# Patient Record
Sex: Female | Born: 1935 | Race: White | Hispanic: No | Marital: Married | Smoking: Never smoker
Health system: Southern US, Community
[De-identification: ages and names within clinical notes are randomized; demographics above are authoritative.]

## PROBLEM LIST (undated history)

## (undated) DIAGNOSIS — K219 Gastro-esophageal reflux disease without esophagitis: Secondary | ICD-10-CM

## (undated) DIAGNOSIS — I4719 Other supraventricular tachycardia: Secondary | ICD-10-CM

## (undated) DIAGNOSIS — K5900 Constipation, unspecified: Secondary | ICD-10-CM

## (undated) DIAGNOSIS — M1990 Unspecified osteoarthritis, unspecified site: Secondary | ICD-10-CM

## (undated) DIAGNOSIS — H3530 Unspecified macular degeneration: Secondary | ICD-10-CM

## (undated) DIAGNOSIS — R51 Headache: Secondary | ICD-10-CM

## (undated) DIAGNOSIS — Z8669 Personal history of other diseases of the nervous system and sense organs: Secondary | ICD-10-CM

## (undated) DIAGNOSIS — E039 Hypothyroidism, unspecified: Secondary | ICD-10-CM

## (undated) DIAGNOSIS — M7120 Synovial cyst of popliteal space [Baker], unspecified knee: Secondary | ICD-10-CM

## (undated) DIAGNOSIS — F0390 Unspecified dementia without behavioral disturbance: Secondary | ICD-10-CM

## (undated) DIAGNOSIS — Z8739 Personal history of other diseases of the musculoskeletal system and connective tissue: Secondary | ICD-10-CM

## (undated) DIAGNOSIS — I471 Supraventricular tachycardia: Secondary | ICD-10-CM

## (undated) DIAGNOSIS — I4891 Unspecified atrial fibrillation: Secondary | ICD-10-CM

## (undated) DIAGNOSIS — F0280 Dementia in other diseases classified elsewhere without behavioral disturbance: Secondary | ICD-10-CM

## (undated) HISTORY — PX: TONSILLECTOMY: SUR1361

## (undated) HISTORY — DX: Unspecified atrial fibrillation: I48.91

## (undated) HISTORY — PX: EXCISION MORTON'S NEUROMA: SHX5013

## (undated) HISTORY — PX: WRIST SURGERY: SHX841

## (undated) HISTORY — DX: Other supraventricular tachycardia: I47.19

## (undated) HISTORY — PX: OTHER SURGICAL HISTORY: SHX169

## (undated) HISTORY — DX: Synovial cyst of popliteal space (Baker), unspecified knee: M71.20

## (undated) HISTORY — PX: EYE SURGERY: SHX253

## (undated) HISTORY — DX: Supraventricular tachycardia: I47.1

## (undated) HISTORY — PX: DOPPLER ECHOCARDIOGRAPHY: SHX263

---

## 1998-05-14 ENCOUNTER — Other Ambulatory Visit: Admission: RE | Admit: 1998-05-14 | Discharge: 1998-05-14 | Payer: Self-pay | Admitting: Gynecology

## 1999-01-07 ENCOUNTER — Ambulatory Visit (HOSPITAL_COMMUNITY): Admission: RE | Admit: 1999-01-07 | Discharge: 1999-01-07 | Payer: Self-pay | Admitting: Cardiology

## 1999-01-07 ENCOUNTER — Encounter: Payer: Self-pay | Admitting: Cardiology

## 1999-07-09 ENCOUNTER — Encounter: Admission: RE | Admit: 1999-07-09 | Discharge: 1999-07-09 | Payer: Self-pay | Admitting: Gynecology

## 1999-07-09 ENCOUNTER — Encounter: Payer: Self-pay | Admitting: Gynecology

## 1999-08-21 ENCOUNTER — Other Ambulatory Visit: Admission: RE | Admit: 1999-08-21 | Discharge: 1999-08-21 | Payer: Self-pay | Admitting: Gynecology

## 2000-07-24 ENCOUNTER — Ambulatory Visit (HOSPITAL_COMMUNITY): Admission: RE | Admit: 2000-07-24 | Discharge: 2000-07-24 | Payer: Self-pay | Admitting: *Deleted

## 2000-09-22 ENCOUNTER — Encounter: Admission: RE | Admit: 2000-09-22 | Discharge: 2000-09-22 | Payer: Self-pay | Admitting: Gynecology

## 2000-09-22 ENCOUNTER — Encounter: Payer: Self-pay | Admitting: Gynecology

## 2000-10-12 ENCOUNTER — Other Ambulatory Visit: Admission: RE | Admit: 2000-10-12 | Discharge: 2000-10-12 | Payer: Self-pay | Admitting: Gynecology

## 2004-07-08 ENCOUNTER — Other Ambulatory Visit: Admission: RE | Admit: 2004-07-08 | Discharge: 2004-07-08 | Payer: Self-pay | Admitting: Gynecology

## 2011-08-25 ENCOUNTER — Other Ambulatory Visit: Payer: Self-pay | Admitting: Family Medicine

## 2011-08-25 DIAGNOSIS — R51 Headache: Secondary | ICD-10-CM

## 2011-08-25 DIAGNOSIS — J329 Chronic sinusitis, unspecified: Secondary | ICD-10-CM

## 2011-08-28 ENCOUNTER — Ambulatory Visit
Admission: RE | Admit: 2011-08-28 | Discharge: 2011-08-28 | Disposition: A | Discharge status: Home / Self Care | Payer: Medicare Other | Source: Ambulatory Visit | Attending: Family Medicine | Admitting: Family Medicine

## 2011-08-28 DIAGNOSIS — J329 Chronic sinusitis, unspecified: Secondary | ICD-10-CM

## 2011-08-28 DIAGNOSIS — R51 Headache: Secondary | ICD-10-CM

## 2012-01-30 ENCOUNTER — Encounter (INDEPENDENT_AMBULATORY_CARE_PROVIDER_SITE_OTHER): Payer: Medicare Other | Admitting: Ophthalmology

## 2012-01-30 DIAGNOSIS — H43819 Vitreous degeneration, unspecified eye: Secondary | ICD-10-CM

## 2012-01-30 DIAGNOSIS — H35379 Puckering of macula, unspecified eye: Secondary | ICD-10-CM

## 2012-01-30 DIAGNOSIS — H3530 Unspecified macular degeneration: Secondary | ICD-10-CM

## 2012-01-30 DIAGNOSIS — H2510 Age-related nuclear cataract, unspecified eye: Secondary | ICD-10-CM

## 2012-02-13 ENCOUNTER — Other Ambulatory Visit: Payer: Self-pay | Admitting: Family Medicine

## 2012-02-13 DIAGNOSIS — Z1231 Encounter for screening mammogram for malignant neoplasm of breast: Secondary | ICD-10-CM

## 2012-02-20 ENCOUNTER — Other Ambulatory Visit (HOSPITAL_COMMUNITY): Payer: Self-pay | Admitting: Orthopedic Surgery

## 2012-02-20 DIAGNOSIS — R102 Pelvic and perineal pain: Secondary | ICD-10-CM

## 2012-02-27 ENCOUNTER — Encounter (HOSPITAL_COMMUNITY)
Admission: RE | Admit: 2012-02-27 | Discharge: 2012-02-27 | Disposition: A | Discharge status: Home / Self Care | Payer: Medicare Other | Source: Ambulatory Visit | Attending: Orthopedic Surgery | Admitting: Orthopedic Surgery

## 2012-02-27 DIAGNOSIS — R102 Pelvic and perineal pain: Secondary | ICD-10-CM

## 2012-02-27 DIAGNOSIS — N949 Unspecified condition associated with female genital organs and menstrual cycle: Secondary | ICD-10-CM | POA: Insufficient documentation

## 2012-02-27 MED ORDER — TECHNETIUM TC 99M MEDRONATE IV KIT
25.0000 | PACK | Freq: Once | INTRAVENOUS | Status: AC | PRN
  Administered 2012-02-27: 25 via INTRAVENOUS

## 2012-03-05 ENCOUNTER — Ambulatory Visit: Payer: Medicare Other

## 2012-05-03 ENCOUNTER — Other Ambulatory Visit: Payer: Self-pay | Admitting: Family Medicine

## 2012-05-03 DIAGNOSIS — R609 Edema, unspecified: Secondary | ICD-10-CM

## 2012-05-03 DIAGNOSIS — R52 Pain, unspecified: Secondary | ICD-10-CM

## 2012-05-07 ENCOUNTER — Ambulatory Visit
Admission: RE | Admit: 2012-05-07 | Discharge: 2012-05-07 | Disposition: A | Discharge status: Home / Self Care | Payer: Medicare Other | Source: Ambulatory Visit | Attending: Family Medicine | Admitting: Family Medicine

## 2012-05-07 DIAGNOSIS — R52 Pain, unspecified: Secondary | ICD-10-CM

## 2012-05-07 DIAGNOSIS — R609 Edema, unspecified: Secondary | ICD-10-CM

## 2012-06-21 ENCOUNTER — Other Ambulatory Visit: Payer: Self-pay | Admitting: Orthopedic Surgery

## 2012-07-06 ENCOUNTER — Encounter (HOSPITAL_BASED_OUTPATIENT_CLINIC_OR_DEPARTMENT_OTHER): Payer: Self-pay | Admitting: *Deleted

## 2012-07-06 NOTE — Progress Notes (Signed)
Pt instructed npo p mn 1/15 x synthroid, prilosec w sip of water.  To [facility] 1/16@ 1030.  Needs hgb, ? ekg on arrival.  Pt states she saw Dr. Perlie Mayo at [facility] last yr for sinus arrythmia.  Last office note, most recent ekg, cardiac studies requested from Dr. Renaye Rakers office.

## 2012-07-08 ENCOUNTER — Encounter (HOSPITAL_BASED_OUTPATIENT_CLINIC_OR_DEPARTMENT_OTHER): Payer: Self-pay

## 2012-07-08 ENCOUNTER — Ambulatory Visit (HOSPITAL_BASED_OUTPATIENT_CLINIC_OR_DEPARTMENT_OTHER)
Admission: RE | Admit: 2012-07-08 | Discharge: 2012-07-08 | Disposition: A | Discharge status: Home / Self Care | Payer: Medicare Other | Source: Ambulatory Visit | Attending: Orthopedic Surgery | Admitting: Orthopedic Surgery

## 2012-07-08 ENCOUNTER — Encounter (HOSPITAL_BASED_OUTPATIENT_CLINIC_OR_DEPARTMENT_OTHER): Payer: Self-pay | Admitting: Anesthesiology

## 2012-07-08 ENCOUNTER — Encounter (HOSPITAL_BASED_OUTPATIENT_CLINIC_OR_DEPARTMENT_OTHER)
Admission: RE | Disposition: A | Discharge status: Home / Self Care | Payer: Self-pay | Source: Ambulatory Visit | Attending: Orthopedic Surgery

## 2012-07-08 ENCOUNTER — Ambulatory Visit (HOSPITAL_BASED_OUTPATIENT_CLINIC_OR_DEPARTMENT_OTHER): Payer: Medicare Other | Admitting: Anesthesiology

## 2012-07-08 DIAGNOSIS — E039 Hypothyroidism, unspecified: Secondary | ICD-10-CM | POA: Insufficient documentation

## 2012-07-08 DIAGNOSIS — Z79899 Other long term (current) drug therapy: Secondary | ICD-10-CM | POA: Insufficient documentation

## 2012-07-08 DIAGNOSIS — K219 Gastro-esophageal reflux disease without esophagitis: Secondary | ICD-10-CM | POA: Insufficient documentation

## 2012-07-08 DIAGNOSIS — M23302 Other meniscus derangements, unspecified lateral meniscus, unspecified knee: Secondary | ICD-10-CM | POA: Insufficient documentation

## 2012-07-08 DIAGNOSIS — M1710 Unilateral primary osteoarthritis, unspecified knee: Secondary | ICD-10-CM | POA: Insufficient documentation

## 2012-07-08 DIAGNOSIS — Z7982 Long term (current) use of aspirin: Secondary | ICD-10-CM | POA: Insufficient documentation

## 2012-07-08 DIAGNOSIS — Z9889 Other specified postprocedural states: Secondary | ICD-10-CM

## 2012-07-08 DIAGNOSIS — M23305 Other meniscus derangements, unspecified medial meniscus, unspecified knee: Secondary | ICD-10-CM | POA: Insufficient documentation

## 2012-07-08 HISTORY — DX: Unspecified macular degeneration: H35.30

## 2012-07-08 HISTORY — DX: Personal history of other diseases of the musculoskeletal system and connective tissue: Z87.39

## 2012-07-08 HISTORY — PX: KNEE ARTHROSCOPY WITH LATERAL MENISECTOMY: SHX6193

## 2012-07-08 HISTORY — DX: Hypothyroidism, unspecified: E03.9

## 2012-07-08 HISTORY — PX: KNEE ARTHROSCOPY WITH MEDIAL MENISECTOMY: SHX5651

## 2012-07-08 HISTORY — DX: Headache: R51

## 2012-07-08 HISTORY — DX: Constipation, unspecified: K59.00

## 2012-07-08 HISTORY — DX: Unspecified osteoarthritis, unspecified site: M19.90

## 2012-07-08 HISTORY — DX: Personal history of other diseases of the nervous system and sense organs: Z86.69

## 2012-07-08 HISTORY — DX: Gastro-esophageal reflux disease without esophagitis: K21.9

## 2012-07-08 SURGERY — ARTHROSCOPY, KNEE, WITH LATERAL MENISCECTOMY
Anesthesia: General | Site: Knee | Laterality: Right | Wound class: Clean

## 2012-07-08 MED ORDER — LIDOCAINE HCL (CARDIAC) 20 MG/ML IV SOLN
INTRAVENOUS | Status: DC | PRN
  Administered 2012-07-08: 50 mg via INTRAVENOUS

## 2012-07-08 MED ORDER — POVIDONE-IODINE 7.5 % EX SOLN
Freq: Once | CUTANEOUS | Status: DC
  Filled 2012-07-08: qty 118

## 2012-07-08 MED ORDER — PROMETHAZINE HCL 25 MG/ML IJ SOLN
6.2500 mg | INTRAMUSCULAR | Status: DC | PRN
  Filled 2012-07-08: qty 1

## 2012-07-08 MED ORDER — ONDANSETRON HCL 4 MG/2ML IJ SOLN
INTRAMUSCULAR | Status: DC | PRN
  Administered 2012-07-08: 4 mg via INTRAVENOUS

## 2012-07-08 MED ORDER — SODIUM CHLORIDE 0.9 % IR SOLN
Status: DC | PRN
  Administered 2012-07-08: 13:00:00

## 2012-07-08 MED ORDER — FENTANYL CITRATE 0.05 MG/ML IJ SOLN
25.0000 ug | INTRAMUSCULAR | Status: DC | PRN
  Administered 2012-07-08 (×2): 25 ug via INTRAVENOUS
  Filled 2012-07-08: qty 1

## 2012-07-08 MED ORDER — ACETAMINOPHEN 10 MG/ML IV SOLN
1000.0000 mg | Freq: Once | INTRAVENOUS | Status: DC | PRN
  Filled 2012-07-08: qty 100

## 2012-07-08 MED ORDER — LACTATED RINGERS IV SOLN
INTRAVENOUS | Status: DC | PRN
  Administered 2012-07-08 (×2): via INTRAVENOUS

## 2012-07-08 MED ORDER — LACTATED RINGERS IV SOLN
INTRAVENOUS | Status: DC
  Administered 2012-07-08: 11:00:00 via INTRAVENOUS
  Filled 2012-07-08: qty 1000

## 2012-07-08 MED ORDER — PROPOFOL 10 MG/ML IV BOLUS
INTRAVENOUS | Status: DC | PRN
  Administered 2012-07-08: 130 mg via INTRAVENOUS
  Administered 2012-07-08: 50 mg via INTRAVENOUS

## 2012-07-08 MED ORDER — MEPERIDINE HCL 50 MG PO TABS: 50.0000 mg | ORAL_TABLET | ORAL | Status: DC | PRN

## 2012-07-08 MED ORDER — DEXAMETHASONE SODIUM PHOSPHATE 4 MG/ML IJ SOLN
INTRAMUSCULAR | Status: DC | PRN
  Administered 2012-07-08: 4 mg via INTRAVENOUS

## 2012-07-08 MED ORDER — FENTANYL CITRATE 0.05 MG/ML IJ SOLN
INTRAMUSCULAR | Status: DC | PRN
Start: 2012-07-08 — End: 2012-07-08
  Administered 2012-07-08 (×4): 25 ug via INTRAVENOUS

## 2012-07-08 MED ORDER — MEPERIDINE HCL 25 MG/ML IJ SOLN
6.2500 mg | INTRAMUSCULAR | Status: DC | PRN
  Filled 2012-07-08: qty 1

## 2012-07-08 MED ORDER — MEPERIDINE HCL 50 MG PO TABS
50.0000 mg | ORAL_TABLET | ORAL | Status: DC | PRN
  Administered 2012-07-08: 50 mg via ORAL
  Filled 2012-07-08: qty 1

## 2012-07-08 MED ORDER — EPHEDRINE SULFATE 50 MG/ML IJ SOLN
INTRAMUSCULAR | Status: DC | PRN
  Administered 2012-07-08 (×2): 5 mg via INTRAVENOUS

## 2012-07-08 MED ORDER — BUPIVACAINE-EPINEPHRINE 0.5% -1:200000 IJ SOLN
INTRAMUSCULAR | Status: DC | PRN
  Administered 2012-07-08: 20 mL

## 2012-07-08 SURGICAL SUPPLY — 51 items
BANDAGE ELASTIC 6 VELCRO ST LF (GAUZE/BANDAGES/DRESSINGS) ×2 IMPLANT
BANDAGE ESMARK 6X9 LF (GAUZE/BANDAGES/DRESSINGS) ×1 IMPLANT
BANDAGE GAUZE ELAST BULKY 4 IN (GAUZE/BANDAGES/DRESSINGS) ×2 IMPLANT
BLADE 4.2CUDA (BLADE) IMPLANT
BLADE CUDA 5.5 (BLADE) IMPLANT
BLADE CUDA SHAVER 3.5 (BLADE) ×2 IMPLANT
BLADE CUTTER GATOR 3.5 (BLADE) IMPLANT
BLADE GREAT WHITE 4.2 (BLADE) IMPLANT
BNDG ESMARK 6X9 LF (GAUZE/BANDAGES/DRESSINGS) ×2
CANISTER SUCT LVC 12 LTR MEDI- (MISCELLANEOUS) ×4 IMPLANT
CANISTER SUCTION 1200CC (MISCELLANEOUS) ×2 IMPLANT
CLOTH BEACON ORANGE TIMEOUT ST (SAFETY) ×2 IMPLANT
DRAPE ARTHROSCOPY W/POUCH 114 (DRAPES) ×2 IMPLANT
DRAPE LG THREE QUARTER DISP (DRAPES) ×2 IMPLANT
DRSG EMULSION OIL 3X3 NADH (GAUZE/BANDAGES/DRESSINGS) ×2 IMPLANT
DRSG PAD ABDOMINAL 8X10 ST (GAUZE/BANDAGES/DRESSINGS) ×2 IMPLANT
DURAPREP 26ML APPLICATOR (WOUND CARE) ×4 IMPLANT
ELECT MENISCUS 165MM 90D (ELECTRODE) IMPLANT
ELECT REM PT RETURN 9FT ADLT (ELECTROSURGICAL)
ELECTRODE REM PT RTRN 9FT ADLT (ELECTROSURGICAL) IMPLANT
GAUZE SPONGE 4X4 12PLY STRL LF (GAUZE/BANDAGES/DRESSINGS) ×2 IMPLANT
GLOVE BIOGEL PI IND STRL 7.0 (GLOVE) ×1 IMPLANT
GLOVE BIOGEL PI IND STRL 8 (GLOVE) ×1 IMPLANT
GLOVE BIOGEL PI INDICATOR 7.0 (GLOVE) ×1
GLOVE BIOGEL PI INDICATOR 8 (GLOVE) ×1
GLOVE ECLIPSE 8.0 STRL XLNG CF (GLOVE) ×4 IMPLANT
GLOVE INDICATOR 6.5 STRL GRN (GLOVE) ×2 IMPLANT
GLOVE INDICATOR 8.0 STRL GRN (GLOVE) ×2 IMPLANT
GOWN PREVENTION PLUS LG XLONG (DISPOSABLE) ×2 IMPLANT
GOWN STRL REIN XL XLG (GOWN DISPOSABLE) ×2 IMPLANT
IV NS IRRIG 3000ML ARTHROMATIC (IV SOLUTION) ×4 IMPLANT
KNEE WRAP E Z 3 GEL PACK (MISCELLANEOUS) ×2 IMPLANT
NDL SAFETY ECLIPSE 18X1.5 (NEEDLE) ×1 IMPLANT
NEEDLE HYPO 18GX1.5 BLUNT FILL (NEEDLE) ×2 IMPLANT
NEEDLE HYPO 18GX1.5 SHARP (NEEDLE) ×1
PACK ARTHROSCOPY DSU (CUSTOM PROCEDURE TRAY) ×2 IMPLANT
PACK BASIN DAY SURGERY FS (CUSTOM PROCEDURE TRAY) ×2 IMPLANT
PADDING CAST ABS 4INX4YD NS (CAST SUPPLIES) ×1
PADDING CAST ABS COTTON 4X4 ST (CAST SUPPLIES) ×1 IMPLANT
PENCIL BUTTON HOLSTER BLD 10FT (ELECTRODE) IMPLANT
SET ARTHROSCOPY TUBING (MISCELLANEOUS) ×1
SET ARTHROSCOPY TUBING LN (MISCELLANEOUS) ×1 IMPLANT
SPONGE GAUZE 4X4 12PLY (GAUZE/BANDAGES/DRESSINGS) ×2 IMPLANT
SUT ETHIBOND 2 OS 4 DA (SUTURE) IMPLANT
SUT ETHILON 4 0 PS 2 18 (SUTURE) ×2 IMPLANT
SUT VIC AB 0 CT1 36 (SUTURE) IMPLANT
SUT VIC AB 2-0 PS2 27 (SUTURE) IMPLANT
SYRINGE 10CC LL (SYRINGE) ×2 IMPLANT
TOWEL OR 17X24 6PK STRL BLUE (TOWEL DISPOSABLE) ×4 IMPLANT
WAND 90 DEG TURBOVAC W/CORD (SURGICAL WAND) IMPLANT
WATER STERILE IRR 500ML POUR (IV SOLUTION) ×2 IMPLANT

## 2012-07-08 NOTE — H&P (Signed)
Mallory Grant is an 79 y.o. female.   Chief Complaint: painful rt knee HPI:MRI demonstrates a torn lateral meniscus  Past Medical History  Diagnosis Date  . Hypothyroidism   . GERD (gastroesophageal reflux disease)   . Arthritis   . Headache   . Macular degeneration   . History of iritis   . History of TMJ syndrome     left side  . Constipation     Past Surgical History  Procedure Date  . Tonsillectomy   . Eye surgery     bil cataracts w IOL  . Excision morton's neuroma   . Wrist surgery     History reviewed. No pertinent family history. Social History:  reports that she has never smoked. She does not have any smokeless tobacco history on file. She reports that she drinks alcohol. Her drug history not on file.  Allergies:  Allergies  Allergen Reactions  . Benadryl (Diphenhydramine Hcl) Other (See Comments)    hyperactivity  . Codeine Nausea And Vomiting  . Novocain (Procaine Hcl) Other (See Comments)    tachycardia  . Sulfa Antibiotics Nausea And Vomiting    Medications Prior to Admission  Medication Sig Dispense Refill  . aspirin 162 MG EC tablet Take 162 mg by mouth daily.      . beta carotene w/minerals (OCUVITE) tablet Take 1 tablet by mouth daily.      . calcium-vitamin D (OSCAL WITH D) 500-200 MG-UNIT per tablet Take 1 tablet by mouth daily.      . fish oil-omega-3 fatty acids 1000 MG capsule Take 2 g by mouth daily.      Marland Kitchen levothyroxine (SYNTHROID, LEVOTHROID) 75 MCG tablet Take 75 mcg by mouth daily.      . Misc Natural Products (OSTEO BI-FLEX TRIPLE STRENGTH PO) Take by mouth.      . Multiple Vitamins-Minerals (PRESERVISION AREDS 2) CAPS Take by mouth.      Marland Kitchen omeprazole (PRILOSEC) 20 MG capsule Take 20 mg by mouth daily.      . polyethylene glycol (MIRALAX / GLYCOLAX) packet Take 17 g by mouth daily.        Results for orders placed during the hospital encounter of 07/08/12 (from the past 48 hour(s))  POCT HEMOGLOBIN-HEMACUE     Status: Normal   Collection Time   07/08/12 11:15 AM      Component Value Range Comment   Hemoglobin 13.1  12.0 - 15.0 g/dL    No results found.  ROS  Blood pressure 128/66, pulse 55, temperature 97.3 F (36.3 C), temperature source Oral, resp. rate 18, height 5' 6.5" (1.689 m), weight 59.109 kg (130 lb 5 oz), SpO2 100.00%. Physical Exam  Constitutional: She is oriented to person, place, and time. She appears well-developed and well-nourished.  HENT:  Head: Normocephalic and atraumatic.  Right Ear: External ear normal.  Left Ear: External ear normal.  Nose: Nose normal.  Mouth/Throat: Oropharynx is clear and moist.  Eyes: Conjunctivae normal and EOM are normal. Pupils are equal, round, and reactive to light.  Neck: Normal range of motion. Neck supple.  Cardiovascular: Normal rate, regular rhythm, normal heart sounds and intact distal pulses.   Respiratory: Effort normal and breath sounds normal.  GI: Soft. Bowel sounds are normal.  Musculoskeletal: Normal range of motion. She exhibits tenderness.       Tender lateral joint line rt knee  Neurological: She is alert and oriented to person, place, and time. She has normal reflexes.  Skin: Skin is warm  and dry.  Psychiatric: She has a normal mood and affect. Her behavior is normal. Judgment and thought content normal.     Assessment/Plan Torn lateral meniscus rt knee Rt knee arthroscopy with partial lateral meniscectomy  Geroge Gilliam P 07/08/2012, 12:48 PM

## 2012-07-08 NOTE — Brief Op Note (Signed)
07/08/2012  4:22 PM  PATIENT:  Mallory Grant  79 y.o. female  PRE-OPERATIVE DIAGNOSIS:  * No pre-op diagnosis entered *  POST-OPERATIVE DIAGNOSIS:  * No post-op diagnosis entered *  PROCEDURE:  Procedure(s) (LRB) with comments: KNEE ARTHROSCOPY WITH LATERAL MENISECTOMY (Right) -  Partial   KNEE ARTHROSCOPY WITH MEDIAL MENISECTOMY (Right) - partial  SURGEON:  Surgeon(s) and Role:    * Drucilla Schmidt, MD - Primary  PHYSICIAN ASSISTANT:   ASSISTANTS:nurse  ANESTHESIA:   general and local  EBL:  Total I/O In: 1440 [P.O.:240; I.V.:1200] Out: -   BLOOD ADMINISTERED:none  DRAINS: none   LOCAL MEDICATIONS USED:  MARCAINE     SPECIMEN:  No Specimen  DISPOSITION OF SPECIMEN:  N/A  COUNTS:  YES  TOURNIQUET:   Total Tourniquet Time Documented: Thigh (Right) - 46 minutes  DICTATION: .Other Dictation: Dictation Number [id number]  PLAN OF CARE: Discharge to home after PACU  PATIENT DISPOSITION:  PACU - hemodynamically stable.   Delay start of Pharmacological VTE agent (>24hrs) due to surgical blood loss or risk of bleeding: yes

## 2012-07-08 NOTE — Anesthesia Postprocedure Evaluation (Signed)
  Anesthesia Post-op Note  Patient: Mallory Grant  Procedure(s) Performed: Procedure(s) (LRB): KNEE ARTHROSCOPY WITH LATERAL MENISECTOMY (Right) KNEE ARTHROSCOPY WITH MEDIAL MENISECTOMY (Right)  Patient Location: PACU  Anesthesia Type: General  Level of Consciousness: awake and alert   Airway and Oxygen Therapy: Patient Spontanous Breathing  Post-op Pain: mild  Post-op Assessment: Post-op Vital signs reviewed, Patient's Cardiovascular Status Stable, Respiratory Function Stable, Patent Airway and No signs of Nausea or vomiting  Last Vitals:  Filed Vitals:   07/08/12 1454  BP: 133/61  Pulse:   Temp:   Resp:     Post-op Vital Signs: stable   Complications: No apparent anesthesia complications

## 2012-07-08 NOTE — Anesthesia Preprocedure Evaluation (Addendum)
Anesthesia Evaluation  Patient identified by MRN, date of birth, ID band Patient awake    Reviewed: Allergy & Precautions, H&P , NPO status , Patient's Chart, lab work & pertinent test results  Airway Mallampati: I TM Distance: >3 FB Neck ROM: Full    Dental  (+) Dental Advisory Given, Teeth Intact and Caps   Pulmonary neg pulmonary ROS,  breath sounds clear to auscultation  Pulmonary exam normal       Cardiovascular - Past MI and - CHF Rhythm:Regular Rate:Normal     Neuro/Psych  Headaches, negative psych ROS   GI/Hepatic Neg liver ROS, GERD-  Medicated,  Endo/Other  Hypothyroidism   Renal/GU negative Renal ROS     Musculoskeletal  (+) Arthritis -, Osteoarthritis,    Abdominal   Peds  Hematology negative hematology ROS (+)   Anesthesia Other Findings   Reproductive/Obstetrics                          Anesthesia Physical Anesthesia Plan  ASA: II  Anesthesia Plan: General   Post-op Pain Management:    Induction: Intravenous  Airway Management Planned: LMA  Additional Equipment:   Intra-op Plan:   Post-operative Plan: Extubation in OR  Informed Consent: I have reviewed the patients History and Physical, chart, labs and discussed the procedure including the risks, benefits and alternatives for the proposed anesthesia with the patient or authorized representative who has indicated his/her understanding and acceptance.   Dental advisory given  Plan Discussed with: CRNA  Anesthesia Plan Comments:         Anesthesia Quick Evaluation

## 2012-07-08 NOTE — Transfer of Care (Signed)
Immediate Anesthesia Transfer of Care Note  Patient: Mallory Grant  Procedure(s) Performed: Procedure(s) (LRB): KNEE ARTHROSCOPY WITH LATERAL MENISECTOMY (Right) KNEE ARTHROSCOPY WITH MEDIAL MENISECTOMY (Right)  Patient Location: PACU  Anesthesia Type: General  Level of Consciousness: awake, oriented, sedated and patient cooperative  Airway & Oxygen Therapy: Patient Spontanous Breathing and Patient connected to face mask oxygen  Post-op Assessment: Report given to PACU RN and Post -op Vital signs reviewed and stable  Post vital signs: Reviewed and stable  Complications: No apparent anesthesia complications

## 2012-07-08 NOTE — Anesthesia Procedure Notes (Signed)
Procedure Name: LMA Insertion Date/Time: 07/08/2012 1:07 PM Performed by: Renella Cunas D Pre-anesthesia Checklist: Patient identified, Emergency Drugs available, Suction available and Patient being monitored Patient Re-evaluated:Patient Re-evaluated prior to inductionOxygen Delivery Method: Circle System Utilized Preoxygenation: Pre-oxygenation with 100% oxygen Intubation Type: IV induction Ventilation: Mask ventilation without difficulty LMA: LMA inserted LMA Size: 3.0 Number of attempts: 1 Airway Equipment and Method: bite block Placement Confirmation: positive ETCO2 Tube secured with: Tape Dental Injury: Teeth and Oropharynx as per pre-operative assessment

## 2012-07-09 ENCOUNTER — Encounter (HOSPITAL_BASED_OUTPATIENT_CLINIC_OR_DEPARTMENT_OTHER): Payer: Self-pay | Admitting: Orthopedic Surgery

## 2012-07-09 NOTE — Anesthesia Postprocedure Evaluation (Signed)
Anesthesia Post Note  Patient: Mallory Grant  Procedure(s) Performed: Procedure(s) (LRB): KNEE ARTHROSCOPY WITH LATERAL MENISECTOMY (Right) KNEE ARTHROSCOPY WITH MEDIAL MENISECTOMY (Right)  Anesthesia type: General  Patient location: PACU  Post pain: Pain level controlled  Post assessment: Post-op Vital signs reviewed  Last Vitals: BP 124/62  Pulse 76  Temp 35.6 C (Axillary)  Resp 20  Ht 5' 6.5" (1.689 m)  Wt 130 lb 5 oz (59.109 kg)  BMI 20.72 kg/m2  SpO2 97%  Post vital signs: Reviewed  Level of consciousness: sedated  Complications: No apparent anesthesia complications

## 2012-07-09 NOTE — Op Note (Signed)
Mallory Grant, HARE NO.:  [id number]  MEDICAL RECORD NO.:  [id number]  LOCATION:                                 FACILITY:  PHYSICIAN:  Marlowe Kays, M.D.  DATE OF BIRTH:  1929/04/10  DATE OF PROCEDURE: DATE OF DISCHARGE:                              OPERATIVE REPORT   PREOPERATIVE DIAGNOSES: 1. Torn medial meniscus. 2. Osteoarthritis, right knee.  POSTOPERATIVE DIAGNOSES: 1. Torn medial and lateral menisci. 2. Osteoarthritis, right knee.  OPERATION:  Right knee arthroscopy with partial medial and lateral meniscectomies.  SURGEON:  Marlowe Kays, M.D.  ASSISTANT:  Nurse.  ANESTHESIA:  General.  PATHOLOGY AND JUSTIFICATION FOR PROCEDURE:  Painful right knee with an MRI demonstrating a flap-type tear of the lateral meniscus as well as generalized osteoarthritis.  At surgery, she also had a tear of the posterior third of the medial meniscus as discussed below.  PROCEDURE:  Satisfactory general anesthesia, Ace wrap, and knee support to left lower extremity, pneumatic tourniquet applied to the right lower extremity with the leg Esmarch out nonsterilely and the tourniquet inflated to 250 mmHg.  Thigh stabilizer applied.  Right leg was then prepped with DuraPrep and stabilizer to the ankle and draped in sterile field.  Time-out performed.  Superior medial saline inflow.  First, an anterolateral portal, medial compartment joint was evaluated.  She had some grade 3/4 chondromalacia over a good bit of the weightbearing surface of the medial femoral condyle, and a small tear in the mid posterior portion.  I resected this back to stable rim with baskets. The medial femoral condyle did not require shaving.  Looking at the medial gutter and suprapatellar area, she had in the medial facet area, full-thickness wear and fraying elsewhere but again nothing that could be improved with shaving.  I then reversed portals, ACL was intact. Adjacent to the lateral  side was a large flap of anterior lateral meniscus as well as what appeared to be some ligamentum mucosa.  All this was getting trapped in the joint.  I then resected this back with a combination of baskets and the 3.5 shaver.  On cleaning up the anterior third of the lateral meniscus, I noted that it was quite inflamed over its synovial attachment and in fact had detached there.  I could place a nerve hook/probe through it on its base.  I then resected the anterior third with a combination of scissors and the 3.5 shaver.  Looking more posteriorly, she had wear in the form of tearing throughout the entire remainder of the lateral meniscus which I shaved down until smooth and debrided out the junction of the anterior third of the lateral meniscus as well.  She had full-thickness wear over a good bit of the lateral femoral condyle which did not require shaving.  Knee joint was then irrigated to clear and all fluid possibly removed.  I closed the 2 entry portals with 4-0 nylon and then injected 20 mL of 0.5% Marcaine through the inflow apparatus which was removed.  This portal was closed with 4-0 nylon as well.  Betadine, Adaptic, dry sterile dressing were applied.  Tourniquet was released.  She tolerated the  procedure well.  At the time of this dictation, she was on her way to the recovery room with no known complications.          ______________________________ Marlowe Kays, M.D.     JA/MEDQ  D:  07/08/2012  T:  07/09/2012  Job:  [id number]

## 2012-08-02 ENCOUNTER — Ambulatory Visit (INDEPENDENT_AMBULATORY_CARE_PROVIDER_SITE_OTHER): Payer: Medicare Other | Admitting: Ophthalmology

## 2012-08-02 DIAGNOSIS — H35379 Puckering of macula, unspecified eye: Secondary | ICD-10-CM

## 2012-08-02 DIAGNOSIS — H3530 Unspecified macular degeneration: Secondary | ICD-10-CM

## 2012-08-02 DIAGNOSIS — H43819 Vitreous degeneration, unspecified eye: Secondary | ICD-10-CM

## 2013-03-16 ENCOUNTER — Encounter: Payer: Self-pay | Admitting: Cardiovascular Disease

## 2013-03-16 ENCOUNTER — Ambulatory Visit (INDEPENDENT_AMBULATORY_CARE_PROVIDER_SITE_OTHER): Payer: Medicare Other | Admitting: Cardiovascular Disease

## 2013-03-16 VITALS — BP 110/70 | HR 64 | Ht 66.0 in | Wt 134.2 lb

## 2013-03-16 DIAGNOSIS — I4891 Unspecified atrial fibrillation: Secondary | ICD-10-CM

## 2013-03-16 DIAGNOSIS — R079 Chest pain, unspecified: Secondary | ICD-10-CM

## 2013-03-16 DIAGNOSIS — E039 Hypothyroidism, unspecified: Secondary | ICD-10-CM

## 2013-03-16 DIAGNOSIS — I480 Paroxysmal atrial fibrillation: Secondary | ICD-10-CM

## 2013-03-16 DIAGNOSIS — R0789 Other chest pain: Secondary | ICD-10-CM

## 2013-03-16 NOTE — Assessment & Plan Note (Signed)
This has all the hallmarks of exertional angina pectoris. Roughly 2 years ago she had a nuclear stress test that was normal, but at that time did not have the same chest tightness symptoms. Her echocardiogram showed normal left ventricle systolic function in January 2013. Does not have aortic stenosis. I have recommended we repeat the treadmill nuclear stress test.

## 2013-03-16 NOTE — Patient Instructions (Addendum)
Your physician has requested that you have an exercise stress myoview. For further information please visit [email or web address] Please follow instruction sheet, as given.  Your physician recommends that you schedule a follow-up appointment in: 1 year  If the stress test is abnormal we will give you a call about a sooner appointment. You should be notified about the results of the stress test within 3-5 business days after the test is performed. Please call the office if you are not called.

## 2013-03-16 NOTE — Assessment & Plan Note (Signed)
This appears to be asymptomatic at this time. I am not sure whether she no longer notices the arrhythmia because it is occurring with lower incidence or because it is occurring at a slower ventricular rate. Her risk of stroke is low and she is therefore on aspirin for stroke prophylaxis. Vascular disease (such as CAD) is identified in her workup, this would move her to a high-risk category (may have to consider warfarin or a novel anticoagulant).

## 2013-03-16 NOTE — Progress Notes (Signed)
Patient ID: Mallory Grant, female   DOB: 27-Jan-1929, 79 y.o.   MRN: [id number]     Reason for office visit Exertional chest pain  Mallory Grant is almost 79 years old and has a history of "lone" paroxysmal atrial fibrillation that responded well from a symptomatic standpoint to treatment with a low dose of beta blocker. She now presents with a different complaint. While mowing the lawn on a couple of occasions she has developed chest tightness that made her slow down. The chest discomfort resolved with rest. She has had one other episode of chest discomfort that occurred without any precipitant (while she was sitting in a chair). This last event had a different quality and was sharper in nature. It lasted less than 15 minutes.  Emotionally, she is having difficulty coping with the slow deterioration of her husband's memory. Their son is helping them move into a smaller home where there'll be fewer yard work type tasks for them to handle.      Allergies  Allergen Reactions  . Benadryl [Diphenhydramine Hcl] Other (See Comments)    hyperactivity  . Codeine Nausea And Vomiting  . Novocain [Procaine Hcl] Other (See Comments)    tachycardia  . Sulfa Antibiotics Nausea And Vomiting    Current Outpatient Prescriptions  Medication Sig Dispense Refill  . aspirin 162 MG EC tablet Take 162 mg by mouth daily.      . beta carotene w/minerals (OCUVITE) tablet Take 1 tablet by mouth daily.      . calcium-vitamin D (OSCAL WITH D) 500-200 MG-UNIT per tablet Take 1 tablet by mouth daily.      Marland Kitchen escitalopram (LEXAPRO) 5 MG tablet Take 5 mg by mouth daily.      . fish oil-omega-3 fatty acids 1000 MG capsule Take 2 g by mouth daily.      Marland Kitchen levothyroxine (SYNTHROID, LEVOTHROID) 75 MCG tablet Take 75 mcg by mouth daily.      . magnesium oxide (MAG-OX) 400 MG tablet Take 400 mg by mouth daily.      . Misc Natural Products (OSTEO BI-FLEX TRIPLE STRENGTH PO) Take by mouth.      . Multiple Vitamins-Minerals  (PRESERVISION AREDS 2) CAPS Take by mouth.      Marland Kitchen omeprazole (PRILOSEC) 20 MG capsule Take 20 mg by mouth daily.      . polyethylene glycol (MIRALAX / GLYCOLAX) packet Take 17 g by mouth daily.       No current facility-administered medications for this visit.    Past Medical History  Diagnosis Date  . Hypothyroidism   . GERD (gastroesophageal reflux disease)   . Arthritis   . Headache(784.0)   . Macular degeneration   . History of iritis   . History of TMJ syndrome     left side  . Constipation     Past Surgical History  Procedure Laterality Date  . Tonsillectomy    . Eye surgery      bil cataracts w IOL  . Excision morton's neuroma    . Wrist surgery    . Knee arthroscopy with lateral menisectomy  07/08/2012    Procedure: KNEE ARTHROSCOPY WITH LATERAL MENISECTOMY;  Surgeon: Drucilla Schmidt, MD;  Location: [facility];  Service: Orthopedics;  Laterality: Right;   Partial    . Knee arthroscopy with medial menisectomy  07/08/2012    Procedure: KNEE ARTHROSCOPY WITH MEDIAL MENISECTOMY;  Surgeon: Drucilla Schmidt, MD;  Location: [facility];  Service: Orthopedics;  Laterality: Right;  partial    No family history on file.  History   Social History  . Marital Status: Married    Spouse Name: N/A    Number of Children: N/A  . Years of Education: N/A   Occupational History  . Not on file.   Social History Main Topics  . Smoking status: Never Smoker   . Smokeless tobacco: Not on file  . Alcohol Use: Yes     Comment: rare  . Drug Use:   . Sexual Activity:    Other Topics Concern  . Not on file   Social History Narrative  . No narrative on file    Review of systems: The patient specifically denies dyspnea at rest or with exertion, orthopnea, paroxysmal nocturnal dyspnea, syncope, palpitations, focal neurological deficits, intermittent claudication, lower extremity edema, unexplained weight gain, cough, hemoptysis or wheezing.  The  patient also denies abdominal pain, nausea, vomiting, dysphagia, diarrhea, constipation, polyuria, polydipsia, dysuria, hematuria, frequency, urgency, abnormal bleeding or bruising, fever, chills, unexpected weight changes, mood swings, change in skin or hair texture, change in voice quality, auditory or visual problems, allergic reactions or rashes, new musculoskeletal complaints other than usual "aches and pains".   PHYSICAL EXAM BP 110/70  Pulse 64  Ht 5\' 6"  (1.676 m)  Wt 134 lb 3.2 oz (60.873 kg)  BMI 21.67 kg/m2  General: Alert, oriented x3, no distress Head: no evidence of trauma, PERRL, EOMI, no exophtalmos or lid lag, no myxedema, no xanthelasma; normal ears, nose and oropharynx Neck: normal jugular venous pulsations and no hepatojugular reflux; brisk carotid pulses without delay and no carotid bruits Chest: clear to auscultation, no signs of consolidation by percussion or palpation, normal fremitus, symmetrical and full respiratory excursions Cardiovascular: normal position and quality of the apical impulse, regular rhythm, normal first and second heart sounds, no murmurs, rubs or gallops Abdomen: no tenderness or distention, no masses by palpation, no abnormal pulsatility or arterial bruits, normal bowel sounds, no hepatosplenomegaly Extremities: no clubbing, cyanosis or edema; 2+ radial, ulnar and brachial pulses bilaterally; 2+ right femoral, posterior tibial and dorsalis pedis pulses; 2+ left femoral, posterior tibial and dorsalis pedis pulses; no subclavian or femoral bruits Neurological: grossly nonfocal   EKG: Normal sinus rhythm  August 2013 Cholesterol 210, triglycerides 84, HDL 72, LDL 122, TSH 0.6, liver function tests normal, creatinine 0.86, hemoglobin 13.7  ASSESSMENT AND PLAN Chest pain This has all the hallmarks of exertional angina pectoris. Roughly 2 years ago she had a nuclear stress test that was normal, but at that time did not have the same chest tightness  symptoms. Her echocardiogram showed normal left ventricle systolic function in January 2013. Does not have aortic stenosis. I have recommended we repeat the treadmill nuclear stress test.  Paroxysmal atrial fibrillation This appears to be asymptomatic at this time. I am not sure whether she no longer notices the arrhythmia because it is occurring with lower incidence or because it is occurring at a slower ventricular rate. Her risk of stroke is low and she is therefore on aspirin for stroke prophylaxis. Vascular disease (such as CAD) is identified in her workup, this would move her to a high-risk category (may have to consider warfarin or a novel anticoagulant).  Hypothyroidism No clinical signs or symptoms of hyper thyroidism the most recent TSH that I have on file is 39-year-old and was completely normal. She believes she had a more recent thyroid assay with Dr. Tiburcio Pea.  Orders Placed This Encounter  Procedures  .  Myocardial Perfusion Imaging  . EKG 12-Lead   Meds ordered this encounter  Medications  . escitalopram (LEXAPRO) 5 MG tablet    Sig: Take 5 mg by mouth daily.  . magnesium oxide (MAG-OX) 400 MG tablet    Sig: Take 400 mg by mouth daily.    Junious Silk, MD, [facility] [phone] office [phone] pager

## 2013-03-16 NOTE — Assessment & Plan Note (Signed)
No clinical signs or symptoms of hyper thyroidism the most recent TSH that I have on file is 79-year-old and was completely normal. She believes she had a more recent thyroid assay with Dr. Tiburcio Pea.

## 2013-03-30 ENCOUNTER — Ambulatory Visit (HOSPITAL_COMMUNITY)
Admission: RE | Admit: 2013-03-30 | Discharge: 2013-03-30 | Disposition: A | Discharge status: Home / Self Care | Payer: Medicare Other | Source: Ambulatory Visit | Attending: Cardiovascular Disease | Admitting: Cardiovascular Disease

## 2013-03-30 DIAGNOSIS — R079 Chest pain, unspecified: Secondary | ICD-10-CM | POA: Insufficient documentation

## 2013-03-30 DIAGNOSIS — R002 Palpitations: Secondary | ICD-10-CM | POA: Insufficient documentation

## 2013-03-30 DIAGNOSIS — R0789 Other chest pain: Secondary | ICD-10-CM

## 2013-03-30 MED ORDER — TECHNETIUM TC 99M SESTAMIBI GENERIC - CARDIOLITE
30.5000 | Freq: Once | INTRAVENOUS | Status: AC | PRN
  Administered 2013-03-30: 31 via INTRAVENOUS

## 2013-03-30 MED ORDER — TECHNETIUM TC 99M SESTAMIBI GENERIC - CARDIOLITE
10.3000 | Freq: Once | INTRAVENOUS | Status: AC | PRN
  Administered 2013-03-30: 10 via INTRAVENOUS

## 2013-03-30 NOTE — Procedures (Addendum)
[place] [facility] [address] [place] [address] [place] [address] [phone]  Cardiology Nuclear Med Study  Mallory Grant is a 79 y.o. female     MRN : [id number]     DOB: 1928-07-01  Procedure Date: 03/30/2013  Nuclear Med Background Indication for Stress Test:  Evaluation for Ischemia and Abnormal EKG History:  Enlarged L ventricle Cardiac Risk Factors: PAF  Symptoms:  Chest Pain and Palpitations   Nuclear Pre-Procedure Caffeine/Decaff Intake:  12:00am NPO After: 10 am   IV Site: R Forearm  IV 0.9% NS with Angio Cath:  22g  Chest Size (in):  n/a IV Started by: Emmit Pomfret, RN  Height: 5\' 6"  (1.676 m)  Cup Size: A  BMI:  Body mass index is 21.64 kg/(m^2). Weight:  134 lb (60.782 kg)   Tech Comments:  n/a    Nuclear Med Study 1 or 2 day study: 1 day  Stress Test Type:  Stress  Order Authorizing Provider:  Thurmon Fair, MD    Resting Radionuclide: Technetium 59m Sestamibi  Resting Radionuclide Dose: 10.3 mCi   Stress Radionuclide:  Technetium 46m Sestamibi  Stress Radionuclide Dose: 30.5 mCi           Stress Protocol Rest HR: 77 Stress HR:  144  Rest BP:  153/98 Stress BP:  177/92  Exercise Time (min): 5:26 METS: 7.0   Predicted Max HR: 139 bpm % Max HR: 103.6 bpm Rate Pressure Product: 41324  Dose of Adenosine (mg):  n/a Dose of Lexiscan: n/a mg  Dose of Atropine (mg): n/a Dose of Dobutamine: n/a mcg/kg/min (at max HR)  Stress Test Technologist: Esperanza Sheets, CCT Nuclear Technologist: Gonzella Lex, CNMT   Rest Procedure:  Myocardial perfusion imaging was performed at rest 45 minutes following the intravenous administration of Technetium 34m Sestamibi. Stress Procedure:  The patient performed treadmill exercise using a Bruce  Protocol for 5:23 minutes. The patient stopped due to SOB and Fatigue and denied any chest pain.  There were significant ST-T wave changes.  Technetium 68m Sestamibi was injected at peak exercise  and myocardial perfusion imaging was performed after a brief delay.  Transient Ischemic Dilatation (Normal <1.22):  0.97 Lung/Heart Ratio (Normal <0.45):  0.32 QGS EDV:  62 ml QGS ESV:  18 ml LV Ejection Fraction: 72%  Rest ECG: NSR - Normal EKG  Stress ECG: Significant ST abnormalities consistent with ischemia.  QPS Raw Data Images:  Mild diaphragmatic attenuation.  Normal left ventricular size. Stress Images:  There is decreased uptake in the anterior wall. Rest Images:  Normal homogeneous uptake in all areas of the myocardium. Subtraction (SDS):  Small area of anteroseptal reversibility.  Impression Exercise Capacity:  Poor exercise capacity. BP Response:  Normal blood pressure response. Clinical Symptoms:  Typical chest pain. ECG Impression:  Significant ST abnormalities consistent with ischemia. Comparison with Prior Nuclear Study: EKG changes and symptoms consistent with ischemia. Review of the rhythm strips from her prior stress test in 2013, showed an electrically positive study with no perfusion abnormality.  Overall Impression:  Intermediate risk stress nuclear study, based on small reversible anterior perfusion defect. However, there are significant 4 mm horizontal ST depressions in II, III, AVF and V5-6, with 2 mm ST elevation in AVL. She experienced chest tightness during the study.  Electrically positive for ischemia with typical anginal symptoms and poor exercise tolerance.  Consider cardiac catheterization.  LV Wall Motion:  NL LV Function; NL Wall Motion; EF 72%  Mallory Grant  C. Rennis Golden, MD, [facility] Board Certified in Nuclear Cardiology Attending Cardiologist [facility]  Chrystie Nose, MD  03/30/2013 4:00 PM

## 2013-04-01 NOTE — Progress Notes (Signed)
appt 04/04/13

## 2013-04-04 ENCOUNTER — Ambulatory Visit (INDEPENDENT_AMBULATORY_CARE_PROVIDER_SITE_OTHER): Payer: Medicare Other | Admitting: Cardiovascular Disease

## 2013-04-04 ENCOUNTER — Encounter: Payer: Self-pay | Admitting: Cardiovascular Disease

## 2013-04-04 VITALS — BP 128/76 | HR 72 | Resp 16 | Ht 66.0 in | Wt 133.9 lb

## 2013-04-04 DIAGNOSIS — I480 Paroxysmal atrial fibrillation: Secondary | ICD-10-CM

## 2013-04-04 DIAGNOSIS — Z79899 Other long term (current) drug therapy: Secondary | ICD-10-CM

## 2013-04-04 DIAGNOSIS — E785 Hyperlipidemia, unspecified: Secondary | ICD-10-CM

## 2013-04-04 DIAGNOSIS — R079 Chest pain, unspecified: Secondary | ICD-10-CM

## 2013-04-04 DIAGNOSIS — I4891 Unspecified atrial fibrillation: Secondary | ICD-10-CM

## 2013-04-04 DIAGNOSIS — D689 Coagulation defect, unspecified: Secondary | ICD-10-CM

## 2013-04-04 DIAGNOSIS — R5381 Other malaise: Secondary | ICD-10-CM

## 2013-04-04 LAB — CBC
HCT: 39.6 % (ref 36.0–46.0)
Hemoglobin: 13.3 g/dL (ref 12.0–15.0)
MCH: 30.7 pg (ref 26.0–34.0)
MCHC: 33.6 g/dL (ref 30.0–36.0)

## 2013-04-04 NOTE — Patient Instructions (Signed)
Your physician has requested that you have a cardiac catheterization right radial approach. Cardiac catheterization is used to diagnose and/or treat various heart conditions. Doctors may recommend this procedure for a number of different reasons. The most common reason is to evaluate chest pain. Chest pain can be a symptom of coronary artery disease (CAD), and cardiac catheterization can show whether plaque is narrowing or blocking your heart's arteries. This procedure is also used to evaluate the valves, as well as measure the blood flow and oxygen levels in different parts of your heart. For further information please visit [email or web address] Please follow instruction sheet, as given.  You need to get lab work prior to the cardiac cath -  5-7 days before the date of the cath.

## 2013-04-05 ENCOUNTER — Other Ambulatory Visit: Payer: Self-pay | Admitting: *Deleted

## 2013-04-05 DIAGNOSIS — R9431 Abnormal electrocardiogram [ECG] [EKG]: Secondary | ICD-10-CM

## 2013-04-05 LAB — COMPREHENSIVE METABOLIC PANEL
Albumin: 4.4 g/dL (ref 3.5–5.2)
Alkaline Phosphatase: 104 U/L (ref 39–117)
BUN: 16 mg/dL (ref 6–23)
Glucose, Bld: 89 mg/dL (ref 70–99)
Potassium: 4.5 mEq/L (ref 3.5–5.3)

## 2013-04-05 LAB — APTT: aPTT: 30 seconds (ref 24–37)

## 2013-04-05 LAB — PROTIME-INR
INR: 0.89 (ref <1.50)
Prothrombin Time: 12.1 seconds (ref 11.6–15.2)

## 2013-04-06 ENCOUNTER — Encounter: Payer: Self-pay | Admitting: Cardiovascular Disease

## 2013-04-06 DIAGNOSIS — E785 Hyperlipidemia, unspecified: Secondary | ICD-10-CM | POA: Insufficient documentation

## 2013-04-06 NOTE — Assessment & Plan Note (Signed)
Currently asymptomatic. Since she has not had previous documentation of vascular disease she is only on aspirin for prophylaxis. If we do find significant coronary atherosclerosis this would move her chadsvasc score up to a range where she would need full anticoagulation.

## 2013-04-06 NOTE — Assessment & Plan Note (Addendum)
She had the recurrence of her anginal symptoms during the treadmill exercise stress test and had very severe ST segment depression. By contrast the nuclear perfusion abnormality was a very small reversible defect, involving the anterior wall. Note that she had a abnormal ECG response on a previous treadmill stress test in January 2013, but had normal perfusion images at that time. I am most impressed by the clinical reproduction of her symptoms with exercise on the treadmill and in concerned that she may have true CAD, recently becoming symptomatic and therefore consistent with unstable angina. I've recommended that she undergo coronary angiography and possible revascularization. We discussed the risks and benefits of this procedure she understands and agrees to proceed. She will have to make arrangements for somebody to spend a day or 2 supervising her husband.

## 2013-04-06 NOTE — Assessment & Plan Note (Signed)
Similarly, if true CAD is identified we will recommend statin therapy for mildly elevated LDL cholesterol.

## 2013-04-06 NOTE — Progress Notes (Signed)
Patient ID: Mallory Grant, female   DOB: 01/01/1929, 79 y.o.   MRN: [id number]      Reason for office visit Abnormal stress test   Allergies  Allergen Reactions  . Benadryl [Diphenhydramine Hcl] Other (See Comments)    hyperactivity  . Codeine Nausea And Vomiting  . Novocain [Procaine Hcl] Other (See Comments)    tachycardia  . Sulfa Antibiotics Nausea And Vomiting    Current Outpatient Prescriptions  Medication Sig Dispense Refill  . aspirin 162 MG EC tablet Take 162 mg by mouth daily.      . beta carotene w/minerals (OCUVITE) tablet Take 1 tablet by mouth daily.      . calcium-vitamin D (OSCAL WITH D) 500-200 MG-UNIT per tablet Take 1 tablet by mouth daily.      . escitalopram (LEXAPRO) 5 MG tablet Take 5 mg by mouth daily.      . fish oil-omega-3 fatty acids 1000 MG capsule Take 2 g by mouth daily.      . levothyroxine (SYNTHROID, LEVOTHROID) 75 MCG tablet Take 75 mcg by mouth daily.      . magnesium oxide (MAG-OX) 400 MG tablet Take 400 mg by mouth daily.      . Misc Natural Products (OSTEO BI-FLEX TRIPLE STRENGTH PO) Take by mouth.      . Multiple Vitamins-Minerals (PRESERVISION AREDS 2) CAPS Take by mouth.      . omeprazole (PRILOSEC) 20 MG capsule Take 20 mg by mouth daily.      . polyethylene glycol (MIRALAX / GLYCOLAX) packet Take 17 g by mouth daily.       No current facility-administered medications for this visit.    Past Medical History  Diagnosis Date  . Hypothyroidism   . GERD (gastroesophageal reflux disease)   . Arthritis   . Headache(784.0)   . Macular degeneration   . History of iritis   . History of TMJ syndrome     left side  . Constipation     Past Surgical History  Procedure Laterality Date  . Tonsillectomy    . Eye surgery      bil cataracts w IOL  . Excision morton's neuroma    . Wrist surgery    . Knee arthroscopy with lateral menisectomy  07/08/2012    Procedure: KNEE ARTHROSCOPY WITH LATERAL MENISECTOMY;  Surgeon: James P Aplington,  MD;  Location: [facility];  Service: Orthopedics;  Laterality: Right;   Partial    . Knee arthroscopy with medial menisectomy  07/08/2012    Procedure: KNEE ARTHROSCOPY WITH MEDIAL MENISECTOMY;  Surgeon: James P Aplington, MD;  Location:  SURGERY CENTER;  Service: Orthopedics;  Laterality: Right;  partial    No family history on file.  History   Social History  . Marital Status: Married    Spouse Name: N/A    Number of Children: N/A  . Years of Education: N/A   Occupational History  . Not on file.   Social History Main Topics  . Smoking status: Never Smoker   . Smokeless tobacco: Not on file  . Alcohol Use: Yes     Comment: rare  . Drug Use:   . Sexual Activity:    Other Topics Concern  . Not on file   Social History Narrative  . No narrative on file    Review of systems: Abnormal nuclear stress test  PHYSICAL EXAM BP 128/76  Pulse 72  Resp 16  Ht 5' 6" (1.676 m)  Wt 133   lb 14.4 oz (60.737 kg)  BMI 21.62 kg/m2  General: Alert, oriented x3, no distress Head: no evidence of trauma, PERRL, EOMI, no exophtalmos or lid lag, no myxedema, no xanthelasma; normal ears, nose and oropharynx Neck: normal jugular venous pulsations and no hepatojugular reflux; brisk carotid pulses without delay and no carotid bruits Chest: clear to auscultation, no signs of consolidation by percussion or palpation, normal fremitus, symmetrical and full respiratory excursions Cardiovascular: normal position and quality of the apical impulse, regular rhythm, normal first and second heart sounds, no murmurs, rubs or gallops Abdomen: no tenderness or distention, no masses by palpation, no abnormal pulsatility or arterial bruits, normal bowel sounds, no hepatosplenomegaly Extremities: no clubbing, cyanosis or edema; 2+ radial, ulnar and brachial pulses bilaterally; 2+ right femoral, posterior tibial and dorsalis pedis pulses; 2+ left femoral, posterior tibial and dorsalis  pedis pulses; no subclavian or femoral bruits Neurological: grossly nonfocal   EKG: NSR, QS V1-V2  Lipid Panel  August 2013 total cholesterol 210, triglycerides 84, HDL 72, LDL 122  BMET    Component Value Date/Time   NA 134* 04/04/2013 1458   K 4.5 04/04/2013 1458   CL 98 04/04/2013 1458   CO2 31 04/04/2013 1458   GLUCOSE 89 04/04/2013 1458   BUN 16 04/04/2013 1458   CREATININE 0.83 04/04/2013 1458   CALCIUM 9.8 04/04/2013 1458     ASSESSMENT AND PLAN Chest pain with abnormal stress test She had the recurrence of her anginal symptoms during the treadmill exercise stress test and had very severe ST segment depression. By contrast the nuclear perfusion abnormality was a very small reversible defect, involving the anterior wall. Note that she had a abnormal ECG response on a previous treadmill stress test in January 2013, but had normal perfusion images at that time. I am most impressed by the clinical reproduction of her symptoms with exercise on the treadmill and in concerned that she may have true CAD, recently becoming symptomatic and therefore consistent with unstable angina. I've recommended that she undergo coronary angiography and possible revascularization. We discussed the risks and benefits of this procedure she understands and agrees to proceed. She will have to make arrangements for somebody to spend a day or 2 supervising her husband.  Paroxysmal atrial fibrillation Currently asymptomatic. Since she has not had previous documentation of vascular disease she is only on aspirin for prophylaxis. If we do find significant coronary atherosclerosis this would move her chadsvasc score up to a range where she would need full anticoagulation.  Hyperlipidemia Similarly, if true CAD is identified we will recommend statin therapy for mildly elevated LDL cholesterol.   Orders Placed This Encounter  Procedures  . CBC  . Comp Met (CMET)  . INR/PT  . PTT  . LEFT HEART  CATHETERIZATION WITH CORONARY ANGIOGRAM   No orders of the defined types were placed in this encounter.    [name]  Amara Justen, MD, FACC [facility] [phone] office [phone] pager   

## 2013-04-08 ENCOUNTER — Encounter (HOSPITAL_COMMUNITY)
Admission: RE | Disposition: A | Discharge status: Home / Self Care | Payer: Self-pay | Source: Ambulatory Visit | Attending: Cardiovascular Disease

## 2013-04-08 ENCOUNTER — Ambulatory Visit (HOSPITAL_COMMUNITY)
Admission: RE | Admit: 2013-04-08 | Discharge: 2013-04-08 | Disposition: A | Discharge status: Home / Self Care | Payer: Medicare Other | Source: Ambulatory Visit | Attending: Cardiovascular Disease | Admitting: Cardiovascular Disease

## 2013-04-08 DIAGNOSIS — Z79899 Other long term (current) drug therapy: Secondary | ICD-10-CM | POA: Insufficient documentation

## 2013-04-08 DIAGNOSIS — R9431 Abnormal electrocardiogram [ECG] [EKG]: Secondary | ICD-10-CM

## 2013-04-08 DIAGNOSIS — R9439 Abnormal result of other cardiovascular function study: Secondary | ICD-10-CM | POA: Insufficient documentation

## 2013-04-08 DIAGNOSIS — E785 Hyperlipidemia, unspecified: Secondary | ICD-10-CM | POA: Insufficient documentation

## 2013-04-08 DIAGNOSIS — R079 Chest pain, unspecified: Secondary | ICD-10-CM | POA: Insufficient documentation

## 2013-04-08 DIAGNOSIS — I4891 Unspecified atrial fibrillation: Secondary | ICD-10-CM | POA: Insufficient documentation

## 2013-04-08 HISTORY — PX: LEFT HEART CATHETERIZATION WITH CORONARY ANGIOGRAM: SHX5451

## 2013-04-08 SURGERY — LEFT HEART CATHETERIZATION WITH CORONARY ANGIOGRAM
Anesthesia: LOCAL

## 2013-04-08 MED ORDER — LIDOCAINE HCL (PF) 1 % IJ SOLN
INTRAMUSCULAR | Status: AC
  Filled 2013-04-08: qty 30

## 2013-04-08 MED ORDER — ACETAMINOPHEN 325 MG PO TABS: 650.0000 mg | ORAL_TABLET | ORAL | Status: DC | PRN

## 2013-04-08 MED ORDER — FENTANYL CITRATE 0.05 MG/ML IJ SOLN
INTRAMUSCULAR | Status: AC
  Filled 2013-04-08: qty 2

## 2013-04-08 MED ORDER — HEPARIN (PORCINE) IN NACL 2-0.9 UNIT/ML-% IJ SOLN
INTRAMUSCULAR | Status: AC
  Filled 2013-04-08: qty 1000

## 2013-04-08 MED ORDER — HEPARIN SODIUM (PORCINE) 1000 UNIT/ML IJ SOLN
INTRAMUSCULAR | Status: AC
  Filled 2013-04-08: qty 1

## 2013-04-08 MED ORDER — MIDAZOLAM HCL 2 MG/2ML IJ SOLN
INTRAMUSCULAR | Status: AC
  Filled 2013-04-08: qty 2

## 2013-04-08 MED ORDER — SODIUM CHLORIDE 0.9 % IJ SOLN: 3.0000 mL | INTRAMUSCULAR | Status: DC | PRN

## 2013-04-08 MED ORDER — NITROGLYCERIN 0.2 MG/ML ON CALL CATH LAB
INTRAVENOUS | Status: AC
  Filled 2013-04-08: qty 1

## 2013-04-08 MED ORDER — DIAZEPAM 5 MG PO TABS
5.0000 mg | ORAL_TABLET | ORAL | Status: AC
  Administered 2013-04-08: 5 mg via ORAL
  Filled 2013-04-08: qty 1

## 2013-04-08 MED ORDER — SODIUM CHLORIDE 0.9 % IV SOLN
INTRAVENOUS | Status: DC
  Administered 2013-04-08: 75 mL/h via INTRAVENOUS

## 2013-04-08 MED ORDER — SODIUM CHLORIDE 0.9 % IV SOLN: 1.0000 mL/kg/h | INTRAVENOUS | Status: DC

## 2013-04-08 MED ORDER — ASPIRIN 81 MG PO CHEW
324.0000 mg | CHEWABLE_TABLET | Freq: Once | ORAL | Status: AC
  Administered 2013-04-08: 324 mg via ORAL
  Filled 2013-04-08: qty 4

## 2013-04-08 MED ORDER — ONDANSETRON HCL 4 MG/2ML IJ SOLN: 4.0000 mg | Freq: Four times a day (QID) | INTRAMUSCULAR | Status: DC | PRN

## 2013-04-08 MED ORDER — VERAPAMIL HCL 2.5 MG/ML IV SOLN
INTRAVENOUS | Status: AC
  Filled 2013-04-08: qty 2

## 2013-04-08 NOTE — Interval H&P Note (Signed)
History and Physical Interval Note:  04/08/2013 9:14 AM  Mallory Grant  has presented today for surgery, with the diagnosis of cad  The various methods of treatment have been discussed with the patient and family. After consideration of risks, benefits and other options for treatment, the patient has consented to  Procedure(s): LEFT HEART CATHETERIZATION WITH CORONARY ANGIOGRAM (N/A) as a surgical intervention .  The patient's history has been reviewed, patient examined, no change in status, stable for surgery.  I have reviewed the patient's chart and labs.  Questions were answered to the patient's satisfaction.     [name]

## 2013-04-08 NOTE — H&P (View-Only) (Signed)
Patient ID: Mallory Grant, female   DOB: 11-13-1928, 79 y.o.   MRN: [id number]      Reason for office visit Abnormal stress test   Allergies  Allergen Reactions  . Benadryl [Diphenhydramine Hcl] Other (See Comments)    hyperactivity  . Codeine Nausea And Vomiting  . Novocain [Procaine Hcl] Other (See Comments)    tachycardia  . Sulfa Antibiotics Nausea And Vomiting    Current Outpatient Prescriptions  Medication Sig Dispense Refill  . aspirin 162 MG EC tablet Take 162 mg by mouth daily.      . beta carotene w/minerals (OCUVITE) tablet Take 1 tablet by mouth daily.      . calcium-vitamin D (OSCAL WITH D) 500-200 MG-UNIT per tablet Take 1 tablet by mouth daily.      Marland Kitchen escitalopram (LEXAPRO) 5 MG tablet Take 5 mg by mouth daily.      . fish oil-omega-3 fatty acids 1000 MG capsule Take 2 g by mouth daily.      Marland Kitchen levothyroxine (SYNTHROID, LEVOTHROID) 75 MCG tablet Take 75 mcg by mouth daily.      . magnesium oxide (MAG-OX) 400 MG tablet Take 400 mg by mouth daily.      . Misc Natural Products (OSTEO BI-FLEX TRIPLE STRENGTH PO) Take by mouth.      . Multiple Vitamins-Minerals (PRESERVISION AREDS 2) CAPS Take by mouth.      Marland Kitchen omeprazole (PRILOSEC) 20 MG capsule Take 20 mg by mouth daily.      . polyethylene glycol (MIRALAX / GLYCOLAX) packet Take 17 g by mouth daily.       No current facility-administered medications for this visit.    Past Medical History  Diagnosis Date  . Hypothyroidism   . GERD (gastroesophageal reflux disease)   . Arthritis   . Headache(784.0)   . Macular degeneration   . History of iritis   . History of TMJ syndrome     left side  . Constipation     Past Surgical History  Procedure Laterality Date  . Tonsillectomy    . Eye surgery      bil cataracts w IOL  . Excision morton's neuroma    . Wrist surgery    . Knee arthroscopy with lateral menisectomy  07/08/2012    Procedure: KNEE ARTHROSCOPY WITH LATERAL MENISECTOMY;  Surgeon: Drucilla Schmidt,  MD;  Location: [facility];  Service: Orthopedics;  Laterality: Right;   Partial    . Knee arthroscopy with medial menisectomy  07/08/2012    Procedure: KNEE ARTHROSCOPY WITH MEDIAL MENISECTOMY;  Surgeon: Drucilla Schmidt, MD;  Location:  SURGERY CENTER;  Service: Orthopedics;  Laterality: Right;  partial    No family history on file.  History   Social History  . Marital Status: Married    Spouse Name: N/A    Number of Children: N/A  . Years of Education: N/A   Occupational History  . Not on file.   Social History Main Topics  . Smoking status: Never Smoker   . Smokeless tobacco: Not on file  . Alcohol Use: Yes     Comment: rare  . Drug Use:   . Sexual Activity:    Other Topics Concern  . Not on file   Social History Narrative  . No narrative on file    Review of systems: Abnormal nuclear stress test  PHYSICAL EXAM BP 128/76  Pulse 72  Resp 16  Ht 5\' 6"  (1.676 m)  Wt 133  lb 14.4 oz (60.737 kg)  BMI 21.62 kg/m2  General: Alert, oriented x3, no distress Head: no evidence of trauma, PERRL, EOMI, no exophtalmos or lid lag, no myxedema, no xanthelasma; normal ears, nose and oropharynx Neck: normal jugular venous pulsations and no hepatojugular reflux; brisk carotid pulses without delay and no carotid bruits Chest: clear to auscultation, no signs of consolidation by percussion or palpation, normal fremitus, symmetrical and full respiratory excursions Cardiovascular: normal position and quality of the apical impulse, regular rhythm, normal first and second heart sounds, no murmurs, rubs or gallops Abdomen: no tenderness or distention, no masses by palpation, no abnormal pulsatility or arterial bruits, normal bowel sounds, no hepatosplenomegaly Extremities: no clubbing, cyanosis or edema; 2+ radial, ulnar and brachial pulses bilaterally; 2+ right femoral, posterior tibial and dorsalis pedis pulses; 2+ left femoral, posterior tibial and dorsalis  pedis pulses; no subclavian or femoral bruits Neurological: grossly nonfocal   EKG: NSR, QS V1-V2  Lipid Panel  August 2013 total cholesterol 210, triglycerides 84, HDL 72, LDL 122  BMET    Component Value Date/Time   NA 134* 04/04/2013 1458   K 4.5 04/04/2013 1458   CL 98 04/04/2013 1458   CO2 31 04/04/2013 1458   GLUCOSE 89 04/04/2013 1458   BUN 16 04/04/2013 1458   CREATININE 0.83 04/04/2013 1458   CALCIUM 9.8 04/04/2013 1458     ASSESSMENT AND PLAN Chest pain with abnormal stress test She had the recurrence of her anginal symptoms during the treadmill exercise stress test and had very severe ST segment depression. By contrast the nuclear perfusion abnormality was a very small reversible defect, involving the anterior wall. Note that she had a abnormal ECG response on a previous treadmill stress test in January 2013, but had normal perfusion images at that time. I am most impressed by the clinical reproduction of her symptoms with exercise on the treadmill and in concerned that she may have true CAD, recently becoming symptomatic and therefore consistent with unstable angina. I've recommended that she undergo coronary angiography and possible revascularization. We discussed the risks and benefits of this procedure she understands and agrees to proceed. She will have to make arrangements for somebody to spend a day or 2 supervising her husband.  Paroxysmal atrial fibrillation Currently asymptomatic. Since she has not had previous documentation of vascular disease she is only on aspirin for prophylaxis. If we do find significant coronary atherosclerosis this would move her chadsvasc score up to a range where she would need full anticoagulation.  Hyperlipidemia Similarly, if true CAD is identified we will recommend statin therapy for mildly elevated LDL cholesterol.   Orders Placed This Encounter  Procedures  . CBC  . Comp Met (CMET)  . INR/PT  . PTT  . LEFT HEART  CATHETERIZATION WITH CORONARY ANGIOGRAM   No orders of the defined types were placed in this encounter.    Junious Silk, MD, [facility] [phone] office [phone] pager

## 2013-04-08 NOTE — Progress Notes (Signed)
Cath Lab Visit (complete for each Cath Lab visit)  Clinical Evaluation Leading to the Procedure:   ACS: no  Non-ACS:    Anginal Classification: CCS II  Anti-ischemic medical therapy: Minimal Therapy (1 class of medications)  Non-Invasive Test Results: Intermediate-risk stress test findings: cardiac mortality 1-3%/year  Prior CABG: No previous CABG      Thurmon Fair, MD, [facility] [phone] office [phone] pager

## 2013-04-08 NOTE — Op Note (Signed)
CARDIAC CATHETERIZATION REPORT   Procedures performed:  1. Left heart catheterization  2. Selective coronary angiography  3. Left ventriculography   Reason for procedure:   Abnormal nuclear stress test  Procedure performed by: Thurmon Fair, MD, [facility]  Complications: none   Estimated blood loss: less than 5 mL   History:  Mallory Grant is almost 79 years old and has a history of "lone" paroxysmal atrial fibrillation that responded well from a symptomatic standpoint to treatment with a low dose of beta blocker. She now presents with exertional chest tightness that made her slow down. The chest discomfort resolved with rest. She had an abnormal ECG response to exercise and a reversible nuclear perfusion abnormality.   Consent: The risks, benefits, and details of the procedure were explained to the patient. Risks including death, MI, stroke, bleeding, limb ischemia, renal failure and allergy were described and accepted by the patient. Informed written consent was obtained prior to proceeding.  Technique: The patient was brought to the cardiac catheterization laboratory in the fasting state. He was prepped and draped in the usual sterile fashion. Local anesthesia with 1% lidocaine was administered to the right wrist area. Using the modified Seldinger technique a 5 French right radial artery sheath was introduced without difficulty. Under fluoroscopic guidance, using 5 [place] TIG catheter, selective cannulation of the left coronary artery, right coronary artery and left ventricle were respectively performed. Several coronary angiograms in a variety of projections were recorded, as well as a left ventriculogram in the RAO projection. Left ventricular pressure and a pull back to the aorta were recorded. No immediate complications occurred. At the end of the procedure, all catheters were removed. After the procedure, hemostasis will be achieved with manual pressure.  Contrast used: 50 mL  Omnipaque  Angiographic Findings:  1. The left main coronary artery is free of significant atherosclerosis and bifurcates in the usual fashion into the left anterior descending artery and left circumflex coronary artery.  2. The left anterior descending artery is a large vessel that reaches the apex and generates one major diagonal branch. There is evidence of no luminal irregularities and no calcification. No hemodynamically meaningful stenoses are seen. 3. The left circumflex coronary artery is a medium-size vessel non dominant vessel that generates one major oblique marginal artery. There is evidence of no luminal irregularities and no calcification. No hemodynamically meaningful stenoses are seen. 4. The right coronary artery is a large-size dominant vessel that generates a medium size posterior lateral ventricular system as well as the PDA. There is evidence of no luminal irregularities and no calcification. No hemodynamically meaningful stenoses are seen.  5. The left ventricle is normal in size. The left ventricle systolic function is normal with an estimated ejection fraction of 55%. Regional wall motion abnormalities are not seen. No left ventricular thrombus is seen. The ascending aorta appears normal. There is no aortic valve stenosis by pullback. The left ventricular end-diastolic pressure is 8 mm Hg.    IMPRESSIONS:  Normal coronary arteries  RECOMMENDATION:  No evidence for coronary cause of exertional symptoms. Reevaluate for pulmonary artery HTN if symptoms recur.    Thurmon Fair, MD, [facility] [phone] office [phone] pager  \

## 2013-04-22 ENCOUNTER — Encounter: Payer: Self-pay | Admitting: Cardiovascular Disease

## 2013-04-22 ENCOUNTER — Ambulatory Visit (INDEPENDENT_AMBULATORY_CARE_PROVIDER_SITE_OTHER): Payer: Medicare Other | Admitting: Cardiovascular Disease

## 2013-04-22 VITALS — BP 120/70 | HR 68 | Resp 16 | Ht 66.0 in | Wt 132.6 lb

## 2013-04-22 DIAGNOSIS — I2789 Other specified pulmonary heart diseases: Secondary | ICD-10-CM

## 2013-04-22 DIAGNOSIS — I480 Paroxysmal atrial fibrillation: Secondary | ICD-10-CM

## 2013-04-22 DIAGNOSIS — I4891 Unspecified atrial fibrillation: Secondary | ICD-10-CM

## 2013-04-22 DIAGNOSIS — I272 Pulmonary hypertension, unspecified: Secondary | ICD-10-CM

## 2013-04-22 DIAGNOSIS — R079 Chest pain, unspecified: Secondary | ICD-10-CM

## 2013-04-22 NOTE — Patient Instructions (Signed)
Your physician has requested that you have an echocardiogram. Echocardiography is a painless test that uses sound waves to create images of your heart. It provides your doctor with information about the size and shape of your heart and how well your heart's chambers and valves are working. This procedure takes approximately one hour. There are no restrictions for this procedure.  This will be scheduled in one year before your appointment.  Your physician recommends that you schedule a follow-up appointment in: One year.

## 2013-04-23 ENCOUNTER — Encounter: Payer: Self-pay | Admitting: Cardiovascular Disease

## 2013-04-23 NOTE — Progress Notes (Signed)
Patient ID: Mallory Grant, female   DOB: Nov 21, 1928, 79 y.o.   MRN: [id number]      Reason for office visit Followup after cardiac catheterization  Mallory Grant underwent coronary angiography after treadmill stress test was associated with chest pain and profound ST segment depression as well as a small apical perfusion abnormality. The coronary angiogram was completely normal. She has no problems at the radial artery access site. She has not had any more problems with chest pain. She has a history of "lone" atrial fibrillation without recent recurrence. She has not had a history of stroke, TIA or other embolic event. She takes aspirin alone for stroke prevention. A previous echocardiogram showed mild elevation in estimated systolic pulmonary artery pressure without other signs of structural changes one would expect with significant pulmonary hypertension. She is under a lot of emotional stress because she is selling her lifelong home to move to a condominium with her husband who is showing worsening signs of dementia.   Allergies  Allergen Reactions  . Benadryl [Diphenhydramine Hcl] Other (See Comments)    hyperactivity  . Codeine Nausea And Vomiting  . Novocain [Procaine Hcl] Other (See Comments)    tachycardia  . Sulfa Antibiotics Nausea And Vomiting    Current Outpatient Prescriptions  Medication Sig Dispense Refill  . aspirin 162 MG EC tablet Take 162 mg by mouth daily.      . calcium-vitamin D (OSCAL WITH D) 500-200 MG-UNIT per tablet Take 1 tablet by mouth 2 (two) times daily.       . Chlorpheniramine Maleate (ALLERGY RELIEF PO) Take 1 tablet by mouth daily.      Marland Kitchen escitalopram (LEXAPRO) 5 MG tablet Take 5 mg by mouth daily.      . fish oil-omega-3 fatty acids 1000 MG capsule Take 1 g by mouth daily.       Marland Kitchen levothyroxine (SYNTHROID, LEVOTHROID) 75 MCG tablet Take 75 mcg by mouth daily.      . magnesium oxide (MAG-OX) 400 MG tablet Take 400 mg by mouth daily.      . Multiple  Vitamins-Minerals (PRESERVISION AREDS 2) CAPS Take 1 tablet by mouth.       Marland Kitchen omeprazole (PRILOSEC) 20 MG capsule Take 20 mg by mouth daily.      . polyethylene glycol (MIRALAX / GLYCOLAX) packet Take 17 g by mouth daily as needed.        No current facility-administered medications for this visit.    Past Medical History  Diagnosis Date  . Hypothyroidism   . GERD (gastroesophageal reflux disease)   . Arthritis   . Headache(784.0)   . Macular degeneration   . History of iritis   . History of TMJ syndrome     left side  . Constipation     Past Surgical History  Procedure Laterality Date  . Tonsillectomy    . Eye surgery      bil cataracts w IOL  . Excision morton's neuroma    . Wrist surgery    . Knee arthroscopy with lateral menisectomy  07/08/2012    Procedure: KNEE ARTHROSCOPY WITH LATERAL MENISECTOMY;  Surgeon: Drucilla Schmidt, MD;  Location: [facility];  Service: Orthopedics;  Laterality: Right;   Partial    . Knee arthroscopy with medial menisectomy  07/08/2012    Procedure: KNEE ARTHROSCOPY WITH MEDIAL MENISECTOMY;  Surgeon: Drucilla Schmidt, MD;  Location: [facility];  Service: Orthopedics;  Laterality: Right;  partial    No  family history on file.  History   Social History  . Marital Status: Married    Spouse Name: N/A    Number of Children: N/A  . Years of Education: N/A   Occupational History  . Not on file.   Social History Main Topics  . Smoking status: Never Smoker   . Smokeless tobacco: Never Used  . Alcohol Use: Yes     Comment: rare  . Drug Use: No  . Sexual Activity: Not on file   Other Topics Concern  . Not on file   Social History Narrative  . No narrative on file    Review of systems: The patient specifically denies any chest pain at rest or with exertion, dyspnea at rest or with exertion, orthopnea, paroxysmal nocturnal dyspnea, syncope, palpitations, focal neurological deficits, intermittent  claudication, lower extremity edema, unexplained weight gain, cough, hemoptysis or wheezing.  The patient also denies abdominal pain, nausea, vomiting, dysphagia, diarrhea, constipation, polyuria, polydipsia, dysuria, hematuria, frequency, urgency, abnormal bleeding or bruising, fever, chills, unexpected weight changes, mood swings, change in skin or hair texture, change in voice quality, auditory or visual problems, allergic reactions or rashes, new musculoskeletal complaints other than usual "aches and pains".   PHYSICAL EXAM BP 120/70  Pulse 68  Resp 16  Ht 5\' 6"  (1.676 m)  Wt 132 lb 9.6 oz (60.147 kg)  BMI 21.41 kg/m2  General: Alert, oriented x3, no distress Head: no evidence of trauma, PERRL, EOMI, no exophtalmos or lid lag, no myxedema, no xanthelasma; normal ears, nose and oropharynx Neck: normal jugular venous pulsations and no hepatojugular reflux; brisk carotid pulses without delay and no carotid bruits Chest: clear to auscultation, no signs of consolidation by percussion or palpation, normal fremitus, symmetrical and full respiratory excursions Cardiovascular: normal position and quality of the apical impulse, regular rhythm, normal first and second heart sounds, no murmurs, rubs or gallops Abdomen: no tenderness or distention, no masses by palpation, no abnormal pulsatility or arterial bruits, normal bowel sounds, no hepatosplenomegaly Extremities: no clubbing, cyanosis or edema; 2+ radial, ulnar and brachial pulses bilaterally; 2+ right femoral, posterior tibial and dorsalis pedis pulses; 2+ left femoral, posterior tibial and dorsalis pedis pulses; no subclavian or femoral bruits Neurological: grossly nonfocal   EKG: NSR, QS V1-V2  Lipid Panel  August 2013 total cholesterol 210, triglycerides 84, HDL 72, LDL 122   BMET    Component Value Date/Time   NA 134* 04/04/2013 1458   K 4.5 04/04/2013 1458   CL 98 04/04/2013 1458   CO2 31 04/04/2013 1458   GLUCOSE 89 04/04/2013  1458   BUN 16 04/04/2013 1458   CREATININE 0.83 04/04/2013 1458   CALCIUM 9.8 04/04/2013 1458     ASSESSMENT AND PLAN Chest pain with abnormal stress test "False positive" stress test with normal coronary arteries by angiography. Consider possible pulmonary artery hypertension, with exercise related exacerbation. Note mildly elevated PA pressure of 34 mm Hg by previous echo. She is currently asymptomatic, does not have shortness of breath, does not have physical stigmata of severe pulmonary hypertension. Will defer evaluation for the future, if her symptoms return. She does not have overt findings to suggest chronic thromboembolic pulmonary disease, obstructive sleep apnea, COPD or heart failure. It is also possible that her chest pain symptoms were related to the emotional upheaval related to her husband's worsening dementia, selling their home and moving into a condominium.  Paroxysmal atrial fibrillation No recent episodes. She has "lone" atrial fibrillation without any evidence of  vascular disease and is on aspirin for stroke prophylaxis.   Will repeat an echocardiogram to assess for changes in pulmonary artery hypertension.  Orders Placed This Encounter  Procedures  . 2D Echocardiogram without contrast   No orders of the defined types were placed in this encounter.    Junious Silk, MD, [facility] [phone] office [phone] pager

## 2013-04-23 NOTE — Assessment & Plan Note (Signed)
"  False positive" stress test with normal coronary arteries by angiography. Consider possible pulmonary artery hypertension, with exercise related exacerbation. Note mildly elevated PA pressure of 34 mm Hg by previous echo. She is currently asymptomatic, does not have shortness of breath, does not have physical stigmata of severe pulmonary hypertension. Will defer evaluation for the future, if her symptoms return. She does not have overt findings to suggest chronic thromboembolic pulmonary disease, obstructive sleep apnea, COPD or heart failure. It is also possible that her chest pain symptoms were related to the emotional upheaval related to her husband's worsening dementia, selling their home and moving into a condominium.

## 2013-04-23 NOTE — Assessment & Plan Note (Signed)
No recent episodes. She has "lone" atrial fibrillation without any evidence of vascular disease and is on aspirin for stroke prophylaxis.

## 2013-05-02 ENCOUNTER — Ambulatory Visit (INDEPENDENT_AMBULATORY_CARE_PROVIDER_SITE_OTHER): Payer: Medicare Other | Admitting: Ophthalmology

## 2013-05-02 DIAGNOSIS — H35379 Puckering of macula, unspecified eye: Secondary | ICD-10-CM

## 2013-05-02 DIAGNOSIS — H43819 Vitreous degeneration, unspecified eye: Secondary | ICD-10-CM

## 2013-05-02 DIAGNOSIS — H3530 Unspecified macular degeneration: Secondary | ICD-10-CM

## 2014-03-27 ENCOUNTER — Encounter (HOSPITAL_COMMUNITY): Payer: Self-pay | Admitting: *Deleted

## 2014-04-19 ENCOUNTER — Ambulatory Visit (HOSPITAL_COMMUNITY)
Admission: RE | Admit: 2014-04-19 | Discharge: 2014-04-19 | Disposition: A | Discharge status: Home / Self Care | Payer: Medicare Other | Source: Ambulatory Visit | Attending: Cardiovascular Disease | Admitting: Cardiovascular Disease

## 2014-04-19 DIAGNOSIS — I4891 Unspecified atrial fibrillation: Secondary | ICD-10-CM | POA: Insufficient documentation

## 2014-04-19 DIAGNOSIS — I270 Primary pulmonary hypertension: Secondary | ICD-10-CM | POA: Diagnosis not present

## 2014-04-19 DIAGNOSIS — I272 Pulmonary hypertension, unspecified: Secondary | ICD-10-CM

## 2014-04-19 DIAGNOSIS — I369 Nonrheumatic tricuspid valve disorder, unspecified: Secondary | ICD-10-CM

## 2014-04-19 DIAGNOSIS — I480 Paroxysmal atrial fibrillation: Secondary | ICD-10-CM

## 2014-04-19 DIAGNOSIS — R079 Chest pain, unspecified: Secondary | ICD-10-CM

## 2014-04-19 NOTE — Progress Notes (Signed)
2D Echocardiogram Complete.  04/19/2014   Tyre Beaver [place], [place]

## 2014-04-26 ENCOUNTER — Ambulatory Visit (INDEPENDENT_AMBULATORY_CARE_PROVIDER_SITE_OTHER): Payer: Medicare Other | Admitting: Cardiovascular Disease

## 2014-04-26 ENCOUNTER — Encounter: Payer: Self-pay | Admitting: Cardiovascular Disease

## 2014-04-26 VITALS — BP 126/70 | HR 83 | Resp 16 | Ht 65.5 in | Wt 135.9 lb

## 2014-04-26 DIAGNOSIS — E785 Hyperlipidemia, unspecified: Secondary | ICD-10-CM

## 2014-04-26 DIAGNOSIS — I480 Paroxysmal atrial fibrillation: Secondary | ICD-10-CM

## 2014-04-26 NOTE — Patient Instructions (Signed)
Your physician wants you to follow-up in: 1 year with Dr. Croitoru. You will receive a reminder letter in the mail two months in advance. If you don't receive a letter, please call our office to schedule the follow-up appointment.  

## 2014-04-26 NOTE — Progress Notes (Signed)
Patient ID: Mallory Grant, female   DOB: [birth year], 80 y.o.   MRN: [id number]      Reason for office visit Paroxysmal "lone" atrial fibrillation  Mallory Grant has had only rare and brief palpitations. She has a history of "lone" atrial fibrillation without recent recurrence. She has not had a history of stroke, TIA or other embolic event. She takes aspirin alone for stroke prevention.  A previous echocardiogram showed mild elevation in estimated systolic pulmonary artery pressure without other signs of structural changes one would expect with significant pulmonary hypertension. A repeat study showed normal PA pressure a week ago. She has normal LVEF. She had normal coronaries on an angiogram performed for a "false positive" nuclear stress test. She is still under a lot of emotional stress after moving to a condominium with her husband who is showing worsening signs of dementia. She exercises regularly and notes her heart rate increases to 140 bpm with exercise.   Allergies  Allergen Reactions  . Benadryl [Diphenhydramine Hcl] Other (See Comments)    hyperactivity  . Codeine Nausea And Vomiting  . Novocain [Procaine Hcl] Other (See Comments)    tachycardia  . Sulfa Antibiotics Nausea And Vomiting    Current Outpatient Prescriptions  Medication Sig Dispense Refill  . amitriptyline (ELAVIL) 25 MG tablet Take 1 tablet by mouth daily.    Marland Kitchen aspirin 162 MG EC tablet Take 162 mg by mouth daily.    . calcium-vitamin D (OSCAL WITH D) 500-200 MG-UNIT per tablet Take 1 tablet by mouth 2 (two) times daily.     . fish oil-omega-3 fatty acids 1000 MG capsule Take 1 g by mouth daily.     Marland Kitchen levothyroxine (SYNTHROID, LEVOTHROID) 75 MCG tablet Take 75 mcg by mouth daily.    . Multiple Vitamins-Minerals (PRESERVISION AREDS 2) CAPS Take 1 tablet by mouth.     Marland Kitchen omeprazole (PRILOSEC) 20 MG capsule Take 20 mg by mouth daily.    . polyethylene glycol (MIRALAX / GLYCOLAX) packet Take 17 g by mouth daily as  needed.      No current facility-administered medications for this visit.    Past Medical History  Diagnosis Date  . Hypothyroidism   . GERD (gastroesophageal reflux disease)   . Arthritis     Wrists and knees  . Headache(784.0)   . Macular degeneration   . History of iritis   . History of TMJ syndrome     left side  . Constipation   . Atrial fibrillation     "lone", paroxysmal manner  . Baker's cyst     right calf  . Macular degeneration   . Atrial tachycardia     Past Surgical History  Procedure Laterality Date  . Tonsillectomy    . Eye surgery      bil cataracts w IOL  . Excision morton's neuroma    . Wrist surgery    . Knee arthroscopy with lateral menisectomy  07/08/2012    Procedure: KNEE ARTHROSCOPY WITH LATERAL MENISECTOMY;  Surgeon: Magnus Sinning, MD;  Location: [place];  Service: Orthopedics;  Laterality: Right;   Partial    . Knee arthroscopy with medial menisectomy  07/08/2012    Procedure: KNEE ARTHROSCOPY WITH MEDIAL MENISECTOMY;  Surgeon: Magnus Sinning, MD;  Location: [place];  Service: Orthopedics;  Laterality: Right;  partial  . Doppler echocardiography    . Nuclear scintography      No family history on file.  History  Social History  . Marital Status: Married    Spouse Name: N/A    Number of Children: N/A  . Years of Education: N/A   Occupational History  . Not on file.   Social History Main Topics  . Smoking status: Never Smoker   . Smokeless tobacco: Never Used  . Alcohol Use: Yes     Comment: rare  . Drug Use: No  . Sexual Activity: Not on file   Other Topics Concern  . Not on file   Social History Narrative    Review of systems: Rare palpitations. The patient specifically denies any chest pain at rest or with exertion, dyspnea at rest or with exertion, orthopnea, paroxysmal nocturnal dyspnea, syncope, focal neurological deficits, intermittent claudication, lower extremity edema,  unexplained weight gain, cough, hemoptysis or wheezing.  The patient also denies abdominal pain, nausea, vomiting, dysphagia, diarrhea, constipation, polyuria, polydipsia, dysuria, hematuria, frequency, urgency, abnormal bleeding or bruising, fever, chills, unexpected weight changes, mood swings, change in skin or hair texture, change in voice quality, auditory or visual problems, allergic reactions or rashes, new musculoskeletal complaints other than usual "aches and pains".   PHYSICAL EXAM BP 126/70 mmHg  Pulse 83  Resp 16  Ht 5' 5.5" (1.664 m)  Wt 135 lb 14.4 oz (61.644 kg)  BMI 22.26 kg/m2 General: Alert, oriented x3, no distress Head: no evidence of trauma, PERRL, EOMI, no exophtalmos or lid lag, no myxedema, no xanthelasma; normal ears, nose and oropharynx Neck: normal jugular venous pulsations and no hepatojugular reflux; brisk carotid pulses without delay and no carotid bruits Chest: clear to auscultation, no signs of consolidation by percussion or palpation, normal fremitus, symmetrical and full respiratory excursions Cardiovascular: normal position and quality of the apical impulse, regular rhythm, normal first and second heart sounds, no murmurs, rubs or gallops Abdomen: no tenderness or distention, no masses by palpation, no abnormal pulsatility or arterial bruits, normal bowel sounds, no hepatosplenomegaly Extremities: no clubbing, cyanosis or edema; 2+ radial, ulnar and brachial pulses bilaterally; 2+ right femoral, posterior tibial and dorsalis pedis pulses; 2+ left femoral, posterior tibial and dorsalis pedis pulses; no subclavian or femoral bruits Neurological: grossly nonfocal   EKG: NSR, QS V1-V2   BMET    Component Value Date/Time   NA 134* 04/04/2013 1458   K 4.5 04/04/2013 1458   CL 98 04/04/2013 1458   CO2 31 04/04/2013 1458   GLUCOSE 89 04/04/2013 1458   BUN 16 04/04/2013 1458   CREATININE 0.83 04/04/2013 1458   CALCIUM 9.8 04/04/2013 1458     ASSESSMENT  AND PLAN  Mallory Grant has rare episodes of "lone" AFib, none occurring recently. She has reassuringly normal findings on her recent echo and cardiac cath. Continue Aspirin, yearly follow up.  Orders Placed This Encounter  Procedures  . EKG 12-Lead   Meds ordered this encounter  Medications  . amitriptyline (ELAVIL) 25 MG tablet    Sig: Take 1 tablet by mouth daily.    Holli Humbles, MD, [place] [phone] office [phone] pager

## 2014-05-03 ENCOUNTER — Ambulatory Visit (INDEPENDENT_AMBULATORY_CARE_PROVIDER_SITE_OTHER): Payer: Medicare Other | Admitting: Ophthalmology

## 2014-05-03 DIAGNOSIS — H35372 Puckering of macula, left eye: Secondary | ICD-10-CM

## 2014-05-03 DIAGNOSIS — H3531 Nonexudative age-related macular degeneration: Secondary | ICD-10-CM

## 2014-05-03 DIAGNOSIS — H43813 Vitreous degeneration, bilateral: Secondary | ICD-10-CM

## 2014-06-01 ENCOUNTER — Encounter (HOSPITAL_COMMUNITY): Payer: Self-pay | Admitting: Cardiovascular Disease

## 2014-10-06 ENCOUNTER — Encounter (INDEPENDENT_AMBULATORY_CARE_PROVIDER_SITE_OTHER): Payer: Medicare Other | Admitting: Ophthalmology

## 2014-10-06 DIAGNOSIS — H35372 Puckering of macula, left eye: Secondary | ICD-10-CM | POA: Diagnosis not present

## 2014-10-06 DIAGNOSIS — H43813 Vitreous degeneration, bilateral: Secondary | ICD-10-CM

## 2014-10-06 DIAGNOSIS — H3531 Nonexudative age-related macular degeneration: Secondary | ICD-10-CM | POA: Diagnosis not present

## 2014-12-06 ENCOUNTER — Encounter (INDEPENDENT_AMBULATORY_CARE_PROVIDER_SITE_OTHER): Payer: Medicare Other | Admitting: Ophthalmology

## 2014-12-06 DIAGNOSIS — H59032 Cystoid macular edema following cataract surgery, left eye: Secondary | ICD-10-CM | POA: Diagnosis not present

## 2014-12-06 DIAGNOSIS — H3531 Nonexudative age-related macular degeneration: Secondary | ICD-10-CM | POA: Diagnosis not present

## 2014-12-06 DIAGNOSIS — H35372 Puckering of macula, left eye: Secondary | ICD-10-CM | POA: Diagnosis not present

## 2014-12-06 DIAGNOSIS — H43813 Vitreous degeneration, bilateral: Secondary | ICD-10-CM | POA: Diagnosis not present

## 2015-04-27 ENCOUNTER — Encounter: Payer: Self-pay | Admitting: Cardiovascular Disease

## 2015-04-30 ENCOUNTER — Ambulatory Visit (INDEPENDENT_AMBULATORY_CARE_PROVIDER_SITE_OTHER): Payer: Medicare Other | Admitting: Cardiovascular Disease

## 2015-04-30 ENCOUNTER — Encounter: Payer: Self-pay | Admitting: Cardiovascular Disease

## 2015-04-30 VITALS — BP 124/70 | HR 73 | Resp 16 | Ht 66.0 in | Wt 140.0 lb

## 2015-04-30 DIAGNOSIS — E785 Hyperlipidemia, unspecified: Secondary | ICD-10-CM

## 2015-04-30 DIAGNOSIS — I480 Paroxysmal atrial fibrillation: Secondary | ICD-10-CM

## 2015-04-30 NOTE — Patient Instructions (Signed)
Dr. Croitoru recommends that you schedule a follow-up appointment in: ONE YEAR   

## 2015-04-30 NOTE — Progress Notes (Signed)
Patient ID: Mallory Grant, female   DOB: 15-May-1929, 81 y.o.   MRN: [id number]     Cardiology Office Note   Date:  04/30/2015   ID:  Mallory Grant, DOB 1929/05/02, MRN [id number]  PCP:  Shirline Frees, MD  Cardiologist:   Sanda Klein, MD   Chief Complaint  Patient presents with  . Annual Exam    Patient has no complaints.      History of Present Illness: Mallory Grant is a 81 y.o. female who presents for  Paroxysmal lone atrial fibrillation  Mallory Grant has not had any palpitations over the last year. She has a history of "lone" atrial fibrillation without recent recurrence. She has not had a history of stroke, TIA or other embolic event. She takes aspirin alone for stroke prevention.  A previous echocardiogram showed mild elevation in estimated systolic pulmonary artery pressure without other signs of structural changes one would expect with significant pulmonary hypertension. A repeat study in 2015 showed normal PA pressure. She has normal LVEF. She had normal coronaries on an angiogram performed for a "false positive" nuclear stress test. She is still under a lot of emotional stress after moving to a condominium with her husband who is showing worsening signs of dementia. She has to do all the housework. She exercises regularly on a stationary bicycle, 30 minutes a day 5 days a week.  Past Medical History  Diagnosis Date  . Hypothyroidism   . GERD (gastroesophageal reflux disease)   . Arthritis     Wrists and knees  . Headache(784.0)   . Macular degeneration   . History of iritis   . History of TMJ syndrome     left side  . Constipation   . Atrial fibrillation ([place])     "lone", paroxysmal manner  . Baker's cyst     right calf  . Macular degeneration   . Atrial tachycardia [facility])     Past Surgical History  Procedure Laterality Date  . Tonsillectomy    . Eye surgery      bil cataracts w IOL  . Excision morton's neuroma    . Wrist surgery    . Knee arthroscopy  with lateral menisectomy  07/08/2012    Procedure: KNEE ARTHROSCOPY WITH LATERAL MENISECTOMY;  Surgeon: Magnus Sinning, MD;  Location: [place];  Service: Orthopedics;  Laterality: Right;   Partial    . Knee arthroscopy with medial menisectomy  07/08/2012    Procedure: KNEE ARTHROSCOPY WITH MEDIAL MENISECTOMY;  Surgeon: Magnus Sinning, MD;  Location: [place];  Service: Orthopedics;  Laterality: Right;  partial  . Doppler echocardiography    . Nuclear scintography    . Left heart catheterization with coronary angiogram N/A 04/08/2013    Procedure: LEFT HEART CATHETERIZATION WITH CORONARY ANGIOGRAM;  Surgeon: Sanda Klein, MD;  Location: [place] [facility];  Service: Cardiovascular;  Laterality: N/A;     Current Outpatient Prescriptions  Medication Sig Dispense Refill  . amitriptyline (ELAVIL) 25 MG tablet Take 1 tablet by mouth daily.    Marland Kitchen aspirin 162 MG EC tablet Take 162 mg by mouth daily.    . calcium-vitamin D (OSCAL WITH D) 500-200 MG-UNIT per tablet Take 1 tablet by mouth 2 (two) times daily.     . fish oil-omega-3 fatty acids 1000 MG capsule Take 1 g by mouth daily.     Marland Kitchen levothyroxine (SYNTHROID, LEVOTHROID) 75 MCG tablet Take 75 mcg by mouth daily.    Marland Kitchen  Multiple Vitamins-Minerals (PRESERVISION AREDS 2) CAPS Take 1 tablet by mouth.     Marland Kitchen omeprazole (PRILOSEC) 20 MG capsule Take 20 mg by mouth daily.    . polyethylene glycol (MIRALAX / GLYCOLAX) packet Take 17 g by mouth daily as needed.      No current facility-administered medications for this visit.    Allergies:   Benadryl; Codeine; Novocain; and Sulfa antibiotics    Social History:  The patient  reports that she has never smoked. She has never used smokeless tobacco. She reports that she drinks alcohol. She reports that she does not use illicit drugs.   Family History:  The patient's family history includes Alzheimer's disease in her father; Diabetes in her mother.    ROS:  Please see  the history of present illness.    Otherwise, review of systems positive for none.   All other systems are reviewed and negative.    PHYSICAL EXAM: VS:  BP 124/70 mmHg  Pulse 73  Resp 16  Ht 5\' 6"  (1.676 m)  Wt 140 lb (63.504 kg)  BMI 22.61 kg/m2 , BMI Body mass index is 22.61 kg/(m^2).  General: Alert, oriented x3, no distress Head: no evidence of trauma, PERRL, EOMI, no exophtalmos or lid lag, no myxedema, no xanthelasma; normal ears, nose and oropharynx Neck: normal jugular venous pulsations and no hepatojugular reflux; brisk carotid pulses without delay and no carotid bruits Chest: clear to auscultation, no signs of consolidation by percussion or palpation, normal fremitus, symmetrical and full respiratory excursions Cardiovascular: normal position and quality of the apical impulse, regular rhythm, normal first and second heart sounds, no murmurs, rubs or gallops Abdomen: no tenderness or distention, no masses by palpation, no abnormal pulsatility or arterial bruits, normal bowel sounds, no hepatosplenomegaly Extremities: no clubbing, cyanosis or edema; 2+ radial, ulnar and brachial pulses bilaterally; 2+ right femoral, posterior tibial and dorsalis pedis pulses; 2+ left femoral, posterior tibial and dorsalis pedis pulses; no subclavian or femoral bruits Neurological: grossly nonfocal Psych: euthymic mood, full affect   EKG:  EKG is ordered today. The ekg ordered today demonstrates normal sinus rhythm, QTC 398 ms    Wt Readings from Last 3 Encounters:  04/30/15 140 lb (63.504 kg)  04/26/14 135 lb 14.4 oz (61.644 kg)  04/22/13 132 lb 9.6 oz (60.147 kg)     ASSESSMENT AND PLAN:  Mallory Grant has a remote history of atrial fibrillation and no evidence of structural heart disease. Her embolic risk appears to be very low, although technically her CHADSVasc score is 35 (age 86, gender). Biologically, she appears to be much younger than her chronological age. Until we have further  documentation of new episodes of atrial fibrillation, I think continued stroke prevention with aspirin remains the best option.    Current medicines are reviewed at length with the patient today.  The patient does not have concerns regarding medicines.  The following changes have been made:  no change  Labs/ tests ordered today include:   Orders Placed This Encounter  Procedures  . EKG 12-Lead     Patient Instructions  Dr. Sallyanne Kuster recommends that you schedule a follow-up appointment in: ONE YEAR        SignedSanda Klein, MD  04/30/2015 7:01 PM    Sanda Klein, MD, [facility] [phone] office [phone] pager

## 2015-06-04 ENCOUNTER — Ambulatory Visit (INDEPENDENT_AMBULATORY_CARE_PROVIDER_SITE_OTHER): Payer: Medicare Other | Admitting: Ophthalmology

## 2015-06-04 DIAGNOSIS — H35372 Puckering of macula, left eye: Secondary | ICD-10-CM | POA: Diagnosis not present

## 2015-06-04 DIAGNOSIS — H353132 Nonexudative age-related macular degeneration, bilateral, intermediate dry stage: Secondary | ICD-10-CM

## 2015-06-04 DIAGNOSIS — H59032 Cystoid macular edema following cataract surgery, left eye: Secondary | ICD-10-CM | POA: Diagnosis not present

## 2015-06-04 DIAGNOSIS — H43813 Vitreous degeneration, bilateral: Secondary | ICD-10-CM

## 2015-09-03 ENCOUNTER — Encounter (INDEPENDENT_AMBULATORY_CARE_PROVIDER_SITE_OTHER): Payer: Medicare Other | Admitting: Ophthalmology

## 2015-09-03 DIAGNOSIS — H59032 Cystoid macular edema following cataract surgery, left eye: Secondary | ICD-10-CM

## 2015-09-03 DIAGNOSIS — H353112 Nonexudative age-related macular degeneration, right eye, intermediate dry stage: Secondary | ICD-10-CM | POA: Diagnosis not present

## 2015-09-03 DIAGNOSIS — H3561 Retinal hemorrhage, right eye: Secondary | ICD-10-CM

## 2015-09-03 DIAGNOSIS — H43813 Vitreous degeneration, bilateral: Secondary | ICD-10-CM | POA: Diagnosis not present

## 2015-09-03 DIAGNOSIS — H35372 Puckering of macula, left eye: Secondary | ICD-10-CM

## 2015-12-05 ENCOUNTER — Encounter (INDEPENDENT_AMBULATORY_CARE_PROVIDER_SITE_OTHER): Payer: Medicare Other | Admitting: Ophthalmology

## 2015-12-19 ENCOUNTER — Encounter (INDEPENDENT_AMBULATORY_CARE_PROVIDER_SITE_OTHER): Payer: Medicare Other | Admitting: Ophthalmology

## 2015-12-19 DIAGNOSIS — H59032 Cystoid macular edema following cataract surgery, left eye: Secondary | ICD-10-CM

## 2015-12-19 DIAGNOSIS — H43813 Vitreous degeneration, bilateral: Secondary | ICD-10-CM | POA: Diagnosis not present

## 2015-12-19 DIAGNOSIS — H00014 Hordeolum externum left upper eyelid: Secondary | ICD-10-CM | POA: Diagnosis not present

## 2015-12-19 DIAGNOSIS — H353112 Nonexudative age-related macular degeneration, right eye, intermediate dry stage: Secondary | ICD-10-CM | POA: Diagnosis not present

## 2015-12-19 DIAGNOSIS — H35372 Puckering of macula, left eye: Secondary | ICD-10-CM | POA: Diagnosis not present

## 2015-12-19 DIAGNOSIS — H3561 Retinal hemorrhage, right eye: Secondary | ICD-10-CM | POA: Diagnosis not present

## 2016-04-23 ENCOUNTER — Ambulatory Visit (INDEPENDENT_AMBULATORY_CARE_PROVIDER_SITE_OTHER): Payer: Medicare Other | Admitting: Ophthalmology

## 2016-04-23 DIAGNOSIS — H3561 Retinal hemorrhage, right eye: Secondary | ICD-10-CM | POA: Diagnosis not present

## 2016-04-23 DIAGNOSIS — H59032 Cystoid macular edema following cataract surgery, left eye: Secondary | ICD-10-CM | POA: Diagnosis not present

## 2016-04-23 DIAGNOSIS — H3554 Dystrophies primarily involving the retinal pigment epithelium: Secondary | ICD-10-CM | POA: Diagnosis not present

## 2016-04-23 DIAGNOSIS — H43813 Vitreous degeneration, bilateral: Secondary | ICD-10-CM | POA: Diagnosis not present

## 2016-05-21 ENCOUNTER — Ambulatory Visit (INDEPENDENT_AMBULATORY_CARE_PROVIDER_SITE_OTHER): Payer: Medicare Other | Admitting: Cardiovascular Disease

## 2016-05-21 ENCOUNTER — Encounter: Payer: Self-pay | Admitting: Cardiovascular Disease

## 2016-05-21 VITALS — BP 118/66 | HR 75 | Ht 66.0 in | Wt 139.0 lb

## 2016-05-21 DIAGNOSIS — I480 Paroxysmal atrial fibrillation: Secondary | ICD-10-CM | POA: Diagnosis not present

## 2016-05-21 NOTE — Progress Notes (Signed)
Cardiology Office Note    Date:  05/21/2016   ID:  ERMER CLATTERBUCK, DOB 1928-12-18, MRN [id number]  PCP:  Shirline Frees, MD  Cardiologist:   Sanda Klein, MD   Chief Complaint  Patient presents with  . Follow-up    History of Present Illness:  Mallory Grant is a 82 y.o. female with a history of "lone" paroxysmal atrial fibrillation without recent recurrence. She has very infrequent palpitations, generally when she is upset. She has had difficulty adjusting to her husband's worsening dementia. She continues to exercise 30 minutes a day on her stationary bike but has not been walking as much as in the past. She was recently diagnosed with macular degeneration and has been told that she has retinal hemorrhage and will require "shots" in her eyes.  The patient specifically denies any chest pain at rest exertion, dyspnea at rest or with exertion, orthopnea, paroxysmal nocturnal dyspnea, syncope, palpitations, focal neurological deficits, intermittent claudication, lower extremity edema, unexplained weight gain, cough, hemoptysis or wheezing.  She had a normal echocardiogram in 2015 with a normal left ventricular ejection fraction. She has a false positive nuclear stress test followed by normal coronary angiography in 2014.  Past Medical History:  Diagnosis Date  . Arthritis    Wrists and knees  . Atrial fibrillation ([place])    "lone", paroxysmal manner  . Atrial tachycardia ([place])   . Baker's cyst    right calf  . Constipation   . GERD (gastroesophageal reflux disease)   . Headache(784.0)   . History of iritis   . History of TMJ syndrome    left side  . Hypothyroidism   . Macular degeneration   . Macular degeneration     Past Surgical History:  Procedure Laterality Date  . DOPPLER ECHOCARDIOGRAPHY    . EXCISION MORTON'S NEUROMA    . EYE SURGERY     bil cataracts w IOL  . KNEE ARTHROSCOPY WITH LATERAL MENISECTOMY  07/08/2012   Procedure: KNEE ARTHROSCOPY WITH LATERAL  MENISECTOMY;  Surgeon: Magnus Sinning, MD;  Location: [facility];  Service: Orthopedics;  Laterality: Right;   Partial    . KNEE ARTHROSCOPY WITH MEDIAL MENISECTOMY  07/08/2012   Procedure: KNEE ARTHROSCOPY WITH MEDIAL MENISECTOMY;  Surgeon: Magnus Sinning, MD;  Location: [facility];  Service: Orthopedics;  Laterality: Right;  partial  . LEFT HEART CATHETERIZATION WITH CORONARY ANGIOGRAM N/A 04/08/2013   Procedure: LEFT HEART CATHETERIZATION WITH CORONARY ANGIOGRAM;  Surgeon: Sanda Klein, MD;  Location: [facility] CATH [facility];  Service: Cardiovascular;  Laterality: N/A;  . nuclear scintography    . TONSILLECTOMY    . WRIST SURGERY      Current Medications: Outpatient Medications Prior to Visit  Medication Sig Dispense Refill  . amitriptyline (ELAVIL) 25 MG tablet Take 1 tablet by mouth daily.    Marland Kitchen aspirin 162 MG EC tablet Take 162 mg by mouth daily.    . calcium-vitamin D (OSCAL WITH D) 500-200 MG-UNIT per tablet Take 1 tablet by mouth 2 (two) times daily.     . fish oil-omega-3 fatty acids 1000 MG capsule Take 1 g by mouth daily.     Marland Kitchen levothyroxine (SYNTHROID, LEVOTHROID) 75 MCG tablet Take 75 mcg by mouth daily.    . Multiple Vitamins-Minerals (PRESERVISION AREDS 2) CAPS Take 1 tablet by mouth 2 (two) times daily.     Marland Kitchen omeprazole (PRILOSEC) 20 MG capsule Take 20 mg by mouth daily.    . polyethylene glycol (  MIRALAX / GLYCOLAX) packet Take 17 g by mouth daily as needed.      No facility-administered medications prior to visit.      Allergies:   Benadryl [diphenhydramine hcl]; Codeine; Novocain [procaine hcl]; and Sulfa antibiotics   Social History   Social History  . Marital status: Married    Spouse name: N/A  . Number of children: N/A  . Years of education: N/A   Social History Main Topics  . Smoking status: Never Smoker  . Smokeless tobacco: Never Used  . Alcohol use Yes     Comment: rare  . Drug use: No  . Sexual activity: Not Asked    Other Topics Concern  . None   Social History Narrative  . None     Family History:  The patient's family history includes Alzheimer's disease in her father; Diabetes in her mother.   ROS:   Please see the history of present illness.    ROS All other systems reviewed and are negative.   PHYSICAL EXAM:   VS:  BP 118/66 (BP Location: Right Arm, Patient Position: Sitting, Cuff Size: Normal)   Pulse 75   Ht 5\' 6"  (1.676 m)   Wt 139 lb (63 kg)   BMI 22.44 kg/m    GEN: Well nourished, well developed, in no acute distress  HEENT: normal  Neck: no JVD, carotid bruits, or masses Cardiac: RRR; no murmurs, rubs, or gallops,no edema  Respiratory:  clear to auscultation bilaterally, normal work of breathing GI: soft, nontender, nondistended, + BS MS: no deformity or atrophy  Skin: warm and dry, no rash Neuro:  Alert and Oriented x 3, Strength and sensation are intact Psych: euthymic mood, full affect  Wt Readings from Last 3 Encounters:  05/21/16 139 lb (63 kg)  04/30/15 140 lb (63.5 kg)  04/26/14 135 lb 14.4 oz (61.6 kg)      Studies/Labs Reviewed:   EKG:  EKG is ordered today.  The ekg ordered today demonstrates normal sinus rhythm, QS pattern in leads V1 and V2, QTC 404 ms   ASSESSMENT:    1. Paroxysmal atrial fibrillation (HCC)      PLAN:  In order of problems listed above:  1. AFib: Mallory Grant has a remote history of atrial fibrillation that has not recurred in years. She is not on anticoagulation since her embolic risk is not particularly high (CHADSVasc 62 -age and gender) and she now has problems with intraocular hemorrhage. Will continue aspirin for the time being. She has never had stroke or TIA.    Medication Adjustments/Labs and Tests Ordered: Current medicines are reviewed at length with the patient today.  Concerns regarding medicines are outlined above.  Medication changes, Labs and Tests ordered today are listed in the Patient Instructions  below. Patient Instructions  Dr Sallyanne Kuster recommends that you schedule a follow-up appointment in 12 months. You will receive a reminder letter in the mail two months in advance. If you don't receive a letter, please call our office to schedule the follow-up appointment.  If you need a refill on your cardiac medications before your next appointment, please call your pharmacy.    Signed, Sanda Klein, MD  05/21/2016 1:00 PM    [facility] [address], [place], [place]  [address] Phone: [phone]; Fax: [phone]

## 2016-05-21 NOTE — Patient Instructions (Signed)
Dr Croitoru recommends that you schedule a follow-up appointment in 12 months. You will receive a reminder letter in the mail two months in advance. If you don't receive a letter, please call our office to schedule the follow-up appointment.  If you need a refill on your cardiac medications before your next appointment, please call your pharmacy. 

## 2016-05-28 ENCOUNTER — Encounter (INDEPENDENT_AMBULATORY_CARE_PROVIDER_SITE_OTHER): Payer: Medicare Other | Admitting: Ophthalmology

## 2016-05-28 DIAGNOSIS — H43813 Vitreous degeneration, bilateral: Secondary | ICD-10-CM | POA: Diagnosis not present

## 2016-05-28 DIAGNOSIS — H353211 Exudative age-related macular degeneration, right eye, with active choroidal neovascularization: Secondary | ICD-10-CM | POA: Diagnosis not present

## 2016-05-28 DIAGNOSIS — H35372 Puckering of macula, left eye: Secondary | ICD-10-CM | POA: Diagnosis not present

## 2016-05-28 DIAGNOSIS — H59032 Cystoid macular edema following cataract surgery, left eye: Secondary | ICD-10-CM

## 2016-06-24 ENCOUNTER — Encounter (INDEPENDENT_AMBULATORY_CARE_PROVIDER_SITE_OTHER): Payer: Medicare Other | Admitting: Ophthalmology

## 2016-06-24 DIAGNOSIS — H59032 Cystoid macular edema following cataract surgery, left eye: Secondary | ICD-10-CM | POA: Diagnosis not present

## 2016-06-24 DIAGNOSIS — H353231 Exudative age-related macular degeneration, bilateral, with active choroidal neovascularization: Secondary | ICD-10-CM | POA: Diagnosis not present

## 2016-06-24 DIAGNOSIS — H43813 Vitreous degeneration, bilateral: Secondary | ICD-10-CM | POA: Diagnosis not present

## 2016-07-21 ENCOUNTER — Encounter (INDEPENDENT_AMBULATORY_CARE_PROVIDER_SITE_OTHER): Payer: Medicare Other | Admitting: Ophthalmology

## 2016-07-21 DIAGNOSIS — H35733 Hemorrhagic detachment of retinal pigment epithelium, bilateral: Secondary | ICD-10-CM | POA: Diagnosis not present

## 2016-07-21 DIAGNOSIS — H59032 Cystoid macular edema following cataract surgery, left eye: Secondary | ICD-10-CM

## 2016-07-21 DIAGNOSIS — H43813 Vitreous degeneration, bilateral: Secondary | ICD-10-CM | POA: Diagnosis not present

## 2016-07-21 DIAGNOSIS — H353231 Exudative age-related macular degeneration, bilateral, with active choroidal neovascularization: Secondary | ICD-10-CM | POA: Diagnosis not present

## 2016-08-18 ENCOUNTER — Encounter (INDEPENDENT_AMBULATORY_CARE_PROVIDER_SITE_OTHER): Payer: Medicare Other | Admitting: Ophthalmology

## 2016-08-18 DIAGNOSIS — H43813 Vitreous degeneration, bilateral: Secondary | ICD-10-CM

## 2016-08-18 DIAGNOSIS — H35372 Puckering of macula, left eye: Secondary | ICD-10-CM | POA: Diagnosis not present

## 2016-08-18 DIAGNOSIS — H353231 Exudative age-related macular degeneration, bilateral, with active choroidal neovascularization: Secondary | ICD-10-CM

## 2016-09-15 ENCOUNTER — Encounter (INDEPENDENT_AMBULATORY_CARE_PROVIDER_SITE_OTHER): Payer: Medicare Other | Admitting: Ophthalmology

## 2016-09-18 ENCOUNTER — Encounter (INDEPENDENT_AMBULATORY_CARE_PROVIDER_SITE_OTHER): Payer: Medicare Other | Admitting: Ophthalmology

## 2016-09-18 DIAGNOSIS — H35372 Puckering of macula, left eye: Secondary | ICD-10-CM

## 2016-09-18 DIAGNOSIS — H43813 Vitreous degeneration, bilateral: Secondary | ICD-10-CM | POA: Diagnosis not present

## 2016-09-18 DIAGNOSIS — H35733 Hemorrhagic detachment of retinal pigment epithelium, bilateral: Secondary | ICD-10-CM | POA: Diagnosis not present

## 2016-09-18 DIAGNOSIS — H353231 Exudative age-related macular degeneration, bilateral, with active choroidal neovascularization: Secondary | ICD-10-CM | POA: Diagnosis not present

## 2016-10-22 ENCOUNTER — Encounter (INDEPENDENT_AMBULATORY_CARE_PROVIDER_SITE_OTHER): Payer: Medicare Other | Admitting: Ophthalmology

## 2016-10-22 DIAGNOSIS — H43813 Vitreous degeneration, bilateral: Secondary | ICD-10-CM

## 2016-10-22 DIAGNOSIS — H35372 Puckering of macula, left eye: Secondary | ICD-10-CM | POA: Diagnosis not present

## 2016-10-22 DIAGNOSIS — H35733 Hemorrhagic detachment of retinal pigment epithelium, bilateral: Secondary | ICD-10-CM

## 2016-10-22 DIAGNOSIS — H353231 Exudative age-related macular degeneration, bilateral, with active choroidal neovascularization: Secondary | ICD-10-CM | POA: Diagnosis not present

## 2016-11-26 ENCOUNTER — Encounter (INDEPENDENT_AMBULATORY_CARE_PROVIDER_SITE_OTHER): Payer: Medicare Other | Admitting: Ophthalmology

## 2016-11-26 DIAGNOSIS — H59032 Cystoid macular edema following cataract surgery, left eye: Secondary | ICD-10-CM | POA: Diagnosis not present

## 2016-11-26 DIAGNOSIS — H35372 Puckering of macula, left eye: Secondary | ICD-10-CM | POA: Diagnosis not present

## 2016-11-26 DIAGNOSIS — I10 Essential (primary) hypertension: Secondary | ICD-10-CM

## 2016-11-26 DIAGNOSIS — H35033 Hypertensive retinopathy, bilateral: Secondary | ICD-10-CM | POA: Diagnosis not present

## 2016-11-26 DIAGNOSIS — H43813 Vitreous degeneration, bilateral: Secondary | ICD-10-CM | POA: Diagnosis not present

## 2016-11-26 DIAGNOSIS — H353231 Exudative age-related macular degeneration, bilateral, with active choroidal neovascularization: Secondary | ICD-10-CM

## 2016-12-31 ENCOUNTER — Encounter (INDEPENDENT_AMBULATORY_CARE_PROVIDER_SITE_OTHER): Payer: Medicare Other | Admitting: Ophthalmology

## 2016-12-31 DIAGNOSIS — I10 Essential (primary) hypertension: Secondary | ICD-10-CM | POA: Diagnosis not present

## 2016-12-31 DIAGNOSIS — H35372 Puckering of macula, left eye: Secondary | ICD-10-CM | POA: Diagnosis not present

## 2016-12-31 DIAGNOSIS — H353231 Exudative age-related macular degeneration, bilateral, with active choroidal neovascularization: Secondary | ICD-10-CM | POA: Diagnosis not present

## 2016-12-31 DIAGNOSIS — H43813 Vitreous degeneration, bilateral: Secondary | ICD-10-CM | POA: Diagnosis not present

## 2016-12-31 DIAGNOSIS — H35033 Hypertensive retinopathy, bilateral: Secondary | ICD-10-CM | POA: Diagnosis not present

## 2017-02-05 ENCOUNTER — Encounter (INDEPENDENT_AMBULATORY_CARE_PROVIDER_SITE_OTHER): Payer: Medicare Other | Admitting: Ophthalmology

## 2017-02-05 DIAGNOSIS — I10 Essential (primary) hypertension: Secondary | ICD-10-CM | POA: Diagnosis not present

## 2017-02-05 DIAGNOSIS — H35033 Hypertensive retinopathy, bilateral: Secondary | ICD-10-CM

## 2017-02-05 DIAGNOSIS — H43813 Vitreous degeneration, bilateral: Secondary | ICD-10-CM

## 2017-02-05 DIAGNOSIS — H35372 Puckering of macula, left eye: Secondary | ICD-10-CM

## 2017-02-05 DIAGNOSIS — H353231 Exudative age-related macular degeneration, bilateral, with active choroidal neovascularization: Secondary | ICD-10-CM | POA: Diagnosis not present

## 2017-03-21 LAB — GLUCOSE, POCT (MANUAL RESULT ENTRY): POC GLUCOSE: 106 mg/dL — AB (ref 70–99)

## 2017-03-26 ENCOUNTER — Encounter (INDEPENDENT_AMBULATORY_CARE_PROVIDER_SITE_OTHER): Payer: Medicare Other | Admitting: Ophthalmology

## 2017-03-26 DIAGNOSIS — I10 Essential (primary) hypertension: Secondary | ICD-10-CM | POA: Diagnosis not present

## 2017-03-26 DIAGNOSIS — H35372 Puckering of macula, left eye: Secondary | ICD-10-CM | POA: Diagnosis not present

## 2017-03-26 DIAGNOSIS — H35033 Hypertensive retinopathy, bilateral: Secondary | ICD-10-CM | POA: Diagnosis not present

## 2017-03-26 DIAGNOSIS — H43813 Vitreous degeneration, bilateral: Secondary | ICD-10-CM

## 2017-03-26 DIAGNOSIS — H353231 Exudative age-related macular degeneration, bilateral, with active choroidal neovascularization: Secondary | ICD-10-CM | POA: Diagnosis not present

## 2017-05-08 ENCOUNTER — Encounter (INDEPENDENT_AMBULATORY_CARE_PROVIDER_SITE_OTHER): Payer: Medicare Other | Admitting: Ophthalmology

## 2017-05-08 DIAGNOSIS — H35033 Hypertensive retinopathy, bilateral: Secondary | ICD-10-CM | POA: Diagnosis not present

## 2017-05-08 DIAGNOSIS — I10 Essential (primary) hypertension: Secondary | ICD-10-CM | POA: Diagnosis not present

## 2017-05-08 DIAGNOSIS — H43813 Vitreous degeneration, bilateral: Secondary | ICD-10-CM

## 2017-05-08 DIAGNOSIS — H353231 Exudative age-related macular degeneration, bilateral, with active choroidal neovascularization: Secondary | ICD-10-CM | POA: Diagnosis not present

## 2017-06-04 ENCOUNTER — Encounter: Payer: Self-pay | Admitting: Cardiovascular Disease

## 2017-06-04 ENCOUNTER — Ambulatory Visit: Payer: Medicare Other | Admitting: Cardiovascular Disease

## 2017-06-04 VITALS — BP 122/70 | HR 81 | Ht 66.0 in | Wt 139.0 lb

## 2017-06-04 DIAGNOSIS — I480 Paroxysmal atrial fibrillation: Secondary | ICD-10-CM | POA: Diagnosis not present

## 2017-06-04 NOTE — Progress Notes (Signed)
Cardiology Office Note    Date:  06/04/2017   ID:  Mallory Grant, DOB Apr 04, 1929, MRN [id number]  PCP:  Shirline Frees, MD  Cardiologist:   Sanda Klein, MD   Chief Complaint  Patient presents with  . Chest Pain    History of Present Illness:  Mallory Grant is a 83 y.o. female with a history of "lone" paroxysmal atrial fibrillation without recent recurrence.   She has occasional very brief episodes of chest discomfort, never associated with activity, frequently associated with anxiety.  They last for only a couple of seconds.  They do not sound like angina pectoris.  She has not had palpitations recently.  Her biggest problem continues to be the difficulty of motions associated with her husband's worsening dementia.  After a fall and leg fracture, he went to rehab and then to the [facility].  She try to bring him back home but after brief period of time it became apparent that she can no longer take care of him.  She obviously has conflicting emotions about the fact that he is now in a nursing home and cries easily.  Her husband still recognizes her, but does not recognize any of the other family members, at least not by name.  She continues to receive treatment for wet macular degeneration with intraocular injections every 7 weeks.  The patient specifically denies any chest pain with exertion, dyspnea at rest or with exertion, orthopnea, paroxysmal nocturnal dyspnea, syncope, palpitations, focal neurological deficits, intermittent claudication, lower extremity edema, unexplained weight gain, cough, hemoptysis or wheezing.  She had a normal echocardiogram in 2015 with a normal left ventricular ejection fraction. She has a false positive nuclear stress test followed by normal coronary angiography in 2014.  Past Medical History:  Diagnosis Date  . Arthritis    Wrists and knees  . Atrial fibrillation ([place])    "lone", paroxysmal manner  . Atrial tachycardia ([place])    . Baker's cyst    right calf  . Constipation   . GERD (gastroesophageal reflux disease)   . Headache(784.0)   . History of iritis   . History of TMJ syndrome    left side  . Hypothyroidism   . Macular degeneration   . Macular degeneration     Past Surgical History:  Procedure Laterality Date  . DOPPLER ECHOCARDIOGRAPHY    . EXCISION MORTON'S NEUROMA    . EYE SURGERY     bil cataracts w IOL  . KNEE ARTHROSCOPY WITH LATERAL MENISECTOMY  07/08/2012   Procedure: KNEE ARTHROSCOPY WITH LATERAL MENISECTOMY;  Surgeon: Magnus Sinning, MD;  Location: [facility];  Service: Orthopedics;  Laterality: Right;   Partial    . KNEE ARTHROSCOPY WITH MEDIAL MENISECTOMY  07/08/2012   Procedure: KNEE ARTHROSCOPY WITH MEDIAL MENISECTOMY;  Surgeon: Magnus Sinning, MD;  Location: [facility];  Service: Orthopedics;  Laterality: Right;  partial  . LEFT HEART CATHETERIZATION WITH CORONARY ANGIOGRAM N/A 04/08/2013   Procedure: LEFT HEART CATHETERIZATION WITH CORONARY ANGIOGRAM;  Surgeon: Sanda Klein, MD;  Location: [facility] CATH [facility];  Service: Cardiovascular;  Laterality: N/A;  . nuclear scintography    . TONSILLECTOMY    . WRIST SURGERY      Current Medications: Outpatient Medications Prior to Visit  Medication Sig Dispense Refill  . amitriptyline (ELAVIL) 25 MG tablet Take 1 tablet by mouth daily.    Marland Kitchen aspirin 162 MG EC tablet Take 162 mg by mouth daily.    Marland Kitchen  Cholecalciferol (VITAMIN D PO) Take 25 Units by mouth 2 (two) times daily.    . fish oil-omega-3 fatty acids 1000 MG capsule Take 1 g by mouth daily.     Marland Kitchen levothyroxine (SYNTHROID, LEVOTHROID) 75 MCG tablet Take 75 mcg by mouth daily.    . Magnesium 250 MG TABS Take 1 tablet by mouth daily.    . Multiple Vitamins-Minerals (PRESERVISION AREDS 2) CAPS Take 1 tablet by mouth 2 (two) times daily.     . polyethylene glycol (MIRALAX / GLYCOLAX) packet Take 17 g by mouth daily as needed.     . ranitidine (ZANTAC) 150  MG capsule Take 150 mg by mouth 2 (two) times daily.    . calcium-vitamin D (OSCAL WITH D) 500-200 MG-UNIT per tablet Take 1 tablet by mouth 2 (two) times daily.     Marland Kitchen omeprazole (PRILOSEC) 20 MG capsule Take 20 mg by mouth daily.     No facility-administered medications prior to visit.      Allergies:   Diphenhydramine-zinc acetate; Procaine; Benadryl [diphenhydramine hcl]; Codeine; Novocain [procaine hcl]; and Sulfa antibiotics   Social History   Socioeconomic History  . Marital status: Married    Spouse name: None  . Number of children: None  . Years of education: None  . Highest education level: None  Social Needs  . Financial resource strain: None  . Food insecurity - worry: None  . Food insecurity - inability: None  . Transportation needs - medical: None  . Transportation needs - non-medical: None  Occupational History  . None  Tobacco Use  . Smoking status: Never Smoker  . Smokeless tobacco: Never Used  Substance and Sexual Activity  . Alcohol use: Yes    Comment: rare  . Drug use: No  . Sexual activity: None  Other Topics Concern  . None  Social History Narrative  . None     Family History:  The patient's family history includes Alzheimer's disease in her father; Diabetes in her mother.   ROS:   Please see the history of present illness.    ROS All other systems reviewed and are negative.   PHYSICAL EXAM:   VS:  BP 122/70   Pulse 81   Ht 5\' 6"  (1.676 m)   Wt 139 lb (63 kg)   BMI 22.44 kg/m     General: Alert, oriented x3, no distress, very slender Head: no evidence of trauma, PERRL, EOMI, no exophtalmos or lid lag, no myxedema, no xanthelasma; normal ears, nose and oropharynx Neck: normal jugular venous pulsations and no hepatojugular reflux; brisk carotid pulses without delay and no carotid bruits Chest: clear to auscultation, no signs of consolidation by percussion or palpation, normal fremitus, symmetrical and full respiratory  excursions Cardiovascular: normal position and quality of the apical impulse, regular rhythm, normal first and second heart sounds, no murmurs, rubs or gallops Abdomen: no tenderness or distention, no masses by palpation, no abnormal pulsatility or arterial bruits, normal bowel sounds, no hepatosplenomegaly Extremities: no clubbing, cyanosis or edema; 2+ radial, ulnar and brachial pulses bilaterally; 2+ right femoral, posterior tibial and dorsalis pedis pulses; 2+ left femoral, posterior tibial and dorsalis pedis pulses; no subclavian or femoral bruits Neurological: grossly nonfocal Psych: Normal mood and affect   Wt Readings from Last 3 Encounters:  06/04/17 139 lb (63 kg)  05/21/16 139 lb (63 kg)  04/30/15 140 lb (63.5 kg)      Studies/Labs Reviewed:   EKG:  EKG is ordered today.  Shows normal  sinus rhythm, normal tracing, QTC 406 ms  ASSESSMENT:    1. Paroxysmal atrial fibrillation (HCC)      PLAN:  In order of problems listed above:  AFib: Mrs. Garoutte has a remote history of atrial fibrillation that has not recurred clinically in years.  She has modest to moderately increased embolic risk (CHADSVasc 59 -age and gender), she has never had stroke/TIA or other embolic events.  She is at increased risk of worsening vision due to intraocular hemorrhage.  We have discussed the pros and cons of anticoagulation and decided to continue aspirin only, rather than full anticoagulant therapy.  She is asked to promptly report any focal neurological event.   Medication Adjustments/Labs and Tests Ordered: Current medicines are reviewed at length with the patient today.  Concerns regarding medicines are outlined above.  Medication changes, Labs and Tests ordered today are listed in the Patient Instructions below. Patient Instructions  Dr Sallyanne Kuster recommends that you schedule a follow-up appointment in 12 months. You will receive a reminder letter in the mail two months in advance. If you don't  receive a letter, please call our office to schedule the follow-up appointment.  If you need a refill on your cardiac medications before your next appointment, please call your pharmacy.    Signed, Sanda Klein, MD  06/04/2017 12:09 PM    [facility], [place], [place]  [address] Phone: [phone]; Fax: [phone]

## 2017-06-04 NOTE — Patient Instructions (Signed)
Dr Croitoru recommends that you schedule a follow-up appointment in 12 months. You will receive a reminder letter in the mail two months in advance. If you don't receive a letter, please call our office to schedule the follow-up appointment.  If you need a refill on your cardiac medications before your next appointment, please call your pharmacy. 

## 2017-06-26 ENCOUNTER — Encounter (INDEPENDENT_AMBULATORY_CARE_PROVIDER_SITE_OTHER): Payer: Medicare Other | Admitting: Ophthalmology

## 2017-07-01 ENCOUNTER — Encounter (INDEPENDENT_AMBULATORY_CARE_PROVIDER_SITE_OTHER): Payer: Medicare Other | Admitting: Ophthalmology

## 2017-07-01 DIAGNOSIS — H35033 Hypertensive retinopathy, bilateral: Secondary | ICD-10-CM | POA: Diagnosis not present

## 2017-07-01 DIAGNOSIS — H43813 Vitreous degeneration, bilateral: Secondary | ICD-10-CM

## 2017-07-01 DIAGNOSIS — H353231 Exudative age-related macular degeneration, bilateral, with active choroidal neovascularization: Secondary | ICD-10-CM | POA: Diagnosis not present

## 2017-07-01 DIAGNOSIS — I10 Essential (primary) hypertension: Secondary | ICD-10-CM | POA: Diagnosis not present

## 2017-08-19 ENCOUNTER — Encounter (INDEPENDENT_AMBULATORY_CARE_PROVIDER_SITE_OTHER): Payer: Medicare Other | Admitting: Ophthalmology

## 2017-08-19 DIAGNOSIS — I10 Essential (primary) hypertension: Secondary | ICD-10-CM | POA: Diagnosis not present

## 2017-08-19 DIAGNOSIS — H43813 Vitreous degeneration, bilateral: Secondary | ICD-10-CM | POA: Diagnosis not present

## 2017-08-19 DIAGNOSIS — H35033 Hypertensive retinopathy, bilateral: Secondary | ICD-10-CM | POA: Diagnosis not present

## 2017-08-19 DIAGNOSIS — H353231 Exudative age-related macular degeneration, bilateral, with active choroidal neovascularization: Secondary | ICD-10-CM

## 2017-10-05 ENCOUNTER — Encounter (INDEPENDENT_AMBULATORY_CARE_PROVIDER_SITE_OTHER): Payer: Medicare Other | Admitting: Ophthalmology

## 2017-10-05 DIAGNOSIS — H35372 Puckering of macula, left eye: Secondary | ICD-10-CM | POA: Diagnosis not present

## 2017-10-05 DIAGNOSIS — I10 Essential (primary) hypertension: Secondary | ICD-10-CM

## 2017-10-05 DIAGNOSIS — H43813 Vitreous degeneration, bilateral: Secondary | ICD-10-CM

## 2017-10-05 DIAGNOSIS — H35033 Hypertensive retinopathy, bilateral: Secondary | ICD-10-CM

## 2017-10-05 DIAGNOSIS — H353231 Exudative age-related macular degeneration, bilateral, with active choroidal neovascularization: Secondary | ICD-10-CM | POA: Diagnosis not present

## 2017-11-23 ENCOUNTER — Encounter (INDEPENDENT_AMBULATORY_CARE_PROVIDER_SITE_OTHER): Payer: Medicare Other | Admitting: Ophthalmology

## 2017-11-23 DIAGNOSIS — I10 Essential (primary) hypertension: Secondary | ICD-10-CM | POA: Diagnosis not present

## 2017-11-23 DIAGNOSIS — H35033 Hypertensive retinopathy, bilateral: Secondary | ICD-10-CM | POA: Diagnosis not present

## 2017-11-23 DIAGNOSIS — H43813 Vitreous degeneration, bilateral: Secondary | ICD-10-CM | POA: Diagnosis not present

## 2017-11-23 DIAGNOSIS — H353231 Exudative age-related macular degeneration, bilateral, with active choroidal neovascularization: Secondary | ICD-10-CM | POA: Diagnosis not present

## 2017-12-09 ENCOUNTER — Encounter: Payer: Self-pay | Admitting: *Deleted

## 2017-12-09 NOTE — Congregational Nurse Program (Signed)
[id number] Mallory Grant,BSN,RN3,CCM,CN/Spoke with client about using the grieving services through hos;ice and palliative care of [facility].  States that she is aware if the services available and will take advantage of them if needed.  Client becomes very tearful when talking to other emeber of her congregation.  Did offer to be available to talk anytime she needs.  Have kept open dialogue with client and have sent her several encouragement cards.

## 2018-01-11 ENCOUNTER — Encounter (INDEPENDENT_AMBULATORY_CARE_PROVIDER_SITE_OTHER): Payer: Medicare Other | Admitting: Ophthalmology

## 2018-01-11 DIAGNOSIS — I10 Essential (primary) hypertension: Secondary | ICD-10-CM

## 2018-01-11 DIAGNOSIS — D3131 Benign neoplasm of right choroid: Secondary | ICD-10-CM

## 2018-01-11 DIAGNOSIS — H35033 Hypertensive retinopathy, bilateral: Secondary | ICD-10-CM | POA: Diagnosis not present

## 2018-01-11 DIAGNOSIS — H353231 Exudative age-related macular degeneration, bilateral, with active choroidal neovascularization: Secondary | ICD-10-CM

## 2018-01-11 DIAGNOSIS — H43813 Vitreous degeneration, bilateral: Secondary | ICD-10-CM

## 2018-03-15 ENCOUNTER — Encounter (INDEPENDENT_AMBULATORY_CARE_PROVIDER_SITE_OTHER): Payer: Medicare Other | Admitting: Ophthalmology

## 2018-03-15 DIAGNOSIS — H43813 Vitreous degeneration, bilateral: Secondary | ICD-10-CM

## 2018-03-15 DIAGNOSIS — H353231 Exudative age-related macular degeneration, bilateral, with active choroidal neovascularization: Secondary | ICD-10-CM

## 2018-05-17 ENCOUNTER — Encounter (INDEPENDENT_AMBULATORY_CARE_PROVIDER_SITE_OTHER): Payer: Medicare Other | Admitting: Ophthalmology

## 2018-05-25 ENCOUNTER — Encounter (INDEPENDENT_AMBULATORY_CARE_PROVIDER_SITE_OTHER): Payer: Medicare Other | Admitting: Ophthalmology

## 2018-05-25 DIAGNOSIS — H43813 Vitreous degeneration, bilateral: Secondary | ICD-10-CM

## 2018-05-25 DIAGNOSIS — H353231 Exudative age-related macular degeneration, bilateral, with active choroidal neovascularization: Secondary | ICD-10-CM | POA: Diagnosis not present

## 2018-05-31 ENCOUNTER — Telehealth: Payer: Self-pay | Admitting: *Deleted

## 2018-05-31 NOTE — Telephone Encounter (Signed)
[id number]/IBBCWU Mallory Grant,BSN,RN3,CCM,CN/[phone]/TCT-Patient, history of recent fall striking the rt hand and left arm, last week/pain continued in her groin area and did go to the orthopedic md/does have a mild pelvic fracture but not to the point of being confined to non weight bearing/does have adequate pain medciation.  Does not have need needs at this time but will call if any arises.

## 2018-06-25 ENCOUNTER — Ambulatory Visit: Payer: Medicare Other | Admitting: Cardiovascular Disease

## 2018-06-25 VITALS — BP 120/60 | HR 76 | Ht 66.0 in | Wt 144.0 lb

## 2018-06-25 DIAGNOSIS — I480 Paroxysmal atrial fibrillation: Secondary | ICD-10-CM

## 2018-06-25 NOTE — Patient Instructions (Signed)
Dr Croitoru recommends that you follow-up with him as needed. 

## 2018-06-25 NOTE — Progress Notes (Signed)
Cardiology Office Note    Date:  06/26/2018   ID:  Mallory Grant, DOB 01-Aug-1928, MRN [id number]  PCP:  Shirline Frees, MD  Cardiologist:   Sanda Klein, MD   Chief Complaint  Patient presents with  . Atrial Fibrillation    History of Present Illness:  Mallory Grant is a 85 y.o. female with a history of "lone" paroxysmal atrial fibrillation without recent recurrence.   She has not had any cardiovascular problems since her last appointment.  She had a fall and had a pelvic fracture.  Her husband passed away in 09-01-17, after struggling for a while with dementia.  She continues to receive treatment for wet macular degeneration with intraocular injections every 7 weeks.  She was recently told that she had a little bit of bleeding behind her eye.  The patient specifically denies any chest pain at rest exertion, dyspnea at rest or with exertion, orthopnea, paroxysmal nocturnal dyspnea, syncope, palpitations, focal neurological deficits, intermittent claudication, lower extremity edema, unexplained weight gain, cough, hemoptysis or wheezing.  She had a normal echocardiogram in 2015 with a normal left ventricular ejection fraction. She has a false positive nuclear stress test followed by normal coronary angiography in 2014.  Past Medical History:  Diagnosis Date  . Arthritis    Wrists and knees  . Atrial fibrillation ([place])    "lone", paroxysmal manner  . Atrial tachycardia ([place])   . Baker's cyst    right calf  . Constipation   . GERD (gastroesophageal reflux disease)   . Headache(784.0)   . History of iritis   . History of TMJ syndrome    left side  . Hypothyroidism   . Macular degeneration   . Macular degeneration     Past Surgical History:  Procedure Laterality Date  . DOPPLER ECHOCARDIOGRAPHY    . EXCISION MORTON'S NEUROMA    . EYE SURGERY     bil cataracts w IOL  . KNEE ARTHROSCOPY WITH LATERAL MENISECTOMY  07/08/2012   Procedure: KNEE ARTHROSCOPY WITH  LATERAL MENISECTOMY;  Surgeon: Magnus Sinning, MD;  Location: [facility] [place];  Service: Orthopedics;  Laterality: Right;   Partial    . KNEE ARTHROSCOPY WITH MEDIAL MENISECTOMY  07/08/2012   Procedure: KNEE ARTHROSCOPY WITH MEDIAL MENISECTOMY;  Surgeon: Magnus Sinning, MD;  Location: [facility];  Service: Orthopedics;  Laterality: Right;  partial  . LEFT HEART CATHETERIZATION WITH CORONARY ANGIOGRAM N/A 04/08/2013   Procedure: LEFT HEART CATHETERIZATION WITH CORONARY ANGIOGRAM;  Surgeon: Sanda Klein, MD;  Location: [facility]ATH [facility];  Service: Cardiovascular;  Laterality: N/A;  . nuclear scintography    . TONSILLECTOMY    . WRIST SURGERY      Current Medications: Outpatient Medications Prior to Visit  Medication Sig Dispense Refill  . amitriptyline (ELAVIL) 25 MG tablet Take 1 tablet by mouth daily.    Marland Kitchen aspirin 162 MG EC tablet Take 162 mg by mouth daily.    . Cholecalciferol (VITAMIN D PO) Take 25 Units by mouth 2 (two) times daily.    . fish oil-omega-3 fatty acids 1000 MG capsule Take 1 g by mouth daily.     Marland Kitchen levothyroxine (SYNTHROID, LEVOTHROID) 75 MCG tablet Take 75 mcg by mouth daily.    . Magnesium 250 MG TABS Take 1 tablet by mouth daily.    . Multiple Vitamins-Minerals (PRESERVISION AREDS 2) CAPS Take 1 tablet by mouth 2 (two) times daily.     . polyethylene glycol (MIRALAX /  GLYCOLAX) packet Take 17 g by mouth daily as needed.     . ranitidine (ZANTAC) 150 MG capsule Take 150 mg by mouth 2 (two) times daily.     No facility-administered medications prior to visit.      Allergies:   Diphenhydramine-zinc acetate; Procaine; Benadryl [diphenhydramine hcl]; Codeine; Novocain [procaine hcl]; and Sulfa antibiotics   Social History   Socioeconomic History  . Marital status: Married    Spouse name: Not on file  . Number of children: Not on file  . Years of education: Not on file  . Highest education level: Not on file  Occupational History  . Not  on file  Social Needs  . Financial resource strain: Not on file  . Food insecurity:    Worry: Not on file    Inability: Not on file  . Transportation needs:    Medical: Not on file    Non-medical: Not on file  Tobacco Use  . Smoking status: Never Smoker  . Smokeless tobacco: Never Used  Substance and Sexual Activity  . Alcohol use: Yes    Comment: rare  . Drug use: No  . Sexual activity: Not on file  Lifestyle  . Physical activity:    Days per week: Not on file    Minutes per session: Not on file  . Stress: Not on file  Relationships  . Social connections:    Talks on phone: Not on file    Gets together: Not on file    Attends religious service: Not on file    Active member of club or organization: Not on file    Attends meetings of clubs or organizations: Not on file    Relationship status: Not on file  Other Topics Concern  . Not on file  Social History Narrative  . Not on file     Family History:  The patient's family history includes Alzheimer's disease in her father; Diabetes in her mother.   ROS:   Please see the history of present illness.    ROS all other systems are reviewed and are negative   PHYSICAL EXAM:   VS:  BP 120/60 (BP Location: Left Arm, Patient Position: Sitting, Cuff Size: Normal)   Pulse 76   Ht 5\' 6"  (1.676 m)   Wt 144 lb (65.3 kg)   BMI 23.24 kg/m      General: Alert, oriented x3, no distress, slender Head: no evidence of trauma, PERRL, EOMI, no exophtalmos or lid lag, no myxedema, no xanthelasma; normal ears, nose and oropharynx Neck: normal jugular venous pulsations and no hepatojugular reflux; brisk carotid pulses without delay and no carotid bruits Chest: clear to auscultation, no signs of consolidation by percussion or palpation, normal fremitus, symmetrical and full respiratory excursions Cardiovascular: normal position and quality of the apical impulse, regular rhythm, normal first and second heart sounds, no murmurs, rubs or  gallops Abdomen: no tenderness or distention, no masses by palpation, no abnormal pulsatility or arterial bruits, normal bowel sounds, no hepatosplenomegaly Extremities: no clubbing, cyanosis or edema; 2+ radial, ulnar and brachial pulses bilaterally; 2+ right femoral, posterior tibial and dorsalis pedis pulses; 2+ left femoral, posterior tibial and dorsalis pedis pulses; no subclavian or femoral bruits Neurological: grossly nonfocal Psych: Normal mood and affect    Wt Readings from Last 3 Encounters:  06/25/18 144 lb (65.3 kg)  06/04/17 139 lb (63 kg)  05/21/16 139 lb (63 kg)      Studies/Labs Reviewed:   EKG:  EKG is  ordered today.  It shows sinus rhythm, normal tracing  ASSESSMENT:    1. Paroxysmal atrial fibrillation (HCC)      PLAN:  In order of problems listed above:  AFib: Mrs. Maniaci has a remote history of atrial fibrillation that has not recurred clinically in years.  She has modest to moderately increased embolic risk (CHADSVasc 56 - age and gender), she has never had stroke/TIA or other embolic events.  She is at increased risk of worsening vision due to intraocular hemorrhage.  We have discussed the pros and cons of anticoagulation and decided to continue aspirin only, rather than full anticoagulant therapy.  She is asked to promptly report any focal neurological event.  Will follow-up with cardiology as needed Medication Adjustments/Labs and Tests Ordered: Current medicines are reviewed at length with the patient today.  Concerns regarding medicines are outlined above.  Medication changes, Labs and Tests ordered today are listed in the Patient Instructions below. Patient Instructions  Dr Sallyanne Kuster recommends that you follow-up with him as needed.    Signed, Sanda Klein, MD  06/26/2018 11:59 AM    [facility] [place], [place], [place]  [address] Phone: [phone]; Fax: [phone]

## 2018-06-26 ENCOUNTER — Encounter: Payer: Self-pay | Admitting: Cardiovascular Disease

## 2018-06-28 ENCOUNTER — Telehealth: Payer: Self-pay | Admitting: *Deleted

## 2018-06-28 NOTE — Telephone Encounter (Signed)
[id number] phone /no answer client home with fractured pelvis but is ambulatory. Rhonda Davis,BSN,RN3,CCM,CN.

## 2018-07-26 ENCOUNTER — Encounter (INDEPENDENT_AMBULATORY_CARE_PROVIDER_SITE_OTHER): Payer: Medicare Other | Admitting: Ophthalmology

## 2018-07-27 ENCOUNTER — Encounter (INDEPENDENT_AMBULATORY_CARE_PROVIDER_SITE_OTHER): Payer: Medicare Other | Admitting: Ophthalmology

## 2018-08-02 ENCOUNTER — Encounter (INDEPENDENT_AMBULATORY_CARE_PROVIDER_SITE_OTHER): Payer: Medicare Other | Admitting: Ophthalmology

## 2018-08-02 ENCOUNTER — Telehealth: Payer: Self-pay | Admitting: *Deleted

## 2018-08-02 NOTE — Telephone Encounter (Signed)
tct-Anahita to follow up on her fractured pelvis, she is staying at home and following instructions, goes back to the ortho md on firday for a follow up xray. Velva Harman, BSN,RN3,CCM,CN

## 2018-08-19 ENCOUNTER — Encounter (INDEPENDENT_AMBULATORY_CARE_PROVIDER_SITE_OTHER): Payer: Medicare Other | Admitting: Ophthalmology

## 2018-08-19 DIAGNOSIS — H43813 Vitreous degeneration, bilateral: Secondary | ICD-10-CM | POA: Diagnosis not present

## 2018-08-19 DIAGNOSIS — H35372 Puckering of macula, left eye: Secondary | ICD-10-CM

## 2018-08-19 DIAGNOSIS — H353231 Exudative age-related macular degeneration, bilateral, with active choroidal neovascularization: Secondary | ICD-10-CM

## 2018-08-26 ENCOUNTER — Other Ambulatory Visit: Payer: Self-pay | Admitting: Family Medicine

## 2018-08-26 ENCOUNTER — Ambulatory Visit
Admission: RE | Admit: 2018-08-26 | Discharge: 2018-08-26 | Disposition: A | Discharge status: Home / Self Care | Payer: Medicare Other | Source: Ambulatory Visit | Attending: Family Medicine | Admitting: Family Medicine

## 2018-08-26 DIAGNOSIS — R6884 Jaw pain: Secondary | ICD-10-CM

## 2018-08-28 ENCOUNTER — Other Ambulatory Visit: Payer: Medicare Other

## 2018-09-23 ENCOUNTER — Telehealth: Payer: Self-pay | Admitting: *Deleted

## 2018-09-23 NOTE — Telephone Encounter (Signed)
[id number]/[id number] phone number/message left for any needs to call back at [phone]/Rhonda Davis,BSN,RN3,CCM,CN

## 2018-09-29 ENCOUNTER — Telehealth: Payer: Self-pay | Admitting: *Deleted

## 2018-09-29 NOTE — Telephone Encounter (Signed)
[id number] Burnard Enis,BSN,RN3,CCM,CN: [facility]-Home phone no answer message left on voice machine to return call and card sent to the home address.  The church belives she is staying with son.

## 2018-11-11 ENCOUNTER — Encounter (INDEPENDENT_AMBULATORY_CARE_PROVIDER_SITE_OTHER): Payer: Medicare Other | Admitting: Ophthalmology

## 2018-11-11 ENCOUNTER — Other Ambulatory Visit: Payer: Self-pay

## 2018-11-11 DIAGNOSIS — H35373 Puckering of macula, bilateral: Secondary | ICD-10-CM | POA: Diagnosis not present

## 2018-11-11 DIAGNOSIS — H353231 Exudative age-related macular degeneration, bilateral, with active choroidal neovascularization: Secondary | ICD-10-CM | POA: Diagnosis not present

## 2018-11-11 DIAGNOSIS — H43813 Vitreous degeneration, bilateral: Secondary | ICD-10-CM

## 2019-02-10 ENCOUNTER — Other Ambulatory Visit: Payer: Self-pay

## 2019-02-10 ENCOUNTER — Encounter (INDEPENDENT_AMBULATORY_CARE_PROVIDER_SITE_OTHER): Payer: Medicare Other | Admitting: Ophthalmology

## 2019-02-10 DIAGNOSIS — H35033 Hypertensive retinopathy, bilateral: Secondary | ICD-10-CM | POA: Diagnosis not present

## 2019-02-10 DIAGNOSIS — H353231 Exudative age-related macular degeneration, bilateral, with active choroidal neovascularization: Secondary | ICD-10-CM | POA: Diagnosis not present

## 2019-02-10 DIAGNOSIS — I10 Essential (primary) hypertension: Secondary | ICD-10-CM | POA: Diagnosis not present

## 2019-02-10 DIAGNOSIS — H35373 Puckering of macula, bilateral: Secondary | ICD-10-CM | POA: Diagnosis not present

## 2019-02-10 DIAGNOSIS — H43813 Vitreous degeneration, bilateral: Secondary | ICD-10-CM

## 2019-05-12 ENCOUNTER — Other Ambulatory Visit: Payer: Self-pay

## 2019-05-12 ENCOUNTER — Encounter (INDEPENDENT_AMBULATORY_CARE_PROVIDER_SITE_OTHER): Payer: Medicare Other | Admitting: Ophthalmology

## 2019-05-12 DIAGNOSIS — H43813 Vitreous degeneration, bilateral: Secondary | ICD-10-CM

## 2019-05-12 DIAGNOSIS — H353231 Exudative age-related macular degeneration, bilateral, with active choroidal neovascularization: Secondary | ICD-10-CM | POA: Diagnosis not present

## 2019-05-12 DIAGNOSIS — H318 Other specified disorders of choroid: Secondary | ICD-10-CM | POA: Diagnosis not present

## 2019-06-02 ENCOUNTER — Other Ambulatory Visit: Payer: Self-pay

## 2019-06-02 ENCOUNTER — Encounter (INDEPENDENT_AMBULATORY_CARE_PROVIDER_SITE_OTHER): Payer: Medicare Other | Admitting: Ophthalmology

## 2019-06-02 DIAGNOSIS — H353231 Exudative age-related macular degeneration, bilateral, with active choroidal neovascularization: Secondary | ICD-10-CM

## 2019-06-02 DIAGNOSIS — H43813 Vitreous degeneration, bilateral: Secondary | ICD-10-CM | POA: Diagnosis not present

## 2019-06-14 ENCOUNTER — Encounter (INDEPENDENT_AMBULATORY_CARE_PROVIDER_SITE_OTHER): Payer: Medicare Other | Admitting: Ophthalmology

## 2019-06-14 ENCOUNTER — Other Ambulatory Visit: Payer: Self-pay

## 2019-06-14 DIAGNOSIS — H353211 Exudative age-related macular degeneration, right eye, with active choroidal neovascularization: Secondary | ICD-10-CM | POA: Diagnosis not present

## 2019-06-30 ENCOUNTER — Encounter (INDEPENDENT_AMBULATORY_CARE_PROVIDER_SITE_OTHER): Payer: Medicare Other | Admitting: Ophthalmology

## 2019-07-12 ENCOUNTER — Encounter (INDEPENDENT_AMBULATORY_CARE_PROVIDER_SITE_OTHER): Payer: Medicare Other | Admitting: Ophthalmology

## 2019-07-12 DIAGNOSIS — H353231 Exudative age-related macular degeneration, bilateral, with active choroidal neovascularization: Secondary | ICD-10-CM | POA: Diagnosis not present

## 2019-07-12 DIAGNOSIS — H43813 Vitreous degeneration, bilateral: Secondary | ICD-10-CM

## 2019-08-08 ENCOUNTER — Ambulatory Visit: Payer: Medicare Other | Attending: Internal Medicine

## 2019-08-08 ENCOUNTER — Other Ambulatory Visit: Payer: Self-pay

## 2019-08-08 DIAGNOSIS — Z23 Encounter for immunization: Secondary | ICD-10-CM | POA: Insufficient documentation

## 2019-08-08 NOTE — Progress Notes (Signed)
   Covid-19 Vaccination Clinic  Name:  Mallory Grant    MRN: [id number] DOB: 1929-03-11  08/08/2019  [name] was observed post Covid-19 immunization for 15 minutes without incidence. She was provided with Vaccine Information Sheet and instruction to access the V-Safe system.   [name] was instructed to call 911 with any severe reactions post vaccine: Marland Kitchen Difficulty breathing  . Swelling of your face and throat  . A fast heartbeat  . A bad rash all over your body  . Dizziness and weakness    Immunizations Administered    Name Date Dose VIS Date Route   Moderna COVID-19 Vaccine 08/08/2019 12:12 PM 0.5 mL 05/24/2019 Intramuscular   Manufacturer: Moderna   Lot: [id number]   [id number]

## 2019-08-10 ENCOUNTER — Other Ambulatory Visit: Payer: Self-pay

## 2019-08-10 ENCOUNTER — Encounter (INDEPENDENT_AMBULATORY_CARE_PROVIDER_SITE_OTHER): Payer: Medicare Other | Admitting: Ophthalmology

## 2019-08-10 DIAGNOSIS — H353231 Exudative age-related macular degeneration, bilateral, with active choroidal neovascularization: Secondary | ICD-10-CM | POA: Diagnosis not present

## 2019-08-10 DIAGNOSIS — H43813 Vitreous degeneration, bilateral: Secondary | ICD-10-CM

## 2019-09-06 ENCOUNTER — Ambulatory Visit: Payer: Medicare Other | Attending: Internal Medicine

## 2019-09-06 DIAGNOSIS — Z23 Encounter for immunization: Secondary | ICD-10-CM

## 2019-09-06 NOTE — Progress Notes (Signed)
   Covid-19 Vaccination Clinic  Name:  EVYNN MACLEAN    MRN: [id number] DOB: 1929/05/30  09/06/2019  Ms. Brusky was observed post Covid-19 immunization for 15 minutes without incident. She was provided with Vaccine Information Sheet and instruction to access the V-Safe system.   Ms. Coley was instructed to call 911 with any severe reactions post vaccine: Marland Kitchen Difficulty breathing  . Swelling of face and throat  . A fast heartbeat  . A bad rash all over body  . Dizziness and weakness   Immunizations Administered    Name Date Dose VIS Date Route   Moderna COVID-19 Vaccine 09/06/2019 12:15 PM 0.5 mL 05/24/2019 Intramuscular   Manufacturer: Moderna   Lot: [id number]   [place] LakeVO:[id number]

## 2019-09-08 ENCOUNTER — Other Ambulatory Visit: Payer: Self-pay

## 2019-09-08 ENCOUNTER — Encounter (INDEPENDENT_AMBULATORY_CARE_PROVIDER_SITE_OTHER): Payer: Medicare Other | Admitting: Ophthalmology

## 2019-09-08 DIAGNOSIS — H353231 Exudative age-related macular degeneration, bilateral, with active choroidal neovascularization: Secondary | ICD-10-CM

## 2019-09-08 DIAGNOSIS — H43813 Vitreous degeneration, bilateral: Secondary | ICD-10-CM

## 2019-10-06 ENCOUNTER — Other Ambulatory Visit: Payer: Self-pay

## 2019-10-06 ENCOUNTER — Encounter (INDEPENDENT_AMBULATORY_CARE_PROVIDER_SITE_OTHER): Payer: Medicare Other | Admitting: Ophthalmology

## 2019-10-06 DIAGNOSIS — H353231 Exudative age-related macular degeneration, bilateral, with active choroidal neovascularization: Secondary | ICD-10-CM

## 2019-10-06 DIAGNOSIS — H43813 Vitreous degeneration, bilateral: Secondary | ICD-10-CM

## 2019-11-03 ENCOUNTER — Encounter (INDEPENDENT_AMBULATORY_CARE_PROVIDER_SITE_OTHER): Payer: Medicare Other | Admitting: Ophthalmology

## 2019-11-03 ENCOUNTER — Other Ambulatory Visit: Payer: Self-pay

## 2019-11-03 DIAGNOSIS — H353231 Exudative age-related macular degeneration, bilateral, with active choroidal neovascularization: Secondary | ICD-10-CM | POA: Diagnosis not present

## 2019-11-03 DIAGNOSIS — H43813 Vitreous degeneration, bilateral: Secondary | ICD-10-CM

## 2019-12-01 ENCOUNTER — Other Ambulatory Visit: Payer: Self-pay

## 2019-12-01 ENCOUNTER — Encounter (INDEPENDENT_AMBULATORY_CARE_PROVIDER_SITE_OTHER): Payer: Medicare Other | Admitting: Ophthalmology

## 2019-12-01 DIAGNOSIS — H43813 Vitreous degeneration, bilateral: Secondary | ICD-10-CM

## 2019-12-01 DIAGNOSIS — H353231 Exudative age-related macular degeneration, bilateral, with active choroidal neovascularization: Secondary | ICD-10-CM | POA: Diagnosis not present

## 2019-12-29 ENCOUNTER — Encounter (INDEPENDENT_AMBULATORY_CARE_PROVIDER_SITE_OTHER): Payer: Medicare Other | Admitting: Ophthalmology

## 2019-12-29 ENCOUNTER — Other Ambulatory Visit: Payer: Self-pay

## 2019-12-29 DIAGNOSIS — H43813 Vitreous degeneration, bilateral: Secondary | ICD-10-CM

## 2019-12-29 DIAGNOSIS — H353231 Exudative age-related macular degeneration, bilateral, with active choroidal neovascularization: Secondary | ICD-10-CM

## 2020-02-02 ENCOUNTER — Encounter (INDEPENDENT_AMBULATORY_CARE_PROVIDER_SITE_OTHER): Payer: Medicare Other | Admitting: Ophthalmology

## 2020-02-02 ENCOUNTER — Other Ambulatory Visit: Payer: Self-pay

## 2020-02-02 DIAGNOSIS — H43813 Vitreous degeneration, bilateral: Secondary | ICD-10-CM

## 2020-02-02 DIAGNOSIS — H353231 Exudative age-related macular degeneration, bilateral, with active choroidal neovascularization: Secondary | ICD-10-CM | POA: Diagnosis not present

## 2020-03-07 ENCOUNTER — Encounter (INDEPENDENT_AMBULATORY_CARE_PROVIDER_SITE_OTHER): Payer: Medicare Other | Admitting: Ophthalmology

## 2020-03-07 ENCOUNTER — Other Ambulatory Visit: Payer: Self-pay

## 2020-03-07 DIAGNOSIS — H43813 Vitreous degeneration, bilateral: Secondary | ICD-10-CM | POA: Diagnosis not present

## 2020-03-07 DIAGNOSIS — H353231 Exudative age-related macular degeneration, bilateral, with active choroidal neovascularization: Secondary | ICD-10-CM | POA: Diagnosis not present

## 2020-04-18 ENCOUNTER — Encounter (INDEPENDENT_AMBULATORY_CARE_PROVIDER_SITE_OTHER): Payer: Medicare Other | Admitting: Ophthalmology

## 2020-04-18 DIAGNOSIS — H43813 Vitreous degeneration, bilateral: Secondary | ICD-10-CM

## 2020-04-18 DIAGNOSIS — H353231 Exudative age-related macular degeneration, bilateral, with active choroidal neovascularization: Secondary | ICD-10-CM

## 2020-05-30 ENCOUNTER — Other Ambulatory Visit: Payer: Self-pay

## 2020-05-30 ENCOUNTER — Encounter (INDEPENDENT_AMBULATORY_CARE_PROVIDER_SITE_OTHER): Payer: Medicare Other | Admitting: Ophthalmology

## 2020-05-30 DIAGNOSIS — H353231 Exudative age-related macular degeneration, bilateral, with active choroidal neovascularization: Secondary | ICD-10-CM

## 2020-05-30 DIAGNOSIS — H43813 Vitreous degeneration, bilateral: Secondary | ICD-10-CM

## 2020-06-22 ENCOUNTER — Emergency Department (HOSPITAL_BASED_OUTPATIENT_CLINIC_OR_DEPARTMENT_OTHER): Payer: Medicare Other

## 2020-06-22 ENCOUNTER — Emergency Department (HOSPITAL_BASED_OUTPATIENT_CLINIC_OR_DEPARTMENT_OTHER)
Admission: EM | Admit: 2020-06-22 | Discharge: 2020-06-22 | Disposition: A | Discharge status: Home / Self Care | Payer: Medicare Other | Attending: Emergency Medicine | Admitting: Emergency Medicine

## 2020-06-22 ENCOUNTER — Encounter (HOSPITAL_BASED_OUTPATIENT_CLINIC_OR_DEPARTMENT_OTHER): Payer: Self-pay | Admitting: *Deleted

## 2020-06-22 ENCOUNTER — Other Ambulatory Visit: Payer: Self-pay

## 2020-06-22 DIAGNOSIS — S0101XA Laceration without foreign body of scalp, initial encounter: Secondary | ICD-10-CM

## 2020-06-22 DIAGNOSIS — Z7982 Long term (current) use of aspirin: Secondary | ICD-10-CM | POA: Diagnosis not present

## 2020-06-22 DIAGNOSIS — S0990XA Unspecified injury of head, initial encounter: Secondary | ICD-10-CM | POA: Diagnosis present

## 2020-06-22 DIAGNOSIS — W010XXA Fall on same level from slipping, tripping and stumbling without subsequent striking against object, initial encounter: Secondary | ICD-10-CM | POA: Diagnosis not present

## 2020-06-22 DIAGNOSIS — E039 Hypothyroidism, unspecified: Secondary | ICD-10-CM | POA: Insufficient documentation

## 2020-06-22 DIAGNOSIS — Z79899 Other long term (current) drug therapy: Secondary | ICD-10-CM | POA: Diagnosis not present

## 2020-06-22 DIAGNOSIS — Z23 Encounter for immunization: Secondary | ICD-10-CM | POA: Insufficient documentation

## 2020-06-22 MED ORDER — LIDOCAINE HCL (PF) 1 % IJ SOLN
5.0000 mL | Freq: Once | INTRAMUSCULAR | Status: AC
  Administered 2020-06-22: 5 mL
  Filled 2020-06-22: qty 5

## 2020-06-22 MED ORDER — CEPHALEXIN 500 MG PO CAPS: 500.0000 mg | ORAL_CAPSULE | Freq: Three times a day (TID) | ORAL | 0 refills | Status: DC

## 2020-06-22 MED ORDER — TETANUS-DIPHTH-ACELL PERTUSSIS 5-2.5-18.5 LF-MCG/0.5 IM SUSY
0.5000 mL | PREFILLED_SYRINGE | Freq: Once | INTRAMUSCULAR | Status: AC
  Administered 2020-06-22: 0.5 mL via INTRAMUSCULAR
  Filled 2020-06-22: qty 0.5

## 2020-06-22 MED ORDER — CEPHALEXIN 500 MG PO CAPS: 500.0000 mg | ORAL_CAPSULE | Freq: Three times a day (TID) | ORAL | 0 refills | Status: AC

## 2020-06-22 NOTE — Discharge Instructions (Addendum)
Return or see your primary care doctor or urgent care for staple removal in 10 days.   Return immediately back to the ER if:  Your symptoms worsen within the next 12-24 hours. You develop new symptoms such as new fevers, persistent vomiting, new pain, shortness of breath, or any other concerns.

## 2020-06-22 NOTE — ED Triage Notes (Addendum)
Pt reports she went to sit in a rolling chair and it rolled back and she fell. Lac present to scalp. Pt alert. Bleeding controlled. Denies LOC

## 2020-06-22 NOTE — ED Provider Notes (Signed)
[facility] EMERGENCY DEPARTMENT Provider Note   CSN: [id number] Arrival date & time: 06/22/20  1801     History Chief Complaint  Patient presents with  . Fall    Mallory Grant is a 86 y.o. female.  Patient presents with fall and head injury.  She states that she lost her balance tripped and fell hit the top back of her head on the ground.  Denies loss of consciousness complaining of mild headache.  No fever no vomiting cough or diarrhea.  Otherwise denies neck pain or extremity pain or hip pain or back pain.        Past Medical History:  Diagnosis Date  . Arthritis    Wrists and knees  . Atrial fibrillation ([place])    "lone", paroxysmal manner  . Atrial tachycardia ([place])   . Baker's cyst    right calf  . Constipation   . GERD (gastroesophageal reflux disease)   . Headache(784.0)   . History of iritis   . History of TMJ syndrome    left side  . Hypothyroidism   . Macular degeneration   . Macular degeneration     Patient Active Problem List   Diagnosis Date Noted  . Abnormal nuclear stress test 04/08/2013  . Hyperlipidemia 04/06/2013  . Chest pain with abnormal stress test 03/16/2013  . Paroxysmal atrial fibrillation ([place]) 03/16/2013  . Hypothyroidism 03/16/2013    Past Surgical History:  Procedure Laterality Date  . DOPPLER ECHOCARDIOGRAPHY    . EXCISION MORTON'S NEUROMA    . EYE SURGERY     bil cataracts w IOL  . KNEE ARTHROSCOPY WITH LATERAL MENISECTOMY  07/08/2012   Procedure: KNEE ARTHROSCOPY WITH LATERAL MENISECTOMY;  Surgeon: Magnus Sinning, MD;  Location: [facility];  Service: Orthopedics;  Laterality: Right;   Partial    . KNEE ARTHROSCOPY WITH MEDIAL MENISECTOMY  07/08/2012   Procedure: KNEE ARTHROSCOPY WITH MEDIAL MENISECTOMY;  Surgeon: Magnus Sinning, MD;  Location: [facility];  Service: Orthopedics;  Laterality: Right;  partial  . LEFT HEART CATHETERIZATION WITH CORONARY ANGIOGRAM N/A 04/08/2013    Procedure: LEFT HEART CATHETERIZATION WITH CORONARY ANGIOGRAM;  Surgeon: Sanda Klein, MD;  Location: [facility] CATH LAB;  Service: Cardiovascular;  Laterality: N/A;  . nuclear scintography    . TONSILLECTOMY    . WRIST SURGERY       OB History   No obstetric history on file.     Family History  Problem Relation Age of Onset  . Diabetes Mother   . Alzheimer's disease Father     Social History   Tobacco Use  . Smoking status: Never Smoker  . Smokeless tobacco: Never Used  Substance Use Topics  . Alcohol use: Yes    Comment: rare  . Drug use: No    Home Medications Prior to Admission medications   Medication Sig Start Date End Date Taking? Authorizing Provider  amitriptyline (ELAVIL) 25 MG tablet Take 1 tablet by mouth daily. 04/25/14   [provider]  aspirin 162 MG EC tablet Take 162 mg by mouth daily.    [provider]  cephALEXin (KEFLEX) 500 MG capsule Take 1 capsule (500 mg total) by mouth 3 (three) times daily for 3 days. 06/22/20 06/25/20  Luna Fuse, MD  Cholecalciferol (VITAMIN D PO) Take 25 Units by mouth 2 (two) times daily.    [provider]  fish oil-omega-3 fatty acids 1000 MG capsule Take 1 g by mouth daily.  [provider]  levothyroxine (SYNTHROID, LEVOTHROID) 75 MCG tablet Take 75 mcg by mouth daily.    [provider]  Magnesium 250 MG TABS Take 1 tablet by mouth daily.    [provider]  Multiple Vitamins-Minerals (PRESERVISION AREDS 2) CAPS Take 1 tablet by mouth 2 (two) times daily.     [provider]  polyethylene glycol (MIRALAX / GLYCOLAX) packet Take 17 g by mouth daily as needed.     [provider]  ranitidine (ZANTAC) 150 MG capsule Take 150 mg by mouth 2 (two) times daily.    [provider]    Allergies    Diphenhydramine-zinc acetate, Procaine, Benadryl [diphenhydramine hcl], Codeine, Novocain [procaine hcl], and Sulfa antibiotics  Review of Systems    Review of Systems  Constitutional: Negative for fever.  HENT: Negative for ear pain.   Eyes: Negative for pain.  Respiratory: Negative for cough.   Cardiovascular: Negative for chest pain.  Gastrointestinal: Negative for abdominal pain.  Genitourinary: Negative for flank pain.  Musculoskeletal: Negative for back pain.  Skin: Negative for rash.  Neurological: Positive for headaches.    Physical Exam Updated Vital Signs BP (!) 144/70 (BP Location: Right Arm)   Pulse 63   Temp 98.2 F (36.8 C) (Oral)   Resp 18   Ht 5' 6.5" (1.689 m)   Wt 65.8 kg   SpO2 94%   BMI 23.05 kg/m   Physical Exam Constitutional:      General: She is not in acute distress.    Appearance: Normal appearance.  HENT:     Head: Normocephalic.     Comments: Superior occipital V-shaped laceration approximately 3 cm.    Nose: Nose normal.  Eyes:     Extraocular Movements: Extraocular movements intact.  Cardiovascular:     Rate and Rhythm: Normal rate.  Pulmonary:     Effort: Pulmonary effort is normal.  Musculoskeletal:        General: Normal range of motion.     Cervical back: Normal range of motion.     Comments: Full range of motion of CTL spine without pain or discomfort.  Range of motion of shoulders elbows wrists knees hips and ankles without pain or discomfort.  Neurological:     General: No focal deficit present.     Mental Status: She is alert. Mental status is at baseline.     ED Results / Procedures / Treatments   Labs (all labs ordered are listed, but only abnormal results are displayed) Labs Reviewed - No data to display  EKG None  Radiology CT Head Wo Contrast  Result Date: 06/22/2020 CLINICAL DATA:  Head trauma EXAM: CT HEAD WITHOUT CONTRAST TECHNIQUE: Contiguous axial images were obtained from the base of the skull through the vertex without intravenous contrast. COMPARISON:  None. FINDINGS: Brain: Mild diffuse cerebral atrophy. Ventricular dilatation consistent with  central atrophy. Low-attenuation changes in the deep white matter consistent with small vessel ischemia. No mass-effect or midline shift. No abnormal extra-axial fluid collections. Gray-white matter junctions are distinct. Basal cisterns are not effaced. No acute intracranial hemorrhage. Vascular: Moderate intracranial arterial vascular calcifications. Skull: Calvarium appears intact. Subcutaneous scalp hematoma and laceration over the right posterior vertex. Sinuses/Orbits: Paranasal sinuses and mastoid air cells are clear. Other: None. IMPRESSION: 1. No acute intracranial abnormalities. 2. Chronic atrophy and small vessel ischemia. 3. Subcutaneous scalp hematoma and laceration over the right posterior vertex. Electronically Signed   By: Lucienne Capers M.D.   On: 06/22/2020 19:41  Procedures Procedures (including critical care time)  Medications Ordered in ED Medications  Tdap (BOOSTRIX) injection 0.5 mL (0.5 mLs Intramuscular Given 06/22/20 1942)  lidocaine (PF) (XYLOCAINE) 1 % injection 5 mL (5 mLs Infiltration Given 06/22/20 1944)    ED Course  I have reviewed the triage vital signs and the nursing notes.  Pertinent labs & imaging results that were available during my care of the patient were reviewed by me and considered in my medical decision making (see chart for details).    MDM Rules/Calculators/A&P                          Wound anesthetized with sterile saline and gauze.  Lidocaine 1% no epinephrine still total of 2 cc.  2 staples placed with good approximation of edges.  Patient tolerated procedure well.  Advisable removal in 10 days.  Advised immediate return for fevers vomiting purulent drainage pain or any additional concerns.     Final Clinical Impression(s) / ED Diagnoses Final diagnoses:  Injury of head, initial encounter  Laceration of scalp, initial encounter    Rx / DC Orders ED Discharge Orders         Ordered    cephALEXin (KEFLEX) 500 MG capsule  3  times daily,   Status:  Discontinued        06/22/20 2057    cephALEXin (KEFLEX) 500 MG capsule  3 times daily        06/22/20 2058           Cheryll Cockayne, MD 06/22/20 [phone]

## 2020-06-22 NOTE — ED Notes (Signed)
ED Provider at bedside. 

## 2020-07-11 ENCOUNTER — Other Ambulatory Visit: Payer: Self-pay

## 2020-07-11 ENCOUNTER — Encounter (INDEPENDENT_AMBULATORY_CARE_PROVIDER_SITE_OTHER): Payer: Medicare Other | Admitting: Ophthalmology

## 2020-07-11 DIAGNOSIS — H43813 Vitreous degeneration, bilateral: Secondary | ICD-10-CM | POA: Diagnosis not present

## 2020-07-11 DIAGNOSIS — H353231 Exudative age-related macular degeneration, bilateral, with active choroidal neovascularization: Secondary | ICD-10-CM

## 2020-08-08 ENCOUNTER — Telehealth: Payer: Self-pay | Admitting: *Deleted

## 2020-08-08 NOTE — Telephone Encounter (Signed)
[id number] phone toi check on how newell is doing, last note was sight getting worse.  No answer at the phone message left to return call.

## 2020-08-22 ENCOUNTER — Encounter (INDEPENDENT_AMBULATORY_CARE_PROVIDER_SITE_OTHER): Payer: Medicare Other | Admitting: Ophthalmology

## 2020-08-22 ENCOUNTER — Other Ambulatory Visit: Payer: Self-pay

## 2020-08-22 DIAGNOSIS — H43813 Vitreous degeneration, bilateral: Secondary | ICD-10-CM

## 2020-08-22 DIAGNOSIS — H353231 Exudative age-related macular degeneration, bilateral, with active choroidal neovascularization: Secondary | ICD-10-CM | POA: Diagnosis not present

## 2020-08-23 ENCOUNTER — Telehealth: Payer: Self-pay | Admitting: *Deleted

## 2020-08-23 NOTE — Telephone Encounter (Signed)
[id number]/HWYSH with Kitzia states she is doing good.  Still having issues wih her eyes but is getting a long.  Has good support through family at home.  Church members are helping her to, to get out into the community.  No other issues. Velva Harman, BSN,RN3,CCM,CN

## 2020-11-01 ENCOUNTER — Encounter (INDEPENDENT_AMBULATORY_CARE_PROVIDER_SITE_OTHER): Payer: Medicare Other | Admitting: Ophthalmology

## 2020-11-01 ENCOUNTER — Other Ambulatory Visit: Payer: Self-pay

## 2020-11-01 DIAGNOSIS — H353231 Exudative age-related macular degeneration, bilateral, with active choroidal neovascularization: Secondary | ICD-10-CM | POA: Diagnosis not present

## 2020-11-01 DIAGNOSIS — H43813 Vitreous degeneration, bilateral: Secondary | ICD-10-CM | POA: Diagnosis not present

## 2021-01-10 ENCOUNTER — Other Ambulatory Visit: Payer: Self-pay

## 2021-01-10 ENCOUNTER — Encounter (INDEPENDENT_AMBULATORY_CARE_PROVIDER_SITE_OTHER): Payer: Medicare Other | Admitting: Ophthalmology

## 2021-01-10 DIAGNOSIS — H43813 Vitreous degeneration, bilateral: Secondary | ICD-10-CM | POA: Diagnosis not present

## 2021-01-10 DIAGNOSIS — H353231 Exudative age-related macular degeneration, bilateral, with active choroidal neovascularization: Secondary | ICD-10-CM

## 2021-03-28 ENCOUNTER — Other Ambulatory Visit: Payer: Self-pay

## 2021-03-28 ENCOUNTER — Encounter (INDEPENDENT_AMBULATORY_CARE_PROVIDER_SITE_OTHER): Payer: Medicare Other | Admitting: Ophthalmology

## 2021-03-28 DIAGNOSIS — H353231 Exudative age-related macular degeneration, bilateral, with active choroidal neovascularization: Secondary | ICD-10-CM | POA: Diagnosis not present

## 2021-03-28 DIAGNOSIS — H43813 Vitreous degeneration, bilateral: Secondary | ICD-10-CM | POA: Diagnosis not present

## 2021-06-06 ENCOUNTER — Encounter (INDEPENDENT_AMBULATORY_CARE_PROVIDER_SITE_OTHER): Payer: Medicare Other | Admitting: Ophthalmology

## 2021-06-06 ENCOUNTER — Other Ambulatory Visit: Payer: Self-pay

## 2021-06-06 DIAGNOSIS — H353231 Exudative age-related macular degeneration, bilateral, with active choroidal neovascularization: Secondary | ICD-10-CM | POA: Diagnosis not present

## 2021-06-06 DIAGNOSIS — H43813 Vitreous degeneration, bilateral: Secondary | ICD-10-CM

## 2021-06-06 DIAGNOSIS — H2703 Aphakia, bilateral: Secondary | ICD-10-CM

## 2021-07-28 ENCOUNTER — Encounter (HOSPITAL_BASED_OUTPATIENT_CLINIC_OR_DEPARTMENT_OTHER): Payer: Self-pay | Admitting: *Deleted

## 2021-07-28 ENCOUNTER — Emergency Department (HOSPITAL_BASED_OUTPATIENT_CLINIC_OR_DEPARTMENT_OTHER): Payer: Medicare Other

## 2021-07-28 ENCOUNTER — Observation Stay (HOSPITAL_COMMUNITY): Payer: Medicare Other

## 2021-07-28 ENCOUNTER — Other Ambulatory Visit: Payer: Self-pay

## 2021-07-28 ENCOUNTER — Inpatient Hospital Stay (HOSPITAL_BASED_OUTPATIENT_CLINIC_OR_DEPARTMENT_OTHER)
Admission: EM | Admit: 2021-07-28 | Discharge: 2021-08-02 | DRG: 871 | Disposition: A | Discharge status: Home Health | Payer: Medicare Other | Source: Skilled Nursing Facility | Attending: Internal Medicine | Admitting: Internal Medicine

## 2021-07-28 DIAGNOSIS — R21 Rash and other nonspecific skin eruption: Secondary | ICD-10-CM | POA: Diagnosis present

## 2021-07-28 DIAGNOSIS — R0609 Other forms of dyspnea: Secondary | ICD-10-CM | POA: Diagnosis not present

## 2021-07-28 DIAGNOSIS — Z882 Allergy status to sulfonamides status: Secondary | ICD-10-CM

## 2021-07-28 DIAGNOSIS — J9601 Acute respiratory failure with hypoxia: Secondary | ICD-10-CM | POA: Diagnosis not present

## 2021-07-28 DIAGNOSIS — G9341 Metabolic encephalopathy: Secondary | ICD-10-CM | POA: Diagnosis not present

## 2021-07-28 DIAGNOSIS — R0902 Hypoxemia: Secondary | ICD-10-CM

## 2021-07-28 DIAGNOSIS — I959 Hypotension, unspecified: Secondary | ICD-10-CM | POA: Diagnosis not present

## 2021-07-28 DIAGNOSIS — R197 Diarrhea, unspecified: Secondary | ICD-10-CM

## 2021-07-28 DIAGNOSIS — E869 Volume depletion, unspecified: Secondary | ICD-10-CM | POA: Diagnosis present

## 2021-07-28 DIAGNOSIS — R5381 Other malaise: Secondary | ICD-10-CM | POA: Diagnosis not present

## 2021-07-28 DIAGNOSIS — I480 Paroxysmal atrial fibrillation: Secondary | ICD-10-CM | POA: Diagnosis present

## 2021-07-28 DIAGNOSIS — E871 Hypo-osmolality and hyponatremia: Secondary | ICD-10-CM | POA: Diagnosis not present

## 2021-07-28 DIAGNOSIS — H3530 Unspecified macular degeneration: Secondary | ICD-10-CM | POA: Diagnosis not present

## 2021-07-28 DIAGNOSIS — F0390 Unspecified dementia without behavioral disturbance: Secondary | ICD-10-CM | POA: Diagnosis present

## 2021-07-28 DIAGNOSIS — E876 Hypokalemia: Secondary | ICD-10-CM

## 2021-07-28 DIAGNOSIS — A4189 Other specified sepsis: Principal | ICD-10-CM | POA: Diagnosis present

## 2021-07-28 DIAGNOSIS — R262 Difficulty in walking, not elsewhere classified: Secondary | ICD-10-CM | POA: Diagnosis present

## 2021-07-28 DIAGNOSIS — Z9181 History of falling: Secondary | ICD-10-CM

## 2021-07-28 DIAGNOSIS — K219 Gastro-esophageal reflux disease without esophagitis: Secondary | ICD-10-CM | POA: Diagnosis present

## 2021-07-28 DIAGNOSIS — A0472 Enterocolitis due to Clostridium difficile, not specified as recurrent: Secondary | ICD-10-CM | POA: Diagnosis present

## 2021-07-28 DIAGNOSIS — Z79890 Hormone replacement therapy (postmenopausal): Secondary | ICD-10-CM

## 2021-07-28 DIAGNOSIS — R6520 Severe sepsis without septic shock: Secondary | ICD-10-CM | POA: Diagnosis not present

## 2021-07-28 DIAGNOSIS — Z885 Allergy status to narcotic agent status: Secondary | ICD-10-CM

## 2021-07-28 DIAGNOSIS — Z20822 Contact with and (suspected) exposure to covid-19: Secondary | ICD-10-CM | POA: Diagnosis not present

## 2021-07-28 DIAGNOSIS — R531 Weakness: Secondary | ICD-10-CM

## 2021-07-28 DIAGNOSIS — A419 Sepsis, unspecified organism: Secondary | ICD-10-CM | POA: Diagnosis not present

## 2021-07-28 DIAGNOSIS — L089 Local infection of the skin and subcutaneous tissue, unspecified: Secondary | ICD-10-CM | POA: Diagnosis not present

## 2021-07-28 DIAGNOSIS — R079 Chest pain, unspecified: Secondary | ICD-10-CM | POA: Diagnosis not present

## 2021-07-28 DIAGNOSIS — M170 Bilateral primary osteoarthritis of knee: Secondary | ICD-10-CM | POA: Diagnosis not present

## 2021-07-28 DIAGNOSIS — Z888 Allergy status to other drugs, medicaments and biological substances status: Secondary | ICD-10-CM

## 2021-07-28 DIAGNOSIS — E039 Hypothyroidism, unspecified: Secondary | ICD-10-CM | POA: Diagnosis not present

## 2021-07-28 DIAGNOSIS — Z7982 Long term (current) use of aspirin: Secondary | ICD-10-CM

## 2021-07-28 DIAGNOSIS — I4891 Unspecified atrial fibrillation: Secondary | ICD-10-CM | POA: Diagnosis not present

## 2021-07-28 DIAGNOSIS — G9340 Encephalopathy, unspecified: Secondary | ICD-10-CM

## 2021-07-28 DIAGNOSIS — Z79899 Other long term (current) drug therapy: Secondary | ICD-10-CM

## 2021-07-28 DIAGNOSIS — Z820 Family history of epilepsy and other diseases of the nervous system: Secondary | ICD-10-CM

## 2021-07-28 DIAGNOSIS — A09 Infectious gastroenteritis and colitis, unspecified: Secondary | ICD-10-CM | POA: Diagnosis present

## 2021-07-28 DIAGNOSIS — Z884 Allergy status to anesthetic agent status: Secondary | ICD-10-CM | POA: Diagnosis not present

## 2021-07-28 DIAGNOSIS — R413 Other amnesia: Secondary | ICD-10-CM | POA: Diagnosis not present

## 2021-07-28 DIAGNOSIS — Z833 Family history of diabetes mellitus: Secondary | ICD-10-CM

## 2021-07-28 HISTORY — DX: Unspecified dementia, unspecified severity, without behavioral disturbance, psychotic disturbance, mood disturbance, and anxiety: F03.90

## 2021-07-28 LAB — COMPREHENSIVE METABOLIC PANEL
ALT: 22 U/L (ref 0–44)
AST: 31 U/L (ref 15–41)
Albumin: 3.7 g/dL (ref 3.5–5.0)
Alkaline Phosphatase: 117 U/L (ref 38–126)
Anion gap: 8 (ref 5–15)
BUN: 13 mg/dL (ref 8–23)
CO2: 27 mmol/L (ref 22–32)
Calcium: 8.9 mg/dL (ref 8.9–10.3)
Chloride: 97 mmol/L — ABNORMAL LOW (ref 98–111)
Creatinine, Ser: 0.91 mg/dL (ref 0.44–1.00)
GFR, Estimated: 60 mL/min — ABNORMAL LOW (ref >60)
Glucose, Bld: 117 mg/dL — ABNORMAL HIGH (ref 70–99)
Potassium: 3.5 mmol/L (ref 3.5–5.1)
Sodium: 132 mmol/L — ABNORMAL LOW (ref 135–145)
Total Bilirubin: 0.9 mg/dL (ref 0.3–1.2)
Total Protein: 7 g/dL (ref 6.5–8.1)

## 2021-07-28 LAB — RESP PANEL BY RT-PCR (FLU A&B, COVID) ARPGX2
Influenza A by PCR: NEGATIVE
Influenza B by PCR: NEGATIVE
SARS Coronavirus 2 by RT PCR: NEGATIVE

## 2021-07-28 LAB — URINALYSIS, ROUTINE W REFLEX MICROSCOPIC
Bilirubin Urine: NEGATIVE
Glucose, UA: NEGATIVE mg/dL
Hgb urine dipstick: NEGATIVE
Ketones, ur: NEGATIVE mg/dL
Leukocytes,Ua: NEGATIVE
Nitrite: NEGATIVE
Protein, ur: 30 mg/dL — AB
Specific Gravity, Urine: >=1.03 (ref 1.005–1.030)
pH: 6 (ref 5.0–8.0)

## 2021-07-28 LAB — URINALYSIS, MICROSCOPIC (REFLEX)

## 2021-07-28 LAB — CBC WITH DIFFERENTIAL/PLATELET
Abs Immature Granulocytes: 0.19 10*3/uL — ABNORMAL HIGH (ref 0.00–0.07)
Basophils Absolute: 0.1 10*3/uL (ref 0.0–0.1)
Basophils Relative: 0 %
Eosinophils Absolute: 0 10*3/uL (ref 0.0–0.5)
Eosinophils Relative: 0 %
HCT: 37.9 % (ref 36.0–46.0)
Hemoglobin: 12.7 g/dL (ref 12.0–15.0)
Immature Granulocytes: 1 %
Lymphocytes Relative: 4 %
Lymphs Abs: 1.1 10*3/uL (ref 0.7–4.0)
MCH: 31.7 pg (ref 26.0–34.0)
MCHC: 33.5 g/dL (ref 30.0–36.0)
MCV: 94.5 fL (ref 80.0–100.0)
Monocytes Absolute: 1.4 10*3/uL — ABNORMAL HIGH (ref 0.1–1.0)
Monocytes Relative: 6 %
Neutro Abs: 21.6 10*3/uL — ABNORMAL HIGH (ref 1.7–7.7)
Neutrophils Relative %: 89 %
Platelets: 239 10*3/uL (ref 150–400)
RBC: 4.01 MIL/uL (ref 3.87–5.11)
RDW: 13.9 % (ref 11.5–15.5)
WBC: 24.4 10*3/uL — ABNORMAL HIGH (ref 4.0–10.5)
nRBC: 0 % (ref 0.0–0.2)

## 2021-07-28 LAB — LACTIC ACID, PLASMA: Lactic Acid, Venous: 2.4 mmol/L (ref 0.5–1.9)

## 2021-07-28 MED ORDER — ACETAMINOPHEN 650 MG RE SUPP: 650.0000 mg | Freq: Four times a day (QID) | RECTAL | Status: DC | PRN

## 2021-07-28 MED ORDER — ORAL CARE MOUTH RINSE
15.0000 mL | Freq: Two times a day (BID) | OROMUCOSAL | Status: DC
  Administered 2021-07-28 – 2021-08-02 (×10): 15 mL via OROMUCOSAL

## 2021-07-28 MED ORDER — SODIUM CHLORIDE 0.9 % IV SOLN
2.0000 g | Freq: Once | INTRAVENOUS | Status: AC
  Administered 2021-07-28: 2 g via INTRAVENOUS
  Filled 2021-07-28: qty 2

## 2021-07-28 MED ORDER — VANCOMYCIN HCL IN DEXTROSE 1-5 GM/200ML-% IV SOLN
1000.0000 mg | Freq: Once | INTRAVENOUS | Status: AC
  Administered 2021-07-28: 1000 mg via INTRAVENOUS
  Filled 2021-07-28: qty 200

## 2021-07-28 MED ORDER — ONDANSETRON HCL 4 MG PO TABS: 4.0000 mg | ORAL_TABLET | Freq: Four times a day (QID) | ORAL | Status: DC | PRN

## 2021-07-28 MED ORDER — ACETAMINOPHEN 325 MG PO TABS
650.0000 mg | ORAL_TABLET | Freq: Four times a day (QID) | ORAL | Status: DC | PRN
  Administered 2021-07-30: 650 mg via ORAL
  Filled 2021-07-28: qty 2

## 2021-07-28 MED ORDER — ONDANSETRON HCL 4 MG/2ML IJ SOLN
4.0000 mg | Freq: Four times a day (QID) | INTRAMUSCULAR | Status: DC | PRN
  Administered 2021-07-29: 4 mg via INTRAVENOUS
  Filled 2021-07-28: qty 2

## 2021-07-28 MED ORDER — SODIUM CHLORIDE 0.9 % IV SOLN
2.0000 g | Freq: Two times a day (BID) | INTRAVENOUS | Status: DC
  Administered 2021-07-29: 2 g via INTRAVENOUS
  Filled 2021-07-28: qty 2

## 2021-07-28 MED ORDER — ENOXAPARIN SODIUM 40 MG/0.4ML IJ SOSY
40.0000 mg | PREFILLED_SYRINGE | INTRAMUSCULAR | Status: DC
  Administered 2021-07-29 – 2021-08-01 (×4): 40 mg via SUBCUTANEOUS
  Filled 2021-07-28 (×5): qty 0.4

## 2021-07-28 MED ORDER — SODIUM CHLORIDE 0.9 % IV BOLUS
1000.0000 mL | Freq: Once | INTRAVENOUS | Status: AC
  Administered 2021-07-28: 1000 mL via INTRAVENOUS

## 2021-07-28 MED ORDER — VANCOMYCIN HCL 1750 MG/350ML IV SOLN
1750.0000 mg | INTRAVENOUS | Status: DC
  Filled 2021-07-28: qty 350

## 2021-07-28 MED ORDER — METRONIDAZOLE 500 MG/100ML IV SOLN
500.0000 mg | Freq: Once | INTRAVENOUS | Status: AC
  Administered 2021-07-28: 500 mg via INTRAVENOUS
  Filled 2021-07-28: qty 100

## 2021-07-28 MED ORDER — LACTATED RINGERS IV BOLUS
1000.0000 mL | Freq: Once | INTRAVENOUS | Status: AC
Start: 2021-07-28 — End: 2021-07-28
  Administered 2021-07-28: 1000 mL via INTRAVENOUS

## 2021-07-28 MED ORDER — ACETAMINOPHEN 325 MG PO TABS
650.0000 mg | ORAL_TABLET | Freq: Once | ORAL | Status: AC
  Administered 2021-07-28: 650 mg via ORAL
  Filled 2021-07-28: qty 2

## 2021-07-28 MED ORDER — LACTATED RINGERS IV SOLN: INTRAVENOUS | Status: DC

## 2021-07-28 MED ORDER — LOPERAMIDE HCL 2 MG PO CAPS
2.0000 mg | ORAL_CAPSULE | ORAL | Status: DC | PRN
Start: 2021-07-28 — End: 2021-07-29
  Administered 2021-07-28 – 2021-07-29 (×2): 2 mg via ORAL
  Filled 2021-07-28 (×2): qty 1

## 2021-07-28 MED ORDER — METRONIDAZOLE 500 MG/100ML IV SOLN
500.0000 mg | Freq: Two times a day (BID) | INTRAVENOUS | Status: DC
  Administered 2021-07-29: 500 mg via INTRAVENOUS
  Filled 2021-07-28: qty 100

## 2021-07-28 NOTE — Assessment & Plan Note (Addendum)
Currently at baseline.  Patient does have a history of chronic dementia.  Improved encephalopathy at this time.Marland Kitchen

## 2021-07-28 NOTE — Progress Notes (Signed)
Pharmacy Antibiotic Note  Mallory Grant is a 88 y.o. female admitted on 07/28/2021 with sepsis.  Pharmacy has been consulted for Cefepime and vancomycin dosing.  WBC elevated. SCr wnl. CrCL ~ 38 mL/min   Plan: -Cefepime 2 gm IV Q 12 hours -Vancomycin 1 g IV given in ED. Will order Vancomycin 1740 mg IV Q 48 hrs to follow. Goal AUC 400-550. Expected AUC: 514 SCr used: 0.91 -Monitor CBC, renal fx, cultures and clinical progress -Vanc levels at steady state    Height: 5\' 6"  (167.6 cm) Weight: 65.8 kg (145 lb 1 oz) IBW/kg (Calculated) : 59.3  Temp (24hrs), Avg:99.4 F (37.4 C), Min:98.2 F (36.8 C), Max:100.5 F (38.1 C)  Recent Labs  Lab 07/28/21 1450  WBC 24.4*  CREATININE 0.91    Estimated Creatinine Clearance: 38.5 mL/min (by C-G formula based on SCr of 0.91 mg/dL).    Allergies  Allergen Reactions   Diphenhydramine-Zinc Acetate     Other reaction(s): Other (See Comments) Makes hyper   Procaine     Other reaction(s): Other (See Comments) Makes heart race    Benadryl [Diphenhydramine Hcl] Other (See Comments)    hyperactivity   Codeine Nausea And Vomiting and Nausea Only   Novocain [Procaine Hcl] Other (See Comments)    tachycardia   Sulfa Antibiotics Nausea And Vomiting and Nausea Only         Antimicrobials this admission: Cefepime 2/5 >>  Vancomycin 2/5 >>  Metronidazole 2/5 >>   Dose adjustments this admission:   Microbiology results: 2/5 BCx:    Thank you for allowing pharmacy to be a part of this patients care.  Albertina Parr, PharmD., BCCCP Clinical Pharmacist Please refer to [facility] for unit-specific pharmacist

## 2021-07-28 NOTE — Assessment & Plan Note (Addendum)
With rapid ventricular response.  Patient required Cardizem drip overnight.  We will continue for now.  Not on anticoagulation as outpatient.  Patient is not on rate control medications at home.  We will start on Cardizem p.o. every 6 wean Cardizem drip.  We will check 2D echocardiogram.  Check TSH.

## 2021-07-28 NOTE — ED Provider Notes (Signed)
[facility] Provider Note   CSN: [id number] Arrival date & time: 07/28/21  1425     History  Chief Complaint  Patient presents with   Lytle Michaels    Mallory Grant is a 88 y.o. female.  88 yo F with a chief complaints of a fall.  The patient was noted this morning to be altered compared with her baseline.  She is in a memory care unit and has had frequent episodes where she ends up sliding out of her bed.  Had an episode like that this morning about 1 AM.  The son called her and his conversation with her was not consistent with her baseline.  Answering mostly in yes and no answers she said normally she is quite conversant.  They also were unable to ambulate her today.  Normally ambulates with a walker and was unable to stand up and walk.  She had developed some lesions on her legs that her family doctor was concerned for a possible skin infection and so she was started on antibiotics.  Other than that they deny any recent change in medications.  No noted cough or fever.  No nausea vomiting or diarrhea.  No decreased oral intake   Fall      Home Medications Prior to Admission medications   Medication Sig Start Date End Date Taking? Authorizing Provider  acetaminophen (TYLENOL) 325 MG tablet Take by mouth every 6 (six) hours as needed for mild pain.   Yes [provider]  cephALEXin (KEFLEX) 500 MG capsule Take 500 mg by mouth 2 (two) times daily. Started 2/2 for 10 days   Yes [provider]  Cholecalciferol (VITAMIN D PO) Take 2,000 Units by mouth daily.   Yes [provider]  donepezil (ARICEPT) 10 MG tablet Take 10 mg by mouth at bedtime.   Yes [provider]  levothyroxine (SYNTHROID) 50 MCG tablet Take 50 mcg by mouth daily.   Yes [provider]  Multiple Vitamins-Minerals (PRESERVISION AREDS 2) CAPS Take 2 tablets by mouth daily.   Yes [provider]  polyethylene glycol (MIRALAX / GLYCOLAX) packet  Take 17 g by mouth daily.   Yes [provider]  sertraline (ZOLOFT) 100 MG tablet Take 100 mg by mouth daily.   Yes [provider]  traZODone (DESYREL) 100 MG tablet Take 100 mg by mouth at bedtime.   Yes [provider]      Allergies    Diphenhydramine-zinc acetate, Procaine, Benadryl [diphenhydramine hcl], Codeine, Novocain [procaine hcl], and Sulfa antibiotics    Review of Systems   Review of Systems  Physical Exam Updated Vital Signs BP (!) 111/47 (BP Location: Right Arm)    Pulse 80    Temp 98.2 F (36.8 C) (Oral)    Resp 18    Ht 5\' 6"  (1.676 m)    Wt 65.8 kg    SpO2 96%    BMI 23.41 kg/m  Physical Exam Vitals and nursing note reviewed.  Constitutional:      General: She is not in acute distress.    Appearance: She is well-developed. She is not diaphoretic.  HENT:     Head: Normocephalic and atraumatic.  Eyes:     Pupils: Pupils are equal, round, and reactive to light.  Cardiovascular:     Rate and Rhythm: Normal rate and regular rhythm.     Heart sounds: No murmur heard.   No friction rub. No gallop.  Pulmonary:  Effort: Pulmonary effort is normal.     Breath sounds: No wheezing or rales.  Abdominal:     General: There is no distension.     Palpations: Abdomen is soft.     Tenderness: There is no abdominal tenderness.  Musculoskeletal:        General: No tenderness.     Cervical back: Normal range of motion and neck supple.  Skin:    General: Skin is warm and dry.  Neurological:     Mental Status: She is alert.     Comments: Answers direct yes/no questions.  Psychiatric:        Behavior: Behavior normal.    ED Results / Procedures / Treatments   Labs (all labs ordered are listed, but only abnormal results are displayed) Labs Reviewed  C DIFFICILE QUICK SCREEN W PCR REFLEX   - Abnormal; Notable for the following components:      Result Value   C Diff antigen POSITIVE (*)    C Diff toxin POSITIVE (*)    All other components  within normal limits  CBC WITH DIFFERENTIAL/PLATELET - Abnormal; Notable for the following components:   WBC 24.4 (*)    Neutro Abs 21.6 (*)    Monocytes Absolute 1.4 (*)    Abs Immature Granulocytes 0.19 (*)    All other components within normal limits  COMPREHENSIVE METABOLIC PANEL - Abnormal; Notable for the following components:   Sodium 132 (*)    Chloride 97 (*)    Glucose, Bld 117 (*)    GFR, Estimated 60 (*)    All other components within normal limits  URINALYSIS, ROUTINE W REFLEX MICROSCOPIC - Abnormal; Notable for the following components:   APPearance HAZY (*)    Protein, ur 30 (*)    All other components within normal limits  LACTIC ACID, PLASMA - Abnormal; Notable for the following components:   Lactic Acid, Venous 2.4 (*)    All other components within normal limits  URINALYSIS, MICROSCOPIC (REFLEX) - Abnormal; Notable for the following components:   Bacteria, UA RARE (*)    All other components within normal limits  CBC - Abnormal; Notable for the following components:   WBC 23.8 (*)    RBC 3.84 (*)    All other components within normal limits  COMPREHENSIVE METABOLIC PANEL - Abnormal; Notable for the following components:   Sodium 131 (*)    Potassium 3.3 (*)    Glucose, Bld 119 (*)    Calcium 8.5 (*)    Total Protein 6.2 (*)    Albumin 3.3 (*)    All other components within normal limits  CORTISOL-AM, BLOOD - Abnormal; Notable for the following components:   Cortisol - AM 36.9 (*)    All other components within normal limits  CBC - Abnormal; Notable for the following components:   WBC 19.8 (*)    All other components within normal limits  COMPREHENSIVE METABOLIC PANEL - Abnormal; Notable for the following components:   Sodium 130 (*)    Glucose, Bld 118 (*)    Calcium 8.5 (*)    Total Protein 5.8 (*)    Albumin 2.9 (*)    All other components within normal limits  CBC - Abnormal; Notable for the following components:   WBC 16.8 (*)    RBC 3.53 (*)     Hemoglobin 11.2 (*)    HCT 34.3 (*)    All other components within normal limits  COMPREHENSIVE METABOLIC PANEL - Abnormal; Notable  for the following components:   Sodium 130 (*)    Glucose, Bld 106 (*)    Calcium 8.2 (*)    Total Protein 5.1 (*)    Albumin 2.4 (*)    All other components within normal limits  PHOSPHORUS - Abnormal; Notable for the following components:   Phosphorus 1.6 (*)    All other components within normal limits  RESP PANEL BY RT-PCR (FLU A&B, COVID) ARPGX2  CULTURE, BLOOD (ROUTINE X 2)  CULTURE, BLOOD (ROUTINE X 2)  GASTROINTESTINAL PANEL BY PCR, STOOL (REPLACES STOOL CULTURE)  MRSA NEXT GEN BY PCR, NASAL  LACTIC ACID, PLASMA  PROCALCITONIN  PROTIME-INR  MAGNESIUM  TSH  MAGNESIUM  PROCALCITONIN    EKG EKG Interpretation  Date/Time:  Sunday July 28 2021 15:20:11 EST Ventricular Rate:  75 PR Interval:  173 QRS Duration: 84 QT Interval:  387 QTC Calculation: 433 R Axis:   17 Text Interpretation: Duplicate Confirmed by Sheldon, Charles ([id number]) on 07/28/2021 3:28:45 PM  Radiology DG CHEST PORT 1 VIEW  Result Date: 07/30/2021 CLINICAL DATA:  Hypoxia. EXAM: PORTABLE CHEST 1 VIEW COMPARISON:  Radiographs 07/28/2021 FINDINGS: 1001 hours. The heart size and mediastinal contours are stable with aortic atherosclerosis. The diffuse pulmonary opacities seen on the most recent study have partially cleared, most consistent with resolving edema. There is residual asymmetric airspace disease in the left lower lobe with associated mild volume loss. There may be a small left pleural effusion. No evidence of pneumothorax. The bones appear unchanged. Telemetry leads overlie the chest. IMPRESSION: 1. Resolving diffuse pulmonary opacities consistent with resolving edema. 2. Residual focal left lower lobe airspace disease with volume loss and a possible small left pleural effusion. This is suspicious for possible aspiration. Continued follow-up recommended. Electronically  Signed   By: William  Veazey M.D.   On: 07/30/2021 10:43   ECHOCARDIOGRAM COMPLETE  Result Date: 07/30/2021    ECHOCARDIOGRAM REPORT   Patient Name:   Mallory Grant Date of Exam: 07/30/2021 Medical Rec #:  [id number]     Height:       66.0 in Accession #:    [id number]    Weight:       145.1 lb Date of Birth:  10/12/1928    BSA:          1.745 m Patient Age:    89 years      BP:           94/49 mmHg Patient Gender: F             HR:           87  bpm. Exam Location:  Inpatient Procedure: 2D Echo Indications:    Dyspnea  History:        Patient has prior history of Echocardiogram examinations, most                 recent 04/19/2014. Arrythmias:Atrial Fibrillation.  Sonographer:    Jefferey Pica Referring Phys: [id number] [employer]  1. Left ventricular ejection fraction, by estimation, is 65 to 70%. The left ventricle has normal function. The left ventricle has no regional wall motion abnormalities. Left ventricular diastolic function could not be evaluated.  2. Right ventricular systolic function is normal. The right ventricular size is normal. There is normal pulmonary artery systolic pressure.  3. The mitral valve is normal in structure. Mild mitral valve regurgitation.  4. The aortic valve is tricuspid. Aortic valve regurgitation is not visualized. Aortic valve sclerosis is present, with  no evidence of aortic valve stenosis.  5. The inferior vena cava is dilated in size with >50% respiratory variability, suggesting right atrial pressure of 8 mmHg. Comparison(s): Prior images unable to be directly viewed, comparison made by report only. FINDINGS  Left Ventricle: Left ventricular ejection fraction, by estimation, is 65 to 70%. The left ventricle has normal function. The left ventricle has no regional wall motion abnormalities. The left ventricular internal cavity size was normal in size. There is  no left ventricular hypertrophy. Left ventricular diastolic function could not be evaluated due to  atrial fibrillation. Left ventricular diastolic function could not be evaluated. Right Ventricle: The right ventricular size is normal. No increase in right ventricular wall thickness. Right ventricular systolic function is normal. There is normal pulmonary artery systolic pressure. The tricuspid regurgitant velocity is 2.46 m/s, and  with an assumed right atrial pressure of 8 mmHg, the estimated right ventricular systolic pressure is 60.6 mmHg. Left Atrium: Left atrial size was normal in size. Right Atrium: Right atrial size was normal in size. Pericardium: There is no evidence of pericardial effusion. Mitral Valve: The mitral valve is normal in structure. Mild mitral valve regurgitation, with centrally-directed jet. Tricuspid Valve: The tricuspid valve is normal in structure. Tricuspid valve regurgitation is mild. Aortic Valve: The aortic valve is tricuspid. Aortic valve regurgitation is not visualized. Aortic valve sclerosis is present, with no evidence of aortic valve stenosis. Aortic valve peak gradient measures 6.2 mmHg. Pulmonic Valve: The pulmonic valve was normal in structure. Pulmonic valve regurgitation is not visualized. Aorta: The aortic root and ascending aorta are structurally normal, with no evidence of dilitation. Venous: The inferior vena cava is dilated in size with greater than 50% respiratory variability, suggesting right atrial pressure of 8 mmHg. IAS/Shunts: No atrial level shunt detected by color flow Doppler.  LEFT VENTRICLE PLAX 2D LVIDd:         3.90 cm   Diastology LVIDs:         2.20 cm   LV e' medial:    8.76 cm/s LV PW:         0.90 cm   LV E/e' medial:  11.8 LV IVS:        0.90 cm   LV e' lateral:   11.50 cm/s LVOT diam:     1.80 cm   LV E/e' lateral: 9.0 LV SV:         45 LV SV Index:   26 LVOT Area:     2.54 cm  RIGHT VENTRICLE             IVC RV Basal diam:  3.54 cm     IVC diam: 2.40 cm RV S prime:     11.50 cm/s TAPSE (M-mode): 1.5 cm LEFT ATRIUM             Index        RIGHT  ATRIUM           Index LA diam:        3.10 cm 1.78 cm/m   RA Area:     10.90 cm LA Vol (A2C):   47.4 ml 27.17 ml/m  RA Volume:   21.10 ml  12.09 ml/m LA Vol (A4C):   39.1 ml 22.41 ml/m LA Biplane Vol: 44.5 ml 25.51 ml/m  AORTIC VALVE                 PULMONIC VALVE AV Area (Vmax): 2.18 cm     PV Vmax:  0.73 m/s AV Vmax:        125.00 cm/s  PV Peak grad:  2.1 mmHg AV Peak Grad:   6.2 mmHg LVOT Vmax:      107.00 cm/s LVOT Vmean:     65.700 cm/s LVOT VTI:       0.178 m  AORTA Ao Root diam: 3.10 cm Ao Asc diam:  3.20 cm MITRAL VALVE                TRICUSPID VALVE MV Area (PHT): 3.46 cm     TR Peak grad:   24.2 mmHg MV Decel Time: 219 msec     TR Vmax:        246.00 cm/s MR Peak grad: 80.3 mmHg MR Vmax:      448.00 cm/s   SHUNTS MV E velocity: 103.00 cm/s  Systemic VTI:  0.18 m                             Systemic Diam: 1.80 cm Mihai Croitoru MD Electronically signed by Sanda Klein MD Signature Date/Time: 07/30/2021/4:45:18 PM    Final     Procedures Procedures    Medications Ordered in ED Medications  enoxaparin (LOVENOX) injection 40 mg (40 mg Subcutaneous Given 07/30/21 0957)  ondansetron (ZOFRAN) tablet 4 mg ( Oral See Alternative 07/29/21 1024)    Or  ondansetron (ZOFRAN) injection 4 mg (4 mg Intravenous Given 07/29/21 1024)  MEDLINE mouth rinse (15 mLs Mouth Rinse Given 07/30/21 2143)  vancomycin (VANCOCIN) capsule 125 mg (125 mg Oral Given 07/30/21 2300)  donepezil (ARICEPT) tablet 10 mg (10 mg Oral Given 07/30/21 2143)  levothyroxine (SYNTHROID) tablet 50 mcg (50 mcg Oral Given 07/31/21 0555)  sertraline (ZOLOFT) tablet 100 mg (100 mg Oral Not Given 07/30/21 1042)  traZODone (DESYREL) tablet 100 mg (100 mg Oral Given 07/30/21 2143)  cholecalciferol (VITAMIN D3) tablet 2,000 Units (2,000 Units Oral Given 07/30/21 0953)  diltiazem (CARDIZEM) 125 mg in dextrose 5% 125 mL (1 mg/mL) infusion (0 mg/hr Intravenous Stopped 07/30/21 1646)  acetaminophen (TYLENOL) tablet 650 mg (has no administration in time  range)    Or  acetaminophen (TYLENOL) suppository 650 mg (has no administration in time range)  diltiazem (CARDIZEM) tablet 30 mg (30 mg Oral Given 07/31/21 0555)  potassium chloride 10 mEq in 100 mL IVPB (10 mEq Intravenous New Bag/Given 07/30/21 1809)  sodium chloride 0.9 % bolus 1,000 mL ( Intravenous Stopped 07/28/21 1636)  lactated ringers bolus 1,000 mL (0 mLs Intravenous Stopped 07/28/21 2021)  ceFEPIme (MAXIPIME) 2 g in sodium chloride 0.9 % 100 mL IVPB (0 g Intravenous Stopped 07/28/21 1912)  metroNIDAZOLE (FLAGYL) IVPB 500 mg (0 mg Intravenous Stopped 07/28/21 2021)  vancomycin (VANCOCIN) IVPB 1000 mg/200 mL premix (1,000 mg Intravenous New Bag/Given 07/28/21 2030)  acetaminophen (TYLENOL) tablet 650 mg (650 mg Oral Given 07/28/21 1954)  potassium chloride 10 mEq in 100 mL IVPB (10 mEq Intravenous New Bag/Given 07/29/21 1704)  lactated ringers bolus 250 mL (250 mLs Intravenous New Bag/Given 07/30/21 0250)  potassium chloride 10 MEQ/100ML IVPB (10 mEq  New Bag/Given 07/30/21 2000)    ED Course/ Medical Decision Making/ A&P Clinical Course as of 07/31/21 0656  Sun Jul 28, 2021  1526 CBC with leukocytosis, normal otherwise. CMP with mild hyponatremia/hypochloremia, but otherwise normal.  [CS]  1527 CT head is neg for injury. CXR is clear  [CS]  8101 Covid/Flu neg.  [CS]  7510 Patient now running a  fever. Given her leukocytosis she meets SIRS criteria. Will add blood cultures, lactic acid and IVF. Requested RN do an in-and-out cath for urine specimen.  [CS]  9937 UA without signs of infection. Given fever, leukocytosis and no other signs of infection, consider occult sepsis. Will begin broad spectrum Abx and discuss admission with family.  [CS]  1800 Family is amenable to admission. She has some excoriations on her L leg that have been treated with Keflex orally recently but they are improving per son. Lower back had some similar lesions which have resolved. I have put a photo in the media tab but I don't  think this is the source of her fever and leukocytosis. She does not have any meningeal signs to suggest that is a source either.  [CS]  1858 Spoke with Dr. Sherrye Payor, Hospitalist, who will accept for admission.  [CS]    Clinical Course User Index [CS] Truddie Hidden, MD                           Medical Decision Making Amount and/or Complexity of Data Reviewed Labs: ordered. Radiology: ordered. ECG/medicine tests: ordered.  Risk OTC drugs. Prescription drug management. Decision regarding hospitalization.   Patient is a 88 y.o. female with a cc of 88 yo F with a chief complaints of concern for altered mental status.  Was noted this morning.  Concerned that it may been caused by fall out of the bed.  No noted trauma per the nursing facility.  With her change in mentation she was brought here for evaluation.  On my exam she answers yes and no questions and appears in no acute distress.  No obvious pain on palpation from head to toe.  No signs of bruising.  She does tell me that she has a headache but has trouble answering further questions about it.  We will obtain a CT scan of the head.  Chest x-ray and UA to assess for occult infection.  COVID test.  CBC CMP.  Bag of IV fluids for possible dehydration.  Reassess. Significant PMH of dementia.    I reviewed the patients chart and does seem to be up-to-date on their COVID vaccinations.   Patient care signed out to Dr. Karle Starch, please see their note for further details of care.  Significant leukocytosis.   The patients results and plan were reviewed and discussed.   Any x-rays performed were independently reviewed by myself.   Differential diagnosis were considered with the presenting HPI.  Medications  enoxaparin (LOVENOX) injection 40 mg (40 mg Subcutaneous Given 07/30/21 0957)  ondansetron (ZOFRAN) tablet 4 mg ( Oral See Alternative 07/29/21 1024)    Or  ondansetron (ZOFRAN) injection 4 mg (4 mg Intravenous Given 07/29/21 1024)  MEDLINE  mouth rinse (15 mLs Mouth Rinse Given 07/30/21 2143)  vancomycin (VANCOCIN) capsule 125 mg (125 mg Oral Given 07/30/21 2300)  donepezil (ARICEPT) tablet 10 mg (10 mg Oral Given 07/30/21 2143)  levothyroxine (SYNTHROID) tablet 50 mcg (50 mcg Oral Given 07/31/21 0555)  sertraline (ZOLOFT) tablet 100 mg (100 mg Oral Not Given 07/30/21 1042)  traZODone (DESYREL) tablet 100 mg (100 mg Oral Given 07/30/21 2143)  cholecalciferol (VITAMIN D3) tablet 2,000 Units (2,000 Units Oral Given 07/30/21 0953)  diltiazem (CARDIZEM) 125 mg in dextrose 5% 125 mL (1 mg/mL) infusion (0 mg/hr Intravenous Stopped 07/30/21 1646)  acetaminophen (TYLENOL) tablet 650 mg (has no administration in time range)    Or  acetaminophen (TYLENOL) suppository 650 mg (has no administration in time range)  diltiazem (CARDIZEM) tablet 30 mg (30 mg Oral Given 07/31/21 0555)  potassium chloride 10 mEq in 100 mL IVPB (10 mEq Intravenous New Bag/Given 07/30/21 1809)  sodium chloride 0.9 % bolus 1,000 mL ( Intravenous Stopped 07/28/21 1636)  lactated ringers bolus 1,000 mL (0 mLs Intravenous Stopped 07/28/21 2021)  ceFEPIme (MAXIPIME) 2 g in sodium chloride 0.9 % 100 mL IVPB (0 g Intravenous Stopped 07/28/21 1912)  metroNIDAZOLE (FLAGYL) IVPB 500 mg (0 mg Intravenous Stopped 07/28/21 2021)  vancomycin (VANCOCIN) IVPB 1000 mg/200 mL premix (1,000 mg Intravenous New Bag/Given 07/28/21 2030)  acetaminophen (TYLENOL) tablet 650 mg (650 mg Oral Given 07/28/21 1954)  potassium chloride 10 mEq in 100 mL IVPB (10 mEq Intravenous New Bag/Given 07/29/21 1704)  lactated ringers bolus 250 mL (250 mLs Intravenous New Bag/Given 07/30/21 0250)  potassium chloride 10 MEQ/100ML IVPB (10 mEq  New Bag/Given 07/30/21 2000)    Vitals:   07/30/21 2100 07/30/21 2149 07/30/21 2321 07/31/21 0500  BP: (!) 114/50   (!) 111/47  Pulse: 80   80  Resp: 19   18  Temp: 98.9 F (37.2 C)   98.2 F (36.8 C)  TempSrc: Oral   Oral  SpO2: 96% 94% 94% 96%  Weight:      Height:        Final diagnoses:   Sepsis with encephalopathy without septic shock, due to unspecified organism [facility])           Final Clinical Impression(s) / ED Diagnoses Final diagnoses:  Sepsis with encephalopathy without septic shock, due to unspecified organism [facility])    Rx / DC Orders ED Discharge Orders     None         Deno Etienne, DO 07/31/21 3149

## 2021-07-28 NOTE — ED Triage Notes (Signed)
Son states she fell out of or slid out of bed onto floor, approx 0130hrs. Son states she has short term memory loss, but has noticed a change in her mobility

## 2021-07-28 NOTE — ED Provider Notes (Signed)
Care of the patient assumed at the change of shift. Here from ALF where she slid out of bed last night. Per son was more confused than usual today, brought for evaluation. Pending labs and imaging studies.  Physical Exam  BP (!) 111/54 (BP Location: Right Arm)    Pulse 76    Temp 98.2 F (36.8 C) (Oral)    Resp 18    Ht 5\' 6"  (1.676 m)    Wt 65.8 kg    SpO2 96%    BMI 23.41 kg/m   Physical Exam No meningismus Skin rash on legs as below Resp even and unlabored No distress   Procedures  Procedures  ED Course / MDM   Clinical Course as of 07/28/21 1912  Sun Jul 28, 2021  1526 CBC with leukocytosis, normal otherwise. CMP with mild hyponatremia/hypochloremia, but otherwise normal.  [CS]  1527 CT head is neg for injury. CXR is clear  [CS]  1610 Covid/Flu neg.  [CS]  9604 Patient now running a fever. Given her leukocytosis she meets SIRS criteria. Will add blood cultures, lactic acid and IVF. Requested RN do an in-and-out cath for urine specimen.  [CS]  5409 UA without signs of infection. Given fever, leukocytosis and no other signs of infection, consider occult sepsis. Will begin broad spectrum Abx and discuss admission with family.  [CS]  1800 Family is amenable to admission. She has some excoriations on her L leg that have been treated with Keflex orally recently but they are improving per son. Lower back had some similar lesions which have resolved. I have put a photo in the media tab but I don't think this is the source of her fever and leukocytosis. She does not have any meningeal signs to suggest that is a source either.  [CS]  Clarion Spoke with Dr. Sherrye Payor, Hospitalist, who will accept for admission.  [CS]    Clinical Course User Index [CS] Truddie Hidden, MD   Medical Decision Making Problems Addressed: Sepsis with encephalopathy without septic shock, due to unspecified organism [facility]): acute illness or injury that poses a threat to life or bodily functions  Amount and/or  Complexity of Data Reviewed Labs: ordered. Decision-making details documented in ED Course. Radiology: ordered and independent interpretation performed. Decision-making details documented in ED Course. ECG/medicine tests: ordered and independent interpretation performed. Decision-making details documented in ED Course.  Risk Prescription drug management. Decision regarding hospitalization.          Truddie Hidden, MD 07/28/21 [phone]

## 2021-07-28 NOTE — Progress Notes (Addendum)
Message with bedside RN stating BC and LA were drawn prior to abx started. As of 1845 labs still not showing as collected. Elink will continue to monitor   At 1900 bedside RN messaged back, there was miscommunication, labs actually were not drawn prior to abx started, she is drawing them now.

## 2021-07-28 NOTE — Assessment & Plan Note (Addendum)
Currently on 2 L of oxygen.  Repeat chest x-ray on 07/30/2021 showed improving infiltrates but possible opacity in the left lung.  We will continue incentive spirometry, propped up position, supplemental oxygen.  Wean as able.  We will try to sit up in the chair and ambulate as possible

## 2021-07-28 NOTE — H&P (Signed)
History and Physical    Patient: Mallory Grant [id number]:[id number] DOB: May 14, 1929 DOA: 07/28/2021 DOS: the patient was seen and examined on 07/28/2021 PCP: Shirline Frees, MD  Patient coming from: ALF  Chief Complaint:  Chief Complaint  Patient presents with   Fall    HPI: Mallory Grant is a 88 y.o. female with medical history significant of dementia, PAF.  Pt resides in  ALF, memory care.  Pt presents to ED for increased confusion from baseline.  At baseline: Normally ambulates with a walker and was unable to stand up and walk.  Quite conversant.  Today per son: called her and his conversation with her was not consistent with her baseline.  Answering mostly in yes and no answers.  ALF also unable to ambulate her today.  No noted cough or fever.  No nausea vomiting.  Has developed diarrhea since arrival to unit per RN.  No decreased oral intake.  ROS attempted: pt denies abd pain, CP, headache, meningismus, cough, SOB, back pain, flank pain.  Review of Systems: As mentioned in the history of present illness. All other systems reviewed and are negative. Past Medical History:  Diagnosis Date   Arthritis    Wrists and knees   Atrial fibrillation (HCC)    "lone", paroxysmal manner   Atrial tachycardia ([place])    Baker's cyst    right calf   Constipation    Dementia (HCC)    GERD (gastroesophageal reflux disease)    Headache(784.0)    History of iritis    History of TMJ syndrome    left side   Hypothyroidism    Macular degeneration    Macular degeneration    Past Surgical History:  Procedure Laterality Date   DOPPLER ECHOCARDIOGRAPHY     EXCISION MORTON'S NEUROMA     EYE SURGERY     bil cataracts w IOL   KNEE ARTHROSCOPY WITH LATERAL MENISECTOMY  07/08/2012   Procedure: KNEE ARTHROSCOPY WITH LATERAL MENISECTOMY;  Surgeon: Magnus Sinning, MD;  Location: [place];  Service: Orthopedics;  Laterality: Right;   Partial     KNEE ARTHROSCOPY WITH MEDIAL  MENISECTOMY  07/08/2012   Procedure: KNEE ARTHROSCOPY WITH MEDIAL MENISECTOMY;  Surgeon: Magnus Sinning, MD;  Location: [place];  Service: Orthopedics;  Laterality: Right;  partial   LEFT HEART CATHETERIZATION WITH CORONARY ANGIOGRAM N/A 04/08/2013   Procedure: LEFT HEART CATHETERIZATION WITH CORONARY ANGIOGRAM;  Surgeon: Sanda Klein, MD;  Location: [facility];  Service: Cardiovascular;  Laterality: N/A;   nuclear scintography     TONSILLECTOMY     WRIST SURGERY     Social History:  reports that she has never smoked. She has never used smokeless tobacco. She reports current alcohol use. She reports that she does not use drugs.  Allergies  Allergen Reactions   Diphenhydramine-Zinc Acetate     Other reaction(s): Other (See Comments) Makes hyper   Procaine     Other reaction(s): Other (See Comments) Makes heart race    Benadryl [Diphenhydramine Hcl] Other (See Comments)    hyperactivity   Codeine Nausea And Vomiting and Nausea Only   Novocain [Procaine Hcl] Other (See Comments)    tachycardia   Sulfa Antibiotics Nausea And Vomiting and Nausea Only         Family History  Problem Relation Age of Onset   Diabetes Mother    Alzheimer's disease Father     Prior to Admission medications   Medication Sig Start Date  End Date Taking? Authorizing Provider  amitriptyline (ELAVIL) 25 MG tablet Take 1 tablet by mouth daily. 04/25/14  Yes [provider]  cephALEXin (KEFLEX) 500 MG capsule Take 500 mg by mouth 2 (two) times daily. Started 2/2 for 10 days   Yes [provider]  Cholecalciferol (VITAMIN D PO) Take 25 Units by mouth 2 (two) times daily.   Yes [provider]  donepezil (ARICEPT) 10 MG tablet Take 10 mg by mouth at bedtime.   Yes [provider]  levothyroxine (SYNTHROID, LEVOTHROID) 75 MCG tablet Take 75 mcg by mouth daily.   Yes [provider]  Multiple Vitamins-Minerals (PRESERVISION AREDS 2) CAPS Take 1  tablet by mouth 2 (two) times daily.    Yes [provider]  polyethylene glycol (MIRALAX / GLYCOLAX) packet Take 17 g by mouth daily as needed.    Yes [provider]  sertraline (ZOLOFT) 100 MG tablet Take 100 mg by mouth daily.   Yes [provider]  traZODone (DESYREL) 100 MG tablet Take 100 mg by mouth at bedtime.   Yes [provider]  aspirin 162 MG EC tablet Take 162 mg by mouth daily.    [provider]  fish oil-omega-3 fatty acids 1000 MG capsule Take 1 g by mouth daily.     [provider]  Magnesium 250 MG TABS Take 1 tablet by mouth daily.    [provider]  ranitidine (ZANTAC) 150 MG capsule Take 150 mg by mouth 2 (two) times daily.    [provider]    Physical Exam: Vitals:   07/28/21 2030 07/28/21 2045 07/28/21 2100 07/28/21 2213  BP: (!) 104/52 (!) 107/58 (!) 91/58 (!) 112/54  Pulse:  89 85 83  Resp: (!) 22 (!) 23 (!) 21 (!) 22  Temp:    98.2 F (36.8 C)  TempSrc:    Oral  SpO2:  92% 95% 94%  Weight:      Height:       Constitutional: NAD, calm, comfortable Eyes: PERRL, lids and conjunctivae normal ENMT: Mucous membranes are moist. Posterior pharynx clear of any exudate or lesions.Normal dentition.  Neck: normal, supple, no masses, no thyromegaly Respiratory: clear to auscultation bilaterally, no wheezing, no crackles. Normal respiratory effort. No accessory muscle use. Does have occasional cough. Cardiovascular: Regular rate and rhythm, no murmurs / rubs / gallops. No extremity edema. 2+ pedal pulses. No carotid bruits.  Abdomen: no tenderness, no masses palpated. No hepatosplenomegaly. Bowel sounds positive.  Musculoskeletal: no clubbing / cyanosis. No joint deformity upper and lower extremities. Good ROM, no contractures. Normal muscle tone.  Skin: See below, Don't think this is the source for sepsis im looking for though Neurologic: MAE, answers yes/no questions Psychiatric: Demented,  cooperative, but pleasantly confused   Data Reviewed:  WBC 24k,  Lactate 2.4  Assessment and Plan: * Sepsis with hypotension ([place])- (present on admission) Patient meets criteria for sepsis at time of admission. Specifically the patient has at least 2 out of 4 SIRS criteria, namely: Fever, WBC, tachypnea The currently suspected source of infection is Unknown source Lactic acid: 2.4 Blood pressure: As low as 83/66, now 294 systolic after IVF IVF: 2L bolus and 100cc/hr Antibiotics: cefepime, flagyl, vanc Cultures pending COVID and flu neg UA neg CXR neg No meningismus LFTs nl Has unimpressive rash lesions on leg that were improving as outpt with keflex, doubt cellulitis is source. Since presenting to ED, has developed new onsets of Hypoxia and diarrhea, see below  discussions.  Diarrhea- (present on admission) Has developed diarrhea since arrival to unit per RN with 3 stools already. This is the other possible source of her sepsis though does seem a bit less likely than PNA.  Wouldn't explain hypoxia. No abd pain. Plan: 1) IVF 2) working up hypoxia at the moment. 3) considered C.Diff testing in setting of diarrhea + sepsis + recent ABx use (keflex), especially if repeat CXR neg, unfortunately I cant just order this test as this now requires an ID consult. 4) quality of diarrhea = brown and loose, not really consistent with C.diff as described by RN. 5) will put on imodium PRN for the moment  Acute respiratory failure with hypoxia ([place])- (present on admission) In the past few hours, it seems pt has developed new O2 requirement, satting in 80s on RA. O2 via [place], though keeping this on will be a struggle with her delirium. Cont pulse ox Getting repeat CXR: wondering if we will see a PNA better now that we have her hydrated from a sepsis standpoint, would also explain the currently 'missing source'.  Acute metabolic encephalopathy- (present on admission) Delirium due to sepsis on  top of chronic dementia.  Paroxysmal atrial fibrillation ([place])- (present on admission) Tele monitor Doesn't appear to be on chronic AC Looks like transient A.Fib for one episode in past per prior note in chart.       Advance Care Planning:   Code Status: Full Code   Consults: None  Family Communication: No family in room  Severity of Illness: The appropriate patient status for this patient is OBSERVATION. Observation status is judged to be reasonable and necessary in order to provide the required intensity of service to ensure the patient's safety. The patient's presenting symptoms, physical exam findings, and initial radiographic and laboratory data in the context of their medical condition is felt to place them at decreased risk for further clinical deterioration. Furthermore, it is anticipated that the patient will be medically stable for discharge from the hospital within 2 midnights of admission.   Author: Etta Quill., DO 07/28/2021 10:45 PM  For on call review [email or web address].

## 2021-07-28 NOTE — ED Notes (Signed)
BEFAST/ VAN Negative, has equal grips and good plantar and dorsal flexion.

## 2021-07-28 NOTE — Progress Notes (Signed)
Placed SPO2 dropped 86%.  Placed patient on 3 liter nasal cannula.  Patient's SPO2 increased to 94%.  RT will continue to monitor.

## 2021-07-28 NOTE — Sepsis Progress Note (Signed)
Communication took place with current bedside RN who will gather 2nd lactic acid

## 2021-07-28 NOTE — ED Notes (Signed)
Report given to [facility].  All questions answered.

## 2021-07-28 NOTE — ED Notes (Signed)
Dr. Karle Starch aware of lactic acid 2.4. No new orders.

## 2021-07-28 NOTE — ED Notes (Signed)
Patient transported via [facility] to [place] at this time.  Family present at time of transport.

## 2021-07-28 NOTE — Assessment & Plan Note (Addendum)
On presentation. Likely secondary to C. difficile infection.  On oral vancomycin at this time.  We will continue to complete 10-day course.  COVID and flu neg. Urinalysis was negative.  Chest x-ray was negative for acute findings.  Blood cultures negative so far

## 2021-07-28 NOTE — ED Notes (Signed)
Patient transported to X-ray & CT °

## 2021-07-28 NOTE — Assessment & Plan Note (Addendum)
Loose stools after admission to the hospital.  We will get C. difficile and GI pathogen panel at this time.

## 2021-07-28 NOTE — ED Notes (Signed)
ED Provider at bedside. 

## 2021-07-28 NOTE — Progress Notes (Signed)
[facility] following for code sepsis 

## 2021-07-29 ENCOUNTER — Other Ambulatory Visit (HOSPITAL_COMMUNITY): Payer: Self-pay

## 2021-07-29 DIAGNOSIS — K219 Gastro-esophageal reflux disease without esophagitis: Secondary | ICD-10-CM | POA: Diagnosis present

## 2021-07-29 DIAGNOSIS — Z9181 History of falling: Secondary | ICD-10-CM | POA: Diagnosis not present

## 2021-07-29 DIAGNOSIS — M170 Bilateral primary osteoarthritis of knee: Secondary | ICD-10-CM | POA: Diagnosis present

## 2021-07-29 DIAGNOSIS — R5381 Other malaise: Secondary | ICD-10-CM | POA: Diagnosis not present

## 2021-07-29 DIAGNOSIS — A4189 Other specified sepsis: Secondary | ICD-10-CM | POA: Diagnosis present

## 2021-07-29 DIAGNOSIS — E871 Hypo-osmolality and hyponatremia: Secondary | ICD-10-CM | POA: Diagnosis not present

## 2021-07-29 DIAGNOSIS — R6520 Severe sepsis without septic shock: Secondary | ICD-10-CM | POA: Diagnosis not present

## 2021-07-29 DIAGNOSIS — I959 Hypotension, unspecified: Secondary | ICD-10-CM | POA: Diagnosis not present

## 2021-07-29 DIAGNOSIS — E869 Volume depletion, unspecified: Secondary | ICD-10-CM | POA: Diagnosis not present

## 2021-07-29 DIAGNOSIS — Z882 Allergy status to sulfonamides status: Secondary | ICD-10-CM | POA: Diagnosis not present

## 2021-07-29 DIAGNOSIS — Z885 Allergy status to narcotic agent status: Secondary | ICD-10-CM | POA: Diagnosis not present

## 2021-07-29 DIAGNOSIS — A0472 Enterocolitis due to Clostridium difficile, not specified as recurrent: Secondary | ICD-10-CM | POA: Diagnosis present

## 2021-07-29 DIAGNOSIS — R0902 Hypoxemia: Secondary | ICD-10-CM | POA: Diagnosis not present

## 2021-07-29 DIAGNOSIS — I480 Paroxysmal atrial fibrillation: Secondary | ICD-10-CM | POA: Diagnosis present

## 2021-07-29 DIAGNOSIS — E039 Hypothyroidism, unspecified: Secondary | ICD-10-CM | POA: Diagnosis present

## 2021-07-29 DIAGNOSIS — Z20822 Contact with and (suspected) exposure to covid-19: Secondary | ICD-10-CM | POA: Diagnosis present

## 2021-07-29 DIAGNOSIS — I4891 Unspecified atrial fibrillation: Secondary | ICD-10-CM | POA: Diagnosis not present

## 2021-07-29 DIAGNOSIS — J9601 Acute respiratory failure with hypoxia: Secondary | ICD-10-CM | POA: Diagnosis present

## 2021-07-29 DIAGNOSIS — R0609 Other forms of dyspnea: Secondary | ICD-10-CM | POA: Diagnosis not present

## 2021-07-29 DIAGNOSIS — R262 Difficulty in walking, not elsewhere classified: Secondary | ICD-10-CM | POA: Diagnosis present

## 2021-07-29 DIAGNOSIS — E876 Hypokalemia: Secondary | ICD-10-CM | POA: Diagnosis not present

## 2021-07-29 DIAGNOSIS — H3530 Unspecified macular degeneration: Secondary | ICD-10-CM | POA: Diagnosis present

## 2021-07-29 DIAGNOSIS — F0390 Unspecified dementia without behavioral disturbance: Secondary | ICD-10-CM | POA: Diagnosis present

## 2021-07-29 DIAGNOSIS — G9341 Metabolic encephalopathy: Secondary | ICD-10-CM | POA: Diagnosis present

## 2021-07-29 DIAGNOSIS — Z884 Allergy status to anesthetic agent status: Secondary | ICD-10-CM | POA: Diagnosis not present

## 2021-07-29 DIAGNOSIS — A09 Infectious gastroenteritis and colitis, unspecified: Secondary | ICD-10-CM | POA: Diagnosis present

## 2021-07-29 DIAGNOSIS — L089 Local infection of the skin and subcutaneous tissue, unspecified: Secondary | ICD-10-CM | POA: Diagnosis present

## 2021-07-29 DIAGNOSIS — R197 Diarrhea, unspecified: Secondary | ICD-10-CM | POA: Diagnosis not present

## 2021-07-29 DIAGNOSIS — R21 Rash and other nonspecific skin eruption: Secondary | ICD-10-CM | POA: Diagnosis present

## 2021-07-29 DIAGNOSIS — A419 Sepsis, unspecified organism: Secondary | ICD-10-CM | POA: Diagnosis not present

## 2021-07-29 LAB — COMPREHENSIVE METABOLIC PANEL
ALT: 21 U/L (ref 0–44)
AST: 30 U/L (ref 15–41)
Albumin: 3.3 g/dL — ABNORMAL LOW (ref 3.5–5.0)
Alkaline Phosphatase: 96 U/L (ref 38–126)
Anion gap: 10 (ref 5–15)
BUN: 13 mg/dL (ref 8–23)
CO2: 22 mmol/L (ref 22–32)
Calcium: 8.5 mg/dL — ABNORMAL LOW (ref 8.9–10.3)
Chloride: 99 mmol/L (ref 98–111)
Creatinine, Ser: 0.73 mg/dL (ref 0.44–1.00)
GFR, Estimated: >60 mL/min (ref >60)
Glucose, Bld: 119 mg/dL — ABNORMAL HIGH (ref 70–99)
Potassium: 3.3 mmol/L — ABNORMAL LOW (ref 3.5–5.1)
Sodium: 131 mmol/L — ABNORMAL LOW (ref 135–145)
Total Bilirubin: 1 mg/dL (ref 0.3–1.2)
Total Protein: 6.2 g/dL — ABNORMAL LOW (ref 6.5–8.1)

## 2021-07-29 LAB — LACTIC ACID, PLASMA: Lactic Acid, Venous: 1.3 mmol/L (ref 0.5–1.9)

## 2021-07-29 LAB — C DIFFICILE QUICK SCREEN W PCR REFLEX
C Diff antigen: POSITIVE — AB
C Diff interpretation: DETECTED
C Diff toxin: POSITIVE — AB

## 2021-07-29 LAB — PROTIME-INR
INR: 1 (ref 0.8–1.2)
Prothrombin Time: 13.6 seconds (ref 11.4–15.2)

## 2021-07-29 LAB — CBC
HCT: 37.2 % (ref 36.0–46.0)
Hemoglobin: 12.4 g/dL (ref 12.0–15.0)
MCH: 32.3 pg (ref 26.0–34.0)
MCHC: 33.3 g/dL (ref 30.0–36.0)
MCV: 96.9 fL (ref 80.0–100.0)
Platelets: 201 10*3/uL (ref 150–400)
RBC: 3.84 MIL/uL — ABNORMAL LOW (ref 3.87–5.11)
RDW: 14.2 % (ref 11.5–15.5)
WBC: 23.8 10*3/uL — ABNORMAL HIGH (ref 4.0–10.5)
nRBC: 0 % (ref 0.0–0.2)

## 2021-07-29 LAB — PROCALCITONIN: Procalcitonin: 1.38 ng/mL

## 2021-07-29 LAB — CORTISOL-AM, BLOOD: Cortisol - AM: 36.9 ug/dL — ABNORMAL HIGH (ref 6.7–22.6)

## 2021-07-29 LAB — MRSA NEXT GEN BY PCR, NASAL: MRSA by PCR Next Gen: NOT DETECTED

## 2021-07-29 MED ORDER — LEVOTHYROXINE SODIUM 50 MCG PO TABS
50.0000 ug | ORAL_TABLET | Freq: Every day | ORAL | Status: DC
  Administered 2021-07-29 – 2021-08-02 (×5): 50 ug via ORAL
  Filled 2021-07-29 (×5): qty 1

## 2021-07-29 MED ORDER — DONEPEZIL HCL 10 MG PO TABS
10.0000 mg | ORAL_TABLET | Freq: Every day | ORAL | Status: DC
  Administered 2021-07-29 – 2021-08-01 (×4): 10 mg via ORAL
  Filled 2021-07-29 (×4): qty 1

## 2021-07-29 MED ORDER — VANCOMYCIN HCL 125 MG PO CAPS
125.0000 mg | ORAL_CAPSULE | Freq: Four times a day (QID) | ORAL | Status: DC
  Administered 2021-07-29 – 2021-08-02 (×16): 125 mg via ORAL
  Filled 2021-07-29 (×18): qty 1

## 2021-07-29 MED ORDER — TRAZODONE HCL 100 MG PO TABS
100.0000 mg | ORAL_TABLET | Freq: Every day | ORAL | Status: DC
  Administered 2021-07-29 – 2021-08-01 (×4): 100 mg via ORAL
  Filled 2021-07-29 (×4): qty 1

## 2021-07-29 MED ORDER — POTASSIUM CHLORIDE 10 MEQ/100ML IV SOLN
10.0000 meq | INTRAVENOUS | Status: AC
  Administered 2021-07-29 (×4): 10 meq via INTRAVENOUS
  Filled 2021-07-29 (×4): qty 100

## 2021-07-29 MED ORDER — SERTRALINE HCL 100 MG PO TABS
100.0000 mg | ORAL_TABLET | Freq: Every day | ORAL | Status: DC
  Administered 2021-07-29 – 2021-08-02 (×4): 100 mg via ORAL
  Filled 2021-07-29 (×4): qty 1

## 2021-07-29 MED ORDER — VITAMIN D 25 MCG (1000 UNIT) PO TABS
2000.0000 [IU] | ORAL_TABLET | Freq: Every day | ORAL | Status: DC
  Administered 2021-07-29 – 2021-08-02 (×5): 2000 [IU] via ORAL
  Filled 2021-07-29 (×5): qty 2

## 2021-07-29 NOTE — Progress Notes (Signed)
°   07/29/21 3744  Provider Notification  Provider Name/Title Flora Lipps, MD  Date Provider Notified 07/29/21  Time Provider Notified 1000  Notification Type Page (Secure chat)  Notification Reason Critical result  Test performed and critical result GI panel, positive C-diff  Date Critical Result Received 07/29/21  Time Critical Result Received 0951  Provider response See new orders  Date of Provider Response 07/29/21  Time of Provider Response 1015   Layla Maw, RN

## 2021-07-29 NOTE — TOC Initial Note (Signed)
Transition of Care [facility]) - Initial/Assessment Note    Patient Details  Name: Mallory Grant MRN: [id number] Date of Birth: 10/21/1928  Transition of Care [facility]) CM/SW Contact:    Leeroy Cha, RN Phone Number: 07/29/2021, 10:21 AM  Clinical Narrative:                  Transition of Care [facility]) Screening Note   Patient Details  Name: Mallory Grant Date of Birth: Jan 02, 1929   Transition of Care [facility]) CM/SW Contact:    Leeroy Cha, RN Phone Number: 07/29/2021, 10:21 AM    Transition of Care Department [facility]) has reviewed patient and no TOC needs have been identified at this time. We will continue to monitor patient advancement through interdisciplinary progression rounds. If new patient transition needs arise, please place a TOC consult.    Expected Discharge Plan: Assisted Living Barriers to Discharge: Continued Medical Work up   Patient Goals and CMS Choice Patient states their goals for this hospitalization and ongoing recovery are:: to go home CMS Medicare.gov Compare Post Acute Care list provided to:: Patient Choice offered to / list presented to : Patient  Expected Discharge Plan and Services Expected Discharge Plan: Assisted Living   Discharge Planning Services: CM Consult   Living arrangements for the past 2 months: [place]                                      Prior Living Arrangements/Services Living arrangements for the past 2 months: [place] Lives with:: Self Patient language and need for interpreter reviewed:: Yes Do you feel safe going back to the place where you live?: Yes            Criminal Activity/Legal Involvement Pertinent to Current Situation/Hospitalization: No - Comment as needed  Activities of Daily Living Home Assistive Devices/Equipment: Environmental consultant (specify type), Shower chair without back ADL Screening (condition at time of admission) Patient's cognitive ability adequate to safely  complete daily activities?: No Is the patient deaf or have difficulty hearing?: No Does the patient have difficulty seeing, even when wearing glasses/contacts?: No Does the patient have difficulty concentrating, remembering, or making decisions?: Yes Patient able to express need for assistance with ADLs?: No Does the patient have difficulty dressing or bathing?: Yes Independently performs ADLs?: No Communication: Needs assistance Is this a change from baseline?: Pre-admission baseline Dressing (OT): Needs assistance Is this a change from baseline?: Pre-admission baseline Grooming: Needs assistance Is this a change from baseline?: Pre-admission baseline Feeding: Needs assistance Is this a change from baseline?: Pre-admission baseline Bathing: Needs assistance Is this a change from baseline?: Pre-admission baseline Toileting: Needs assistance Is this a change from baseline?: Pre-admission baseline In/Out Bed: Independent with device (comment) Is this a change from baseline?: Pre-admission baseline Walks in Home: Independent with device (comment) Does the patient have difficulty walking or climbing stairs?: Yes Weakness of Legs: Both Weakness of Arms/Hands: None  Permission Sought/Granted                  Emotional Assessment Appearance:: Appears stated age     Orientation: : Oriented to Self, Oriented to Place, Oriented to  Time, Oriented to Situation Alcohol / Substance Use: Not Applicable Psych Involvement: No (comment)  Admission diagnosis:  Altered mental status [R41.82] Sepsis with encephalopathy without septic shock, due to unspecified organism ([place]) [A41.9, R65.20, G93.40] Patient Active Problem List  Diagnosis Date Noted   Acute metabolic encephalopathy 81/27/5170   Sepsis with hypotension ([place]) 07/28/2021   Acute respiratory failure with hypoxia ([place]) 07/28/2021   Diarrhea 07/28/2021   Abnormal nuclear stress test 04/08/2013   Hyperlipidemia 04/06/2013    Chest pain with abnormal stress test 03/16/2013   Paroxysmal atrial fibrillation ([place]) 03/16/2013   Hypothyroidism 03/16/2013   PCP:  Shirline Frees, MD Pharmacy:   [facility], [place] - [address] [place] [address] Phone: [phone] Fax: [phone]     Social Determinants of Health (SDOH) Interventions    Readmission Risk Interventions No flowsheet data found.

## 2021-07-29 NOTE — TOC Benefit Eligibility Note (Signed)
Patient Teacher, English as a foreign language completed.    The patient is currently admitted and upon discharge could be taking vancomycin 125 mg capsules.  The current 30 day co-pay is, $100.00.   The patient is insured through [place], [place] Patient Advocate Specialist [place] Patient Advocate Team Direct Number: [phone]  Fax: [phone]

## 2021-07-29 NOTE — Hospital Course (Addendum)
Patient is a 88 years old female with history of dementia, paroxysmal atrial fibrillation from memory care unit presented to hospital with fall, difficulty ambulating and altered mental status.  In the ED, patient was febrile with a leukocytosis and had some tachypnea.  Lactic acid was elevated and blood pressure slightly low.  Patient was then admitted to the hospital for possible sepsis with hypotension.  Stool analysis however showed C. difficile infection.  Antibiotics were initially started which was subsequently discontinued.

## 2021-07-29 NOTE — Progress Notes (Addendum)
Progress Note   Patient: Mallory Grant [id number]:[id number] DOB: [birth year] DOA: 07/28/2021     0 DOS: the patient was seen and examined on 07/29/2021   Brief hospital course: Is a 88 years old female with history of dementia, paroxysmal atrial fibrillation from memory care unit presented to hospital with fall, difficulty ambulating and altered mental status.  In the ED patient was febrile with a leukocytosis and had some tachypnea.  Lactic acid was elevated and blood pressure slightly low.  Patient was then admitted hospital for possible sepsis with hypotension.  Assessment and Plan: * Sepsis with hypotension ([place])- (present on admission) Patient met criteria for sepsis on presentation with hypotension tachycardia fever tachypnea and elevated lactate.  Received IV fluids.  Currently on cefepime vancomycin and Flagyl. COVID and flu neg. Urinalysis was negative.  Chest x-ray was negative for acute findings.  Blood cultures are pending at this time she did have some hypoxia and diarrhea in the ED. stool for C. difficile was positive for C. difficile infection.  C. difficile diarrhea- (present on admission) Stool test was positive yesterday.  We will discontinue broad-spectrum antibiotic.  Continue on oral vancomycin alone at this time.  Follow cultures.  Acute respiratory failure with hypoxia ([place])- (present on admission) Currently on 3 L of oxygen.  Repeat chest x-ray showed diffuse interstitial thickening suggestive of pulmonary edema versus infiltrate.  continue on current antibiotic for now   Acute metabolic encephalopathy- (present on admission) Patient does have a history of chronic dementia.  Likely encephalopathy secondary to infection.  Paroxysmal atrial fibrillation ([place])- (present on admission) We will continue to monitor.  Currently rate controlled.  Not on anticoagulation as outpatient.   Subjective: Today, patient was seen and examined.  Patient's son at bedside.  Patient denies any  shortness of breath chest pain nausea vomiting or fever.  Had a loose bowel movement today.  Physical Exam: Vitals:   07/28/21 2213 07/29/21 0212 07/29/21 0553 07/29/21 1045  BP: (!) 112/54 99/71 (!) 123/52   Pulse: 83 92 88   Resp: (!) _0 Temp: 98.2 F (36.8 C) 99 F (37.2 C) 100 F (37.8 C) 98.8 F (37.1 C)  TempSrc: Oral Oral Oral Oral  SpO2: 94% 90% 90% 92%  Weight:      Height:       General:  Average built, not in obvious distress, on nasal cannula oxygen HENT:   No scleral pallor or icterus noted. Oral mucosa is moist.  Chest:    Diminished breath sounds bilaterally. No crackles or wheezes.  CVS: S1 &S2 heard. No murmur.  Regular rate and rhythm. Abdomen: Soft, nonspecific tenderness on palpation nondistended.  Bowel sounds are heard.   Extremities: No cyanosis, clubbing or edema.  Peripheral pulses are palpable.  Left lower extremity with few open sores without any obvious induration or erythema. Psych: Alert, awake and oriented, normal mood CNS:  No cranial nerve deficits.  Power equal in all extremities.   Skin: Warm and dry.  No rashes noted.   Data Reviewed: CBC Latest Ref Rng & Units 07/29/2021 07/28/2021 04/04/2013  WBC 4.0 - 10.5 K/uL 23.8(H) 24.4(H) 7.1  Hemoglobin 12.0 - 15.0 g/dL 12.4 12.7 13.3  Hematocrit 36.0 - 46.0 % 37.2 37.9 39.6  Platelets 150 - 400 K/uL 201 239 255    BMP Latest Ref Rng & Units 07/29/2021 07/28/2021 04/04/2013  Glucose 70 - 99 mg/dL 119(H) 117(H) 89  BUN 8 - 23 mg/dL 13 13 16  Creatinine 0.44 - 1.00 mg/dL 0.73 0.91 0.83  Sodium 135 - 145 mmol/L 131(L) 132(L) 134(L)  Potassium 3.5 - 5.1 mmol/L 3.3(L) 3.5 4.5  Chloride 98 - 111 mmol/L 99 97(L) 98  CO2 22 - 32 mmol/L _0 Calcium 8.9 - 10.3 mg/dL 8.5(L) 8.9 9.8     Family Communication: Spoke with the patient's son at bedside.  Disposition: Status is: Observation  The patient will require care spanning > 2 midnights and should be moved to inpatient because: Sepsis, IV  antibiotic     Planned Discharge Destination:  Assisted living facility.   Author: Flora Lipps, MD 07/29/2021 1:53 PM  For on call review [email or web address].

## 2021-07-29 NOTE — Assessment & Plan Note (Addendum)
Continue oral vancomycin.  GI pathogen panel negative, fever has improved. Leukocytosis has normalized.  Has clinically significantly improved.

## 2021-07-29 NOTE — Progress Notes (Addendum)
Repeat CXR showing pulm edema vs infiltrate: Will DC IVF for the moment. Pt not in respiratory distress and satting well on O2. Going to hold off on any diuresis for the moment given underlying sepsis.

## 2021-07-30 ENCOUNTER — Inpatient Hospital Stay (HOSPITAL_COMMUNITY): Payer: Medicare Other

## 2021-07-30 DIAGNOSIS — E871 Hypo-osmolality and hyponatremia: Secondary | ICD-10-CM

## 2021-07-30 DIAGNOSIS — R0609 Other forms of dyspnea: Secondary | ICD-10-CM

## 2021-07-30 DIAGNOSIS — E876 Hypokalemia: Secondary | ICD-10-CM

## 2021-07-30 LAB — ECHOCARDIOGRAM COMPLETE
AR max vel: 2.18 cm2
AV Peak grad: 6.3 mmHg
Ao pk vel: 1.25 m/s
Area-P 1/2: 3.46 cm2
Height: 66 in
MV M vel: 4.48 m/s
MV Peak grad: 80.3 mmHg
S' Lateral: 2.2 cm
Weight: 2321 oz

## 2021-07-30 LAB — COMPREHENSIVE METABOLIC PANEL
ALT: 20 U/L (ref 0–44)
AST: 31 U/L (ref 15–41)
Albumin: 2.9 g/dL — ABNORMAL LOW (ref 3.5–5.0)
Alkaline Phosphatase: 87 U/L (ref 38–126)
Anion gap: 7 (ref 5–15)
BUN: 13 mg/dL (ref 8–23)
CO2: 25 mmol/L (ref 22–32)
Calcium: 8.5 mg/dL — ABNORMAL LOW (ref 8.9–10.3)
Chloride: 98 mmol/L (ref 98–111)
Creatinine, Ser: 0.78 mg/dL (ref 0.44–1.00)
GFR, Estimated: >60 mL/min (ref >60)
Glucose, Bld: 118 mg/dL — ABNORMAL HIGH (ref 70–99)
Potassium: 3.6 mmol/L (ref 3.5–5.1)
Sodium: 130 mmol/L — ABNORMAL LOW (ref 135–145)
Total Bilirubin: 0.9 mg/dL (ref 0.3–1.2)
Total Protein: 5.8 g/dL — ABNORMAL LOW (ref 6.5–8.1)

## 2021-07-30 LAB — MAGNESIUM: Magnesium: 1.8 mg/dL (ref 1.7–2.4)

## 2021-07-30 LAB — GASTROINTESTINAL PANEL BY PCR, STOOL (REPLACES STOOL CULTURE)

## 2021-07-30 LAB — CBC
HCT: 38.2 % (ref 36.0–46.0)
Hemoglobin: 12.6 g/dL (ref 12.0–15.0)
MCH: 31.6 pg (ref 26.0–34.0)
MCHC: 33 g/dL (ref 30.0–36.0)
MCV: 95.7 fL (ref 80.0–100.0)
Platelets: 187 10*3/uL (ref 150–400)
RBC: 3.99 MIL/uL (ref 3.87–5.11)
RDW: 14.3 % (ref 11.5–15.5)
WBC: 19.8 10*3/uL — ABNORMAL HIGH (ref 4.0–10.5)
nRBC: 0 % (ref 0.0–0.2)

## 2021-07-30 LAB — TSH: TSH: 1.571 u[IU]/mL (ref 0.350–4.500)

## 2021-07-30 MED ORDER — ACETAMINOPHEN 325 MG PO TABS: 650.0000 mg | ORAL_TABLET | Freq: Four times a day (QID) | ORAL | Status: DC | PRN

## 2021-07-30 MED ORDER — DILTIAZEM HCL-DEXTROSE 125-5 MG/125ML-% IV SOLN (PREMIX)
5.0000 mg/h | INTRAVENOUS | Status: AC
  Administered 2021-07-30: 5 mg/h via INTRAVENOUS
  Administered 2021-07-30: 10 mg/h via INTRAVENOUS
  Filled 2021-07-30 (×2): qty 125

## 2021-07-30 MED ORDER — DILTIAZEM HCL 30 MG PO TABS
30.0000 mg | ORAL_TABLET | Freq: Four times a day (QID) | ORAL | Status: DC
  Administered 2021-07-30 – 2021-07-31 (×4): 30 mg via ORAL
  Filled 2021-07-30 (×4): qty 1

## 2021-07-30 MED ORDER — POTASSIUM CHLORIDE 10 MEQ/100ML IV SOLN
10.0000 meq | INTRAVENOUS | Status: AC
  Administered 2021-07-30 (×3): 10 meq via INTRAVENOUS
  Filled 2021-07-30 (×3): qty 100

## 2021-07-30 MED ORDER — POTASSIUM CHLORIDE 10 MEQ/100ML IV SOLN
INTRAVENOUS | Status: AC
  Administered 2021-07-30: 10 meq
  Filled 2021-07-30: qty 100

## 2021-07-30 MED ORDER — ACETAMINOPHEN 650 MG RE SUPP: 650.0000 mg | Freq: Four times a day (QID) | RECTAL | Status: DC | PRN

## 2021-07-30 MED ORDER — LACTATED RINGERS IV BOLUS
250.0000 mL | Freq: Once | INTRAVENOUS | Status: AC
Start: 2021-07-30 — End: 2021-07-30
  Administered 2021-07-30: 250 mL via INTRAVENOUS

## 2021-07-30 NOTE — Progress Notes (Signed)
Echocardiogram 2D Echocardiogram has been performed.  Mallory Grant 07/30/2021, 3:46 PM

## 2021-07-30 NOTE — Progress Notes (Deleted)
°   07/30/21 0934 07/30/21 1700  Vitals  Temp 99.9 F (37.7 C)  --   Temp Source Oral  --   BP (!) 116/59  --   MAP (mmHg) 73  --   BP Location Right Arm  --   BP Method Automatic  --   Patient Position (if appropriate) Lying  --   Pulse Rate (!) 110  --   Pulse Rate Source Monitor  --   Resp 20 20  MEWS COLOR  MEWS Score Color Green Green  Oxygen Therapy  SpO2 (!) 88 %  --   O2 Device HFNC  --   O2 Flow Rate (L/min) 7 L/min  --   MEWS Score  MEWS Temp 0 0  MEWS Systolic 0 0  MEWS Pulse 1 0  MEWS RR 0 0  MEWS LOC 0 0  MEWS Score 1 0   Patient O2 was increased to 10 L, as parameters are to maintain O2 above 92%. Continuing to monitor patient on cardizem drip.  Layla Maw, RN

## 2021-07-30 NOTE — Progress Notes (Signed)
°   07/30/21 0934  Vitals  Temp 99.9 F (37.7 C)  Temp Source Oral  BP (!) 116/59  MAP (mmHg) 73  BP Location Right Arm  BP Method Automatic  Patient Position (if appropriate) Lying  Pulse Rate (!) 110  Pulse Rate Source Monitor  Resp 20  MEWS COLOR  MEWS Score Color Green  Oxygen Therapy  SpO2 (!) 88 %  O2 Device HFNC  O2 Flow Rate (L/min) 7 L/min  MEWS Score  MEWS Temp 0  MEWS Systolic 0  MEWS Pulse 1  MEWS RR 0  MEWS LOC 0  MEWS Score 1   Patient O2 was increased to 10 L, as parameters are to maintain O2 above 92%. Continuing to monitor patient on cardizem drip.  Layla Maw, RN

## 2021-07-30 NOTE — Assessment & Plan Note (Addendum)
Improved after replacement.

## 2021-07-30 NOTE — Progress Notes (Addendum)
°   07/30/21 1700  Vitals  BP 105/90  MAP (mmHg) 97  Pulse Rate 84  ECG Heart Rate 84  Resp 20  MEWS COLOR  MEWS Score Color Green  Oxygen Therapy  SpO2 93 %  O2 Device HFNC  O2 Flow Rate (L/min) 4 L/min  MEWS Score  MEWS Temp 0  MEWS Systolic 0  MEWS Pulse 0  MEWS RR 0  MEWS LOC 0  MEWS Score 0   At 16:46, Cardizem drip has been stopped and patient has been switched to oral Cardizem. Patient is currently in Normal Sinus Rhythm, sustaining in the 80s (HR). Will continue to monitor.  Layla Maw, RN

## 2021-07-30 NOTE — Progress Notes (Signed)
°   07/30/21 1200  Vitals  BP (!) 85/53  MAP (mmHg) (!) 64  Pulse Rate 79  ECG Heart Rate 88  Resp 19  MEWS COLOR  MEWS Score Color Green  Oxygen Therapy  SpO2 94 %  O2 Device HFNC  O2 Flow Rate (L/min) 4 L/min  MEWS Score  MEWS Temp 0  MEWS Systolic 1  MEWS Pulse 0  MEWS RR 0  MEWS LOC 0  MEWS Score 1   Patient weaned down to 4 L HFNC, and patient is currently sating 94%. Will continue to monitor.  Layla Maw, RN

## 2021-07-30 NOTE — Assessment & Plan Note (Signed)
Continue Synthroid °

## 2021-07-30 NOTE — Progress Notes (Addendum)
PROGRESS NOTE    Mallory Grant  JJH:[id number] DOB: 1928-10-29 DOA: 07/28/2021 PCP: Shirline Frees, MD    Brief Narrative:  Patient is a 88 years old female with history of dementia, paroxysmal atrial fibrillation from memory care unit presented to hospital with fall, difficulty ambulating and altered mental status.  In the ED, patient was febrile with a leukocytosis and had some tachypnea.  Lactic acid was elevated and blood pressure slightly low.  Patient was then admitted to the hospital for possible sepsis with hypotension.  Stool analysis however showed C. difficile infection.  Antibiotics were initially started which was subsequently discontinued.  During hospitalization, patient had atrial fibrillation with RVR and was put on Cardizem drip.  She did have increased requirement with oxygen but chest x-ray showed improved  infiltrates.     Assessment and Plan: * Severe sepsis ([place])- (present on admission) Patient met criteria for sepsis on presentation with hypotension tachycardia fever tachypnea and elevated lactate.  Received IV fluids.  Initially patient received cefepime, vancomycin and Flagyl. COVID and flu neg. Urinalysis was negative.  Chest x-ray was negative for acute findings.  Blood cultures negative in 1 day.  stool for C. difficile was positive for C. difficile infection.  We will continue with oral vancomycin.  Discontinued antibiotic since yesterday.  C. difficile diarrhea- (present on admission)  Continue on oral vancomycin alone at this time.  Still has some loose stools.  We will continue to monitor.  GI pathogen panel was negative.  Significant leukocytosis noted.  Max of 100.6 F.  Acute respiratory failure with hypoxia ([place])- (present on admission) Currently on 8 L of oxygen.  Repeat chest x-ray today shows improving infiltrates but possible opacity in the left lung.  We will continue incentive spirometry,, proper position, supplemental oxygen.    Paroxysmal atrial  fibrillation ([place])- (present on admission) With rapid ventricular response.  Patient required Cardizem drip overnight.  We will continue for now.  Not on anticoagulation as outpatient.  Patient is not on rate control medications at home.  We will start on Cardizem p.o. every 6 wean Cardizem drip.  We will check 2D echocardiogram.  Check TSH.  Hypokalemia Replenished and improved.  We will continue replacement for now.  Hyponatremia Mild.  Secondary to diarrhea.  We will continue to monitor.  Replace fluids as necessary.  Acute metabolic encephalopathy- (present on admission) Patient does have a history of chronic dementia.  Likely encephalopathy secondary to infection.  Stable at this time.  Hypothyroidism- (present on admission) Continue Synthroid.  Diarrhea-resolved as of 07/29/2021, (present on admission) Loose stools after admission to the hospital.  We will get C. difficile and GI pathogen panel at this time.     DVT prophylaxis: enoxaparin (LOVENOX) injection 40 mg Start: 07/29/21 1000   Code Status:     Code Status: Full Code  Disposition:  Status is: Inpatient Remains inpatient appropriate because: C. difficile colitis, hypoxic respiratory failure, atrial fibrillation with RVR    Family Communication:  Spoke with the patient's son at bedside today  Consultants:  None  Procedures:  None  Antimicrobials:  Oral vancomycin 07/29/21>  Anti-infectives (From admission, onward)    Start     Dose/Rate Route Frequency Ordered Stop   07/30/21 1800  vancomycin (VANCOREADY) IVPB 1750 mg/350 mL  Status:  Discontinued        1,750 mg 175 mL/hr over 120 Minutes Intravenous Every 48 hours 07/28/21 1907 07/29/21 1243   07/29/21 1200  vancomycin (VANCOCIN) capsule 125  mg        125 mg Oral 4 times daily 07/29/21 1016 08/08/21 1359   07/29/21 0600  ceFEPIme (MAXIPIME) 2 g in sodium chloride 0.9 % 100 mL IVPB  Status:  Discontinued        2 g 200 mL/hr over 30 Minutes Intravenous  Every 12 hours 07/28/21 1907 07/29/21 1243   07/29/21 0600  metroNIDAZOLE (FLAGYL) IVPB 500 mg  Status:  Discontinued        500 mg 100 mL/hr over 60 Minutes Intravenous Every 12 hours 07/28/21 2213 07/29/21 1243   07/28/21 1800  ceFEPIme (MAXIPIME) 2 g in sodium chloride 0.9 % 100 mL IVPB        2 g 200 mL/hr over 30 Minutes Intravenous  Once 07/28/21 1751 07/28/21 1912   07/28/21 1800  metroNIDAZOLE (FLAGYL) IVPB 500 mg        500 mg 100 mL/hr over 60 Minutes Intravenous  Once 07/28/21 1751 07/28/21 2021   07/28/21 1800  vancomycin (VANCOCIN) IVPB 1000 mg/200 mL premix        1,000 mg 200 mL/hr over 60 Minutes Intravenous  Once 07/28/21 1751 07/28/21 2130      Subjective: Today, patient was seen and examined at bedside.  Patient did have few episodes of bowel movements.  Nursing staff reported increased oxygen demand but patient denies any dyspnea or shortness of breath cough or fever.  Patient is a poor historian.  Objective: Vitals:   07/30/21 0934 07/30/21 0952 07/30/21 1010 07/30/21 1305  BP: (!) 116/59   (!) 94/48  Pulse: (!) 110     Resp: 20     Temp: 99.9 F (37.7 C)     TempSrc: Oral     SpO2: (!) 88% 95% 99%   Weight:      Height:        Intake/Output Summary (Last 24 hours) at 07/30/2021 1314 Last data filed at 07/30/2021 1000 Gross per 24 hour  Intake 699.72 ml  Output --  Net 699.72 ml   Filed Weights   07/28/21 1440  Weight: 65.8 kg   Physical Examination: General:  Average built, not in obvious distress, on nasal cannula oxygen HENT:   No scleral pallor or icterus noted. Oral mucosa is moist.  Chest:  .  Diminished breath sounds bilaterally. No crackles or wheezes.  CVS: S1 &S2 heard. No murmur.  Irregularly irregular rhythm with tachycardia Abdomen: Soft, nonspecific tenderness noted, nondistended.  Bowel sounds are heard.   Extremities: No cyanosis, clubbing or edema.  Peripheral pulses are palpable. Psych: Alert, awake and communicative, normal  mood CNS:  No cranial nerve deficits.  Power equal in all extremities.   Skin: Warm and dry.  No rashes noted.  Data Reviewed:   CBC: Recent Labs  Lab 07/28/21 1450 07/29/21 0018 07/30/21 0426  WBC 24.4* 23.8* 19.8*  NEUTROABS 21.6*  --   --   HGB 12.7 12.4 12.6  HCT 37.9 37.2 38.2  MCV 94.5 96.9 95.7  PLT 239 201 580    Basic Metabolic Panel: Recent Labs  Lab 07/28/21 1450 07/29/21 0018 07/30/21 0426  NA 132* 131* 130*  K 3.5 3.3* 3.6  CL 97* 99 98  CO2 $Re'27 22 25  'lbw$ GLUCOSE 117* 119* 118*  BUN $Re'13 13 13  'cKw$ CREATININE 0.91 0.73 0.78  CALCIUM 8.9 8.5* 8.5*  MG  --   --  1.8    GFR: Estimated Creatinine Clearance: 43.8 mL/min (by C-G formula based on SCr of  0.78 mg/dL).  Liver Function Tests: Recent Labs  Lab 07/28/21 1450 07/29/21 0018 07/30/21 0426  AST $Re'31 30 31  'PMP$ ALT $R'22 21 20  'ui$ ALKPHOS 117 96 87  BILITOT 0.9 1.0 0.9  PROT 7.0 6.2* 5.8*  ALBUMIN 3.7 3.3* 2.9*    CBG: No results for input(s): GLUCAP in the last 168 hours.   Recent Results (from the past 240 hour(s))  Resp Panel by RT-PCR (Flu A&B, Covid) Nasopharyngeal Swab     Status: None   Collection Time: 07/28/21  3:31 PM   Specimen: Nasopharyngeal Swab; Nasopharyngeal(NP) swabs in vial transport medium  Result Value Ref Range Status   SARS Coronavirus 2 by RT PCR NEGATIVE NEGATIVE Final    Comment: (NOTE) SARS-CoV-2 target nucleic acids are NOT DETECTED.  The SARS-CoV-2 RNA is generally detectable in upper respiratory specimens during the acute phase of infection. The lowest concentration of SARS-CoV-2 viral copies this assay can detect is 138 copies/mL. A negative result does not preclude SARS-Cov-2 infection and should not be used as the sole basis for treatment or other patient management decisions. A negative result may occur with  improper specimen collection/handling, submission of specimen other than nasopharyngeal swab, presence of viral mutation(s) within the areas targeted by this  assay, and inadequate number of viral copies(<138 copies/mL). A negative result must be combined with clinical observations, patient history, and epidemiological information. The expected result is Negative.  Fact Sheet for Patients:  EntrepreneurPulse.com.au  Fact Sheet for Healthcare Providers:  IncredibleEmployment.be  This test is no t yet approved or cleared by the [place] FDA and  has been authorized for detection and/or diagnosis of SARS-CoV-2 by FDA under an Emergency Use Authorization (EUA). This EUA will remain  in effect (meaning this test can be used) for the duration of the COVID-19 declaration under Section 564(b)(1) of the Act, 21 U.S.C.section 360bbb-3(b)(1), unless the authorization is terminated  or revoked sooner.       Influenza A by PCR NEGATIVE NEGATIVE Final   Influenza B by PCR NEGATIVE NEGATIVE Final    Comment: (NOTE) The Xpert Xpress SARS-CoV-2/FLU/RSV plus assay is intended as an aid in the diagnosis of influenza from Nasopharyngeal swab specimens and should not be used as a sole basis for treatment. Nasal washings and aspirates are unacceptable for Xpert Xpress SARS-CoV-2/FLU/RSV testing.  Fact Sheet for Patients: EntrepreneurPulse.com.au  Fact Sheet for Healthcare Providers: IncredibleEmployment.be  This test is not yet approved or cleared by the [place] FDA and has been authorized for detection and/or diagnosis of SARS-CoV-2 by FDA under an Emergency Use Authorization (EUA). This EUA will remain in effect (meaning this test can be used) for the duration of the COVID-19 declaration under Section 564(b)(1) of the Act, 21 U.S.C. section 360bbb-3(b)(1), unless the authorization is terminated or revoked.  Performed at [facility], [address] [place], [place] [address]   Culture, blood (routine x 2)     Status: None (Preliminary result)    Collection Time: 07/28/21  7:00 PM   Specimen: BLOOD LEFT FOREARM  Result Value Ref Range Status   Specimen Description   Final    BLOOD LEFT FOREARM Performed at [facility], [place] [address] [place], [place] [address]    Special Requests   Final    Blood Culture adequate volume BOTTLES DRAWN AEROBIC AND ANAEROBIC Performed at [facility], [address] [place], [place] [address]    Culture   Final  NO GROWTH 2 DAYS Performed at [facility], [place] [address] [place], [place] [address]    Report Status PENDING  Incomplete  Culture, blood (routine x 2)     Status: None (Preliminary result)   Collection Time: 07/29/21 12:18 AM   Specimen: BLOOD  Result Value Ref Range Status   Specimen Description   Final    BLOOD SPECIMEN SOURCE NOT MARKED ON REQUISITION Performed at [place] [address], [place], [place] [address]    Special Requests   Final    BOTTLES DRAWN AEROBIC ONLY Blood Culture adequate volume Performed at [place] [address], [place], [place] [address]    Culture   Final    NO GROWTH 1 DAY Performed at [facility], [place] [address], [place], [place] [address]    Report Status PENDING  Incomplete  Gastrointestinal Panel by PCR , Stool     Status: None   Collection Time: 07/29/21  8:27 AM   Specimen: Stool  Result Value Ref Range Status   Campylobacter species NOT DETECTED NOT DETECTED Final   Plesimonas shigelloides NOT DETECTED NOT DETECTED Final   Salmonella species NOT DETECTED NOT DETECTED Final   Yersinia enterocolitica NOT DETECTED NOT DETECTED Final   Vibrio species NOT DETECTED NOT DETECTED Final   Vibrio cholerae NOT DETECTED NOT DETECTED Final   Enteroaggregative E coli (EAEC) NOT DETECTED NOT DETECTED Final   Enteropathogenic E coli (EPEC) NOT DETECTED NOT DETECTED Final   Enterotoxigenic E coli (ETEC) NOT DETECTED NOT DETECTED Final   Shiga like toxin producing E  coli (STEC) NOT DETECTED NOT DETECTED Final   Shigella/Enteroinvasive E coli (EIEC) NOT DETECTED NOT DETECTED Final   Cryptosporidium NOT DETECTED NOT DETECTED Final   Cyclospora cayetanensis NOT DETECTED NOT DETECTED Final   Entamoeba histolytica NOT DETECTED NOT DETECTED Final   Giardia lamblia NOT DETECTED NOT DETECTED Final   Adenovirus F40/41 NOT DETECTED NOT DETECTED Final   Astrovirus NOT DETECTED NOT DETECTED Final   Norovirus GI/GII NOT DETECTED NOT DETECTED Final   Rotavirus A NOT DETECTED NOT DETECTED Final   Sapovirus (I, II, IV, and V) NOT DETECTED NOT DETECTED Final    Comment: Performed at [facility], [address] [place], [place] [address]  C Difficile Quick Screen w PCR reflex     Status: Abnormal   Collection Time: 07/29/21  8:27 AM   Specimen: Stool  Result Value Ref Range Status   C Diff antigen POSITIVE (A) NEGATIVE Final   C Diff toxin POSITIVE (A) NEGATIVE Final   C Diff interpretation Toxin producing C. difficile detected.  Final    Comment: CRITICAL RESULT CALLED TO, READ BACK BY AND VERIFIED WITH: CALLENDER, J. RN $Rem'@0952'vDFA$  ON 2.6.23 BY [facility] Performed at [place] [address], [place], [place] [address]   MRSA Next Gen by PCR, Nasal     Status: None   Collection Time: 07/29/21  8:50 AM  Result Value Ref Range Status   MRSA by PCR Next Gen NOT DETECTED NOT DETECTED Final    Comment: (NOTE) The GeneXpert MRSA Assay (FDA approved for NASAL specimens only), is one component of a comprehensive MRSA colonization surveillance program. It is not intended to diagnose MRSA infection nor to guide or monitor treatment for MRSA infections. Test performance is not FDA approved in patients less than 60 years old. Performed at [facility], [address] [place], [place]  [address]       Radiology Studies: CT Head Wo Contrast  Result Date: 07/28/2021 CLINICAL DATA:  Status post fall.  Memory loss. EXAM: CT  HEAD WITHOUT CONTRAST TECHNIQUE: Contiguous axial images were obtained from the base of the skull through the vertex without intravenous contrast. RADIATION DOSE REDUCTION: This exam was performed according to the departmental dose-optimization program which includes automated exposure control, adjustment of the mA and/or kV according to patient size and/or use of iterative reconstruction technique. COMPARISON:  06/22/2020 FINDINGS: Brain: No evidence of acute infarction, hemorrhage, extra-axial collection, ventriculomegaly, or mass effect. Generalized cerebral atrophy. Periventricular white matter low attenuation likely secondary to microangiopathy. Vascular: Cerebrovascular atherosclerotic calcifications are noted. Skull: Negative for fracture or focal lesion. Sinuses/Orbits: Visualized portions of the orbits are unremarkable. Visualized portions of the paranasal sinuses are unremarkable. Visualized portions of the mastoid air cells are unremarkable. Other: None. IMPRESSION: 1. No acute intracranial abnormality. 2. Generalized cerebral atrophy and chronic microvascular disease. Electronically Signed   By: Kathreen Devoid M.D.   On: 07/28/2021 15:17   DG Chest 1V REPEAT Same Day  Result Date: 07/28/2021 CLINICAL DATA:  Increasing hypoxia. EXAM: CHEST - 1 VIEW SAME DAY COMPARISON:  07/28/2021. FINDINGS: The heart is stable in size and the mediastinal contour is within normal limits. Atherosclerotic calcification of the aorta is noted. The pulmonary vasculature is distended. Increased interstitial opacities are noted bilaterally with a perihilar predominance. No effusion or pneumothorax. No acute osseous abnormality IMPRESSION: 1. Distended pulmonary vasculature. 2. Diffuse interstitial thickening bilaterally with a perihilar predominance suggesting pulmonary edema or infiltrate. Electronically Signed   By: Brett Fairy M.D.   On: 07/28/2021 23:28   DG CHEST PORT 1 VIEW  Result Date: 07/30/2021 CLINICAL DATA:   Hypoxia. EXAM: PORTABLE CHEST 1 VIEW COMPARISON:  Radiographs 07/28/2021 FINDINGS: 1001 hours. The heart size and mediastinal contours are stable with aortic atherosclerosis. The diffuse pulmonary opacities seen on the most recent study have partially cleared, most consistent with resolving edema. There is residual asymmetric airspace disease in the left lower lobe with associated mild volume loss. There may be a small left pleural effusion. No evidence of pneumothorax. The bones appear unchanged. Telemetry leads overlie the chest. IMPRESSION: 1. Resolving diffuse pulmonary opacities consistent with resolving edema. 2. Residual focal left lower lobe airspace disease with volume loss and a possible small left pleural effusion. This is suspicious for possible aspiration. Continued follow-up recommended. Electronically Signed   By: Richardean Sale M.D.   On: 07/30/2021 10:43   DG Chest Port 1 View  Result Date: 07/28/2021 CLINICAL DATA:  Chest pain EXAM: PORTABLE CHEST 1 VIEW COMPARISON:  None. FINDINGS: Heart size and mediastinal contours are within normal limits. Lungs are clear. No pleural effusion or pneumothorax is seen. Osseous structures about the chest are unremarkable. IMPRESSION: No acute findings. No evidence of pneumonia or pulmonary edema. No osseous fracture or dislocation is seen. Electronically Signed   By: Franki Cabot M.D.   On: 07/28/2021 15:17     Scheduled Meds:  cholecalciferol  2,000 Units Oral Daily   diltiazem  30 mg Oral Q6H   donepezil  10 mg Oral QHS   enoxaparin (LOVENOX) injection  40 mg Subcutaneous Q24H   levothyroxine  50 mcg Oral Daily   mouth rinse  15 mL Mouth Rinse BID   sertraline  100 mg Oral Daily   traZODone  100 mg Oral QHS   vancomycin  125 mg Oral QID   Continuous Infusions:  diltiazem (CARDIZEM) infusion 10 mg/hr (07/30/21 1305)   potassium chloride       LOS: 1 day    Flora Lipps, MD [facility] 07/30/2021, 1:14 PM

## 2021-07-30 NOTE — Assessment & Plan Note (Addendum)
Likely secondary to volume depletion.  Sodium has normalized at 137 today.

## 2021-07-31 DIAGNOSIS — I4891 Unspecified atrial fibrillation: Secondary | ICD-10-CM | POA: Diagnosis not present

## 2021-07-31 LAB — COMPREHENSIVE METABOLIC PANEL
ALT: 17 U/L (ref 0–44)
AST: 19 U/L (ref 15–41)
Albumin: 2.4 g/dL — ABNORMAL LOW (ref 3.5–5.0)
Alkaline Phosphatase: 72 U/L (ref 38–126)
Anion gap: 5 (ref 5–15)
BUN: 12 mg/dL (ref 8–23)
CO2: 24 mmol/L (ref 22–32)
Calcium: 8.2 mg/dL — ABNORMAL LOW (ref 8.9–10.3)
Chloride: 101 mmol/L (ref 98–111)
Creatinine, Ser: 0.76 mg/dL (ref 0.44–1.00)
GFR, Estimated: >60 mL/min (ref >60)
Glucose, Bld: 106 mg/dL — ABNORMAL HIGH (ref 70–99)
Potassium: 3.7 mmol/L (ref 3.5–5.1)
Sodium: 130 mmol/L — ABNORMAL LOW (ref 135–145)
Total Bilirubin: 0.8 mg/dL (ref 0.3–1.2)
Total Protein: 5.1 g/dL — ABNORMAL LOW (ref 6.5–8.1)

## 2021-07-31 LAB — CBC
HCT: 34.3 % — ABNORMAL LOW (ref 36.0–46.0)
Hemoglobin: 11.2 g/dL — ABNORMAL LOW (ref 12.0–15.0)
MCH: 31.7 pg (ref 26.0–34.0)
MCHC: 32.7 g/dL (ref 30.0–36.0)
MCV: 97.2 fL (ref 80.0–100.0)
Platelets: 179 10*3/uL (ref 150–400)
RBC: 3.53 MIL/uL — ABNORMAL LOW (ref 3.87–5.11)
RDW: 14.3 % (ref 11.5–15.5)
WBC: 16.8 10*3/uL — ABNORMAL HIGH (ref 4.0–10.5)
nRBC: 0 % (ref 0.0–0.2)

## 2021-07-31 LAB — MAGNESIUM: Magnesium: 2 mg/dL (ref 1.7–2.4)

## 2021-07-31 LAB — PHOSPHORUS: Phosphorus: 1.6 mg/dL — ABNORMAL LOW (ref 2.5–4.6)

## 2021-07-31 MED ORDER — POTASSIUM PHOSPHATES 15 MMOLE/5ML IV SOLN
30.0000 mmol | Freq: Once | INTRAVENOUS | Status: AC
  Administered 2021-07-31: 30 mmol via INTRAVENOUS
  Filled 2021-07-31: qty 10

## 2021-07-31 MED ORDER — DILTIAZEM HCL ER COATED BEADS 120 MG PO CP24
120.0000 mg | ORAL_CAPSULE | Freq: Every day | ORAL | Status: DC
  Administered 2021-07-31 – 2021-08-02 (×3): 120 mg via ORAL
  Filled 2021-07-31 (×3): qty 1

## 2021-07-31 NOTE — Progress Notes (Addendum)
PROGRESS NOTE    Mallory Grant  WPV:[id number] DOB: 1928-07-26 DOA: 07/28/2021 PCP: Shirline Frees, MD    Brief Narrative:  Patient is a 88 years old female with history of dementia, paroxysmal atrial fibrillation from memory care unit presented to hospital with fall, difficulty ambulating and altered mental status.  In the ED, patient was febrile with a leukocytosis and had some tachypnea.  Lactic acid was elevated and blood pressure slightly low.  Patient was then admitted to the hospital for possible sepsis with hypotension.  Stool analysis however showed C. difficile infection.  Antibiotics were initially started which was subsequently discontinued.  During hospitalization, patient had atrial fibrillation with RVR and was put on Cardizem drip followed by oral Cardizem..  She also had increased requirement with oxygen but chest x-ray showed improved  infiltrates.  Currently oxygen has been weaned down.     Assessment and Plan: * Severe sepsis ([place])- (present on admission) Patient met criteria for sepsis on presentation with hypotension tachycardia fever tachypnea and elevated lactate.  Received IV fluids.  Initially patient received cefepime, vancomycin and Flagyl. COVID and flu neg. Urinalysis was negative.  Chest x-ray was negative for acute findings.  Blood cultures negative in 2 days.  stool for C. difficile was positive for C. difficile infection.  We will continue with oral vancomycin.  Rest of the antibiotics have been discontinued.  C. difficile diarrhea- (present on admission) Continue oral vancomycin.  GI pathogen panel negative fever has improved Tmax of 98.9 F.  Leukocytosis at 16.8 trending down from 19.8.Marland Kitchen  Atrial fibrillation with rapid ventricular response (HCC) Controlled at this time.  Patient does have a history of lone atrial fibrillation paroxysmal in nature but was not on any nodal blockers or anticoagulation at home.  During hospitalization patient had atrial  fibrillation with RVR and required Cardizem p.o.  This will be changed to long-acting starting today.  2D echocardiogram done yesterday showed LV ejection fraction of 65 to 70%, with out left atrial dilatation.  Has-bled score of 1, CHA2DS2-VASc score 3 or more.  Discussed with the patient's son about risk versus benefits of anticoagulation.  He states that that she has had falls in the past and wishes to discuss with her family member regarding blood thinners.  Spoke about stroke risk in this situation but patient was not on anticoagulation prior to coming to the hospital either..   Acute respiratory failure with hypoxia ([place])- (present on admission) Currently on 2 L of oxygen.  Repeat chest x-ray on 07/30/2021 showed improving infiltrates but possible opacity in the left lung.  We will continue incentive spirometry, propped up position, supplemental oxygen.  Wean as able.  We will try to sit up in the chair and ambulate as possible  Hypokalemia Replenished and improved.  Latest potassium of 3.7.  Hyponatremia Mild.  Latest sodium of 130.  Likely secondary to diarrhea.  We will continue to monitor.   Acute metabolic encephalopathy- (present on admission) Currently at baseline.  Patient does have a history of chronic dementia.  Improved encephalopathy at this time..  Hypothyroidism- (present on admission) Continue Synthroid.     DVT prophylaxis: enoxaparin (LOVENOX) injection 40 mg Start: 07/29/21 1000   Code Status:     Code Status: Full Code  Disposition:  Status is: Inpatient Remains inpatient appropriate because: C. difficile colitis, hypoxic respiratory failure, atrial fibrillation with RVR    Family Communication:  Spoke with the patient's son on 07/31/2021.  Consultants:  None  Procedures:  None  Antimicrobials:  Oral vancomycin 07/29/21>  Anti-infectives (From admission, onward)    Start     Dose/Rate Route Frequency Ordered Stop   07/30/21 1800  vancomycin  (VANCOREADY) IVPB 1750 mg/350 mL  Status:  Discontinued        1,750 mg 175 mL/hr over 120 Minutes Intravenous Every 48 hours 07/28/21 1907 07/29/21 1243   07/29/21 1200  vancomycin (VANCOCIN) capsule 125 mg        125 mg Oral 4 times daily 07/29/21 1016 08/08/21 1359   07/29/21 0600  ceFEPIme (MAXIPIME) 2 g in sodium chloride 0.9 % 100 mL IVPB  Status:  Discontinued        2 g 200 mL/hr over 30 Minutes Intravenous Every 12 hours 07/28/21 1907 07/29/21 1243   07/29/21 0600  metroNIDAZOLE (FLAGYL) IVPB 500 mg  Status:  Discontinued        500 mg 100 mL/hr over 60 Minutes Intravenous Every 12 hours 07/28/21 2213 07/29/21 1243   07/28/21 1800  ceFEPIme (MAXIPIME) 2 g in sodium chloride 0.9 % 100 mL IVPB        2 g 200 mL/hr over 30 Minutes Intravenous  Once 07/28/21 1751 07/28/21 1912   07/28/21 1800  metroNIDAZOLE (FLAGYL) IVPB 500 mg        500 mg 100 mL/hr over 60 Minutes Intravenous  Once 07/28/21 1751 07/28/21 2021   07/28/21 1800  vancomycin (VANCOCIN) IVPB 1000 mg/200 mL premix        1,000 mg 200 mL/hr over 60 Minutes Intravenous  Once 07/28/21 1751 07/28/21 2130      Subjective: Today, patient was seen and examined at bedside.  Patient was seen and examined at bedside.  Patient denies any shortness of breath dizziness nausea vomiting or abdominal pain.  Has had 1 loose bowel movement today.  Overall poor historian.  Objective: Vitals:   07/30/21 2100 07/30/21 2149 07/30/21 2321 07/31/21 0500  BP: (!) 114/50   (!) 111/47  Pulse: 80   80  Resp: 19   18  Temp: 98.9 F (37.2 C)   98.2 F (36.8 C)  TempSrc: Oral   Oral  SpO2: 96% 94% 94% 96%  Weight:      Height:        Intake/Output Summary (Last 24 hours) at 07/31/2021 1111 Last data filed at 07/31/2021 0900 Gross per 24 hour  Intake 875.34 ml  Output --  Net 875.34 ml   Filed Weights   07/28/21 1440  Weight: 65.8 kg   Physical Examination: General:  Average built, not in obvious distress, on nasal cannula  oxygen HENT:   No scleral pallor or icterus noted. Oral mucosa is moist.  Chest:   Diminished breath sounds bilaterally. No crackles or wheezes.  CVS: S1 &S2 heard. No murmur.  Regular rate and rhythm. Abdomen: Soft, nontender, nondistended.  Bowel sounds are heard.   Extremities: No cyanosis, clubbing or edema.  Peripheral pulses are palpable. Psych: Alert, awake and communicative. CNS:  No cranial nerve deficits.  Power equal in all extremities.   Skin: Warm and dry.  No rashes noted.  Data Reviewed:   CBC: Recent Labs  Lab 07/28/21 1450 07/29/21 0018 07/30/21 0426 07/31/21 0404  WBC 24.4* 23.8* 19.8* 16.8*  NEUTROABS 21.6*  --   --   --   HGB 12.7 12.4 12.6 11.2*  HCT 37.9 37.2 38.2 34.3*  MCV 94.5 96.9 95.7 97.2  PLT 239 201 187 539    Basic Metabolic Panel:  Recent Labs  Lab 07/28/21 1450 07/29/21 0018 07/30/21 0426 07/31/21 0404  NA 132* 131* 130* 130*  K 3.5 3.3* 3.6 3.7  CL 97* 99 98 101  CO2 $Re'27 22 25 24  'iWX$ GLUCOSE 117* 119* 118* 106*  BUN $Re'13 13 13 12  'idm$ CREATININE 0.91 0.73 0.78 0.76  CALCIUM 8.9 8.5* 8.5* 8.2*  MG  --   --  1.8 2.0  PHOS  --   --   --  1.6*    GFR: Estimated Creatinine Clearance: 43.8 mL/min (by C-G formula based on SCr of 0.76 mg/dL).  Liver Function Tests: Recent Labs  Lab 07/28/21 1450 07/29/21 0018 07/30/21 0426 07/31/21 0404  AST $Re'31 30 31 19  'Oeo$ ALT $R'22 21 20 17  'eY$ ALKPHOS 117 96 87 72  BILITOT 0.9 1.0 0.9 0.8  PROT 7.0 6.2* 5.8* 5.1*  ALBUMIN 3.7 3.3* 2.9* 2.4*    CBG: No results for input(s): GLUCAP in the last 168 hours.   Recent Results (from the past 240 hour(s))  Resp Panel by RT-PCR (Flu A&B, Covid) Nasopharyngeal Swab     Status: None   Collection Time: 07/28/21  3:31 PM   Specimen: Nasopharyngeal Swab; Nasopharyngeal(NP) swabs in vial transport medium  Result Value Ref Range Status   SARS Coronavirus 2 by RT PCR NEGATIVE NEGATIVE Final    Comment: (NOTE) SARS-CoV-2 target nucleic acids are NOT DETECTED.  The  SARS-CoV-2 RNA is generally detectable in upper respiratory specimens during the acute phase of infection. The lowest concentration of SARS-CoV-2 viral copies this assay can detect is 138 copies/mL. A negative result does not preclude SARS-Cov-2 infection and should not be used as the sole basis for treatment or other patient management decisions. A negative result may occur with  improper specimen collection/handling, submission of specimen other than nasopharyngeal swab, presence of viral mutation(s) within the areas targeted by this assay, and inadequate number of viral copies(<138 copies/mL). A negative result must be combined with clinical observations, patient history, and epidemiological information. The expected result is Negative.  Fact Sheet for Patients:  EntrepreneurPulse.com.au  Fact Sheet for Healthcare Providers:  IncredibleEmployment.be  This test is no t yet approved or cleared by the [place] FDA and  has been authorized for detection and/or diagnosis of SARS-CoV-2 by FDA under an Emergency Use Authorization (EUA). This EUA will remain  in effect (meaning this test can be used) for the duration of the COVID-19 declaration under Section 564(b)(1) of the Act, 21 U.S.C.section 360bbb-3(b)(1), unless the authorization is terminated  or revoked sooner.       Influenza A by PCR NEGATIVE NEGATIVE Final   Influenza B by PCR NEGATIVE NEGATIVE Final    Comment: (NOTE) The Xpert Xpress SARS-CoV-2/FLU/RSV plus assay is intended as an aid in the diagnosis of influenza from Nasopharyngeal swab specimens and should not be used as a sole basis for treatment. Nasal washings and aspirates are unacceptable for Xpert Xpress SARS-CoV-2/FLU/RSV testing.  Fact Sheet for Patients: EntrepreneurPulse.com.au  Fact Sheet for Healthcare Providers: IncredibleEmployment.be  This test is not yet approved or  cleared by the [place] FDA and has been authorized for detection and/or diagnosis of SARS-CoV-2 by FDA under an Emergency Use Authorization (EUA). This EUA will remain in effect (meaning this test can be used) for the duration of the COVID-19 declaration under Section 564(b)(1) of the Act, 21 U.S.C. section 360bbb-3(b)(1), unless the authorization is terminated or revoked.  Performed at [facility], [address] [place],  [place] [address]   Culture, blood (routine x 2)     Status: None (Preliminary result)   Collection Time: 07/28/21  7:00 PM   Specimen: BLOOD LEFT FOREARM  Result Value Ref Range Status   Specimen Description   Final    BLOOD LEFT FOREARM Performed at [facility], [place] [address] [place], [place] [address]    Special Requests   Final    Blood Culture adequate volume BOTTLES DRAWN AEROBIC AND ANAEROBIC Performed at [facility], [address] [place], [place] [address]    Culture   Final    NO GROWTH 3 DAYS Performed at [facility], [place] [address] [place], [place] [address]    Report Status PENDING  Incomplete  Culture, blood (routine x 2)     Status: None (Preliminary result)   Collection Time: 07/29/21 12:18 AM   Specimen: BLOOD  Result Value Ref Range Status   Specimen Description   Final    BLOOD SPECIMEN SOURCE NOT MARKED ON REQUISITION Performed at [place] [address], [place], [place] [address]    Special Requests   Final    BOTTLES DRAWN AEROBIC ONLY Blood Culture adequate volume Performed at [place] [address], [place], [place] [address]    Culture   Final    NO GROWTH 2 DAYS Performed at [place] [address], [place], [place] [address]    Report Status PENDING  Incomplete  Gastrointestinal Panel by PCR , Stool     Status: None   Collection Time: 07/29/21  8:27 AM   Specimen: Stool  Result Value Ref Range Status    Campylobacter species NOT DETECTED NOT DETECTED Final   Plesimonas shigelloides NOT DETECTED NOT DETECTED Final   Salmonella species NOT DETECTED NOT DETECTED Final   Yersinia enterocolitica NOT DETECTED NOT DETECTED Final   Vibrio species NOT DETECTED NOT DETECTED Final   Vibrio cholerae NOT DETECTED NOT DETECTED Final   Enteroaggregative E coli (EAEC) NOT DETECTED NOT DETECTED Final   Enteropathogenic E coli (EPEC) NOT DETECTED NOT DETECTED Final   Enterotoxigenic E coli (ETEC) NOT DETECTED NOT DETECTED Final   Shiga like toxin producing E coli (STEC) NOT DETECTED NOT DETECTED Final   Shigella/Enteroinvasive E coli (EIEC) NOT DETECTED NOT DETECTED Final   Cryptosporidium NOT DETECTED NOT DETECTED Final   Cyclospora cayetanensis NOT DETECTED NOT DETECTED Final   Entamoeba histolytica NOT DETECTED NOT DETECTED Final   Giardia lamblia NOT DETECTED NOT DETECTED Final   Adenovirus F40/41 NOT DETECTED NOT DETECTED Final   Astrovirus NOT DETECTED NOT DETECTED Final   Norovirus GI/GII NOT DETECTED NOT DETECTED Final   Rotavirus A NOT DETECTED NOT DETECTED Final   Sapovirus (I, II, IV, and V) NOT DETECTED NOT DETECTED Final    Comment: Performed at [facility], [address] [place], [place] [address]  C Difficile Quick Screen w PCR reflex     Status: Abnormal   Collection Time: 07/29/21  8:27 AM   Specimen: Stool  Result Value Ref Range Status   C Diff antigen POSITIVE (A) NEGATIVE Final   C Diff toxin POSITIVE (A) NEGATIVE Final   C Diff interpretation Toxin producing C. difficile detected.  Final    Comment: CRITICAL RESULT CALLED TO, READ BACK BY AND VERIFIED WITH: CALLENDER, J. RN $Rem'@0952'Naxr$  ON 2.6.23 BY [facility] Performed at [facility] [address], [place], [place] [address]  MRSA Next Gen by PCR, Nasal     Status: None   Collection Time: 07/29/21  8:50 AM  Result Value Ref Range Status   MRSA by PCR Next Gen NOT DETECTED NOT DETECTED Final     Comment: (NOTE) The GeneXpert MRSA Assay (FDA approved for NASAL specimens only), is one component of a comprehensive MRSA colonization surveillance program. It is not intended to diagnose MRSA infection nor to guide or monitor treatment for MRSA infections. Test performance is not FDA approved in patients less than 55 years old. Performed at [facility], [place] [address] [place], [place] [address]       Radiology Studies: DG CHEST PORT 1 VIEW  Result Date: 07/30/2021 CLINICAL DATA:  Hypoxia. EXAM: PORTABLE CHEST 1 VIEW COMPARISON:  Radiographs 07/28/2021 FINDINGS: 1001 hours. The heart size and mediastinal contours are stable with aortic atherosclerosis. The diffuse pulmonary opacities seen on the most recent study have partially cleared, most consistent with resolving edema. There is residual asymmetric airspace disease in the left lower lobe with associated mild volume loss. There may be a small left pleural effusion. No evidence of pneumothorax. The bones appear unchanged. Telemetry leads overlie the chest. IMPRESSION: 1. Resolving diffuse pulmonary opacities consistent with resolving edema. 2. Residual focal left lower lobe airspace disease with volume loss and a possible small left pleural effusion. This is suspicious for possible aspiration. Continued follow-up recommended. Electronically Signed   By: Richardean Sale M.D.   On: 07/30/2021 10:43   ECHOCARDIOGRAM COMPLETE  Result Date: 07/30/2021    ECHOCARDIOGRAM REPORT   Patient Name:   YARIELIS FUNARO Date of Exam: 07/30/2021 Medical Rec #:  [id number]     Height:       66.0 in Accession #:    [id number]    Weight:       145.1 lb Date of Birth:  01-Sep-1928    BSA:          1.745 m Patient Age:    77 years      BP:           94/49 mmHg Patient Gender: F             HR:           87 bpm. Exam Location:  Inpatient Procedure: 2D Echo Indications:    Dyspnea  History:        Patient has prior history of Echocardiogram examinations,  most                 recent 04/19/2014. Arrythmias:Atrial Fibrillation.  Sonographer:    Jefferey Pica Referring Phys: [id number] [place]  1. Left ventricular ejection fraction, by estimation, is 65 to 70%. The left ventricle has normal function. The left ventricle has no regional wall motion abnormalities. Left ventricular diastolic function could not be evaluated.  2. Right ventricular systolic function is normal. The right ventricular size is normal. There is normal pulmonary artery systolic pressure.  3. The mitral valve is normal in structure. Mild mitral valve regurgitation.  4. The aortic valve is tricuspid. Aortic valve regurgitation is not visualized. Aortic valve sclerosis is present, with no evidence of aortic valve stenosis.  5. The inferior vena cava is dilated in size with >50% respiratory variability, suggesting right atrial pressure of 8 mmHg. Comparison(s): Prior images unable to be directly viewed, comparison made by report only. FINDINGS  Left Ventricle: Left ventricular ejection fraction, by estimation, is 65 to 70%. The left ventricle  has normal function. The left ventricle has no regional wall motion abnormalities. The left ventricular internal cavity size was normal in size. There is  no left ventricular hypertrophy. Left ventricular diastolic function could not be evaluated due to atrial fibrillation. Left ventricular diastolic function could not be evaluated. Right Ventricle: The right ventricular size is normal. No increase in right ventricular wall thickness. Right ventricular systolic function is normal. There is normal pulmonary artery systolic pressure. The tricuspid regurgitant velocity is 2.46 m/s, and  with an assumed right atrial pressure of 8 mmHg, the estimated right ventricular systolic pressure is 16.1 mmHg. Left Atrium: Left atrial size was normal in size. Right Atrium: Right atrial size was normal in size. Pericardium: There is no evidence of pericardial  effusion. Mitral Valve: The mitral valve is normal in structure. Mild mitral valve regurgitation, with centrally-directed jet. Tricuspid Valve: The tricuspid valve is normal in structure. Tricuspid valve regurgitation is mild. Aortic Valve: The aortic valve is tricuspid. Aortic valve regurgitation is not visualized. Aortic valve sclerosis is present, with no evidence of aortic valve stenosis. Aortic valve peak gradient measures 6.2 mmHg. Pulmonic Valve: The pulmonic valve was normal in structure. Pulmonic valve regurgitation is not visualized. Aorta: The aortic root and ascending aorta are structurally normal, with no evidence of dilitation. Venous: The inferior vena cava is dilated in size with greater than 50% respiratory variability, suggesting right atrial pressure of 8 mmHg. IAS/Shunts: No atrial level shunt detected by color flow Doppler.  LEFT VENTRICLE PLAX 2D LVIDd:         3.90 cm   Diastology LVIDs:         2.20 cm   LV e' medial:    8.76 cm/s LV PW:         0.90 cm   LV E/e' medial:  11.8 LV IVS:        0.90 cm   LV e' lateral:   11.50 cm/s LVOT diam:     1.80 cm   LV E/e' lateral: 9.0 LV SV:         45 LV SV Index:   26 LVOT Area:     2.54 cm  RIGHT VENTRICLE             IVC RV Basal diam:  3.54 cm     IVC diam: 2.40 cm RV S prime:     11.50 cm/s TAPSE (M-mode): 1.5 cm LEFT ATRIUM             Index        RIGHT ATRIUM           Index LA diam:        3.10 cm 1.78 cm/m   RA Area:     10.90 cm LA Vol (A2C):   47.4 ml 27.17 ml/m  RA Volume:   21.10 ml  12.09 ml/m LA Vol (A4C):   39.1 ml 22.41 ml/m LA Biplane Vol: 44.5 ml 25.51 ml/m  AORTIC VALVE                 PULMONIC VALVE AV Area (Vmax): 2.18 cm     PV Vmax:       0.73 m/s AV Vmax:        125.00 cm/s  PV Peak grad:  2.1 mmHg AV Peak Grad:   6.2 mmHg LVOT Vmax:      107.00 cm/s LVOT Vmean:     65.700 cm/s LVOT VTI:       0.178  m  AORTA Ao Root diam: 3.10 cm Ao Asc diam:  3.20 cm MITRAL VALVE                TRICUSPID VALVE MV Area (PHT): 3.46 cm      TR Peak grad:   24.2 mmHg MV Decel Time: 219 msec     TR Vmax:        246.00 cm/s MR Peak grad: 80.3 mmHg MR Vmax:      448.00 cm/s   SHUNTS MV E velocity: 103.00 cm/s  Systemic VTI:  0.18 m                             Systemic Diam: 1.80 cm Mihai Croitoru MD Electronically signed by Sanda Klein MD Signature Date/Time: 07/30/2021/4:45:18 PM    Final      Scheduled Meds:  cholecalciferol  2,000 Units Oral Daily   diltiazem  120 mg Oral Daily   donepezil  10 mg Oral QHS   enoxaparin (LOVENOX) injection  40 mg Subcutaneous Q24H   levothyroxine  50 mcg Oral Daily   mouth rinse  15 mL Mouth Rinse BID   sertraline  100 mg Oral Daily   traZODone  100 mg Oral QHS   vancomycin  125 mg Oral QID   Continuous Infusions:  potassium PHOSPHATE IVPB (in mmol)       LOS: 2 days    Flora Lipps, MD [facility] 07/31/2021, 11:11 AM

## 2021-07-31 NOTE — Assessment & Plan Note (Addendum)
Controlled at this time.  Patient does have a history of lone atrial fibrillation paroxysmal in nature.  On long-acting Cardizem at this time.  2D echocardiogram done to 07/30/21 showed LV ejection fraction of 65 to 70%, with out left atrial dilatation.  Has-bled score of 1, CHA2DS2-VASc score 3 or more.  Spoke with the patient's son regarding anticoagulation including risk versus benefits and he wishes to talk this with her primary care physician/cardiologist but is okay with aspirin alone.  He is aware of the risk of stroke without anticoagulation.  We will continue aspirin on discharge.

## 2021-08-01 DIAGNOSIS — R5381 Other malaise: Secondary | ICD-10-CM

## 2021-08-01 DIAGNOSIS — R531 Weakness: Secondary | ICD-10-CM

## 2021-08-01 LAB — BASIC METABOLIC PANEL
Anion gap: 4 — ABNORMAL LOW (ref 5–15)
BUN: 10 mg/dL (ref 8–23)
CO2: 27 mmol/L (ref 22–32)
Calcium: 8.1 mg/dL — ABNORMAL LOW (ref 8.9–10.3)
Chloride: 105 mmol/L (ref 98–111)
Creatinine, Ser: 0.54 mg/dL (ref 0.44–1.00)
GFR, Estimated: >60 mL/min (ref >60)
Glucose, Bld: 102 mg/dL — ABNORMAL HIGH (ref 70–99)
Potassium: 3.3 mmol/L — ABNORMAL LOW (ref 3.5–5.1)
Sodium: 136 mmol/L (ref 135–145)

## 2021-08-01 LAB — CBC
HCT: 34.4 % — ABNORMAL LOW (ref 36.0–46.0)
Hemoglobin: 11.3 g/dL — ABNORMAL LOW (ref 12.0–15.0)
MCH: 31.7 pg (ref 26.0–34.0)
MCHC: 32.8 g/dL (ref 30.0–36.0)
MCV: 96.4 fL (ref 80.0–100.0)
Platelets: 205 10*3/uL (ref 150–400)
RBC: 3.57 MIL/uL — ABNORMAL LOW (ref 3.87–5.11)
RDW: 14.3 % (ref 11.5–15.5)
WBC: 9.6 10*3/uL (ref 4.0–10.5)
nRBC: 0.2 % (ref 0.0–0.2)

## 2021-08-01 LAB — MAGNESIUM: Magnesium: 2 mg/dL (ref 1.7–2.4)

## 2021-08-01 MED ORDER — POTASSIUM CHLORIDE CRYS ER 20 MEQ PO TBCR
40.0000 meq | EXTENDED_RELEASE_TABLET | Freq: Two times a day (BID) | ORAL | Status: AC
  Administered 2021-08-01 – 2021-08-02 (×3): 40 meq via ORAL
  Filled 2021-08-01 (×3): qty 2

## 2021-08-01 MED ORDER — ASPIRIN 81 MG PO CHEW
81.0000 mg | CHEWABLE_TABLET | Freq: Every day | ORAL | Status: DC
  Administered 2021-08-01 – 2021-08-02 (×2): 81 mg via ORAL
  Filled 2021-08-01 (×2): qty 1

## 2021-08-01 NOTE — TOC Progression Note (Signed)
Transition of Care [facility]) - Progression Note    Patient Details  Name: Mallory Grant MRN: [id number] Date of Birth: Jul 24, 1928  [facility] CM/SW Contact  Purcell Mouton, RN Phone Number: 08/01/2021, 2:03 PM  Clinical Narrative:    Clinicals f to ALF.   Expected Discharge Plan: Assisted Living Barriers to Discharge: No Barriers Identified  Expected Discharge Plan and Services Expected Discharge Plan: Assisted Living   Discharge Planning Services: CM Consult Post Acute Care Choice: Nursing Home Living arrangements for the past 2 months: Assisted Living Facility                                       Social Determinants of Health (SDOH) Interventions    Readmission Risk Interventions No flowsheet data found.

## 2021-08-01 NOTE — Care Management Important Message (Signed)
Important Message  Patient Details IM Letter placed in Patients room. Name: Mallory Grant MRN: [id number] Date of Birth: 1929-03-02   Medicare Important Message Given:  Yes     Kerin Salen 08/01/2021, 12:27 PM

## 2021-08-01 NOTE — Assessment & Plan Note (Addendum)
Patient is from assisted living facility.  We will schedule for home health on discharge

## 2021-08-01 NOTE — Assessment & Plan Note (Addendum)
Received potassium phosphate IV on 07/31/2021.Marland Kitchen  Phosphate level of 2.5 at this time

## 2021-08-01 NOTE — Evaluation (Signed)
Physical Therapy Evaluation Patient Details Name: Mallory Grant MRN: [id number] DOB: [birth year] Today's Date: 08/01/2021  History of Present Illness  Patient is a 88 years old female with history of dementia, paroxysmal atrial fibrillation from memory care unit presented to hospital 07/28/21  with fall, difficulty ambulating and altered mental status.  In the ED, patient was febrile with a leukocytosis,tachypnea.  Patient admitted to the hospital for possible sepsis with hypotension,C. difficile infection. since in hospital, patient had atrial fibrillation with RVR and had  increased requirement with oxygen. Repeat chest x-ray on 07/30/2021 showed improving infiltrates but possible opacity in the left lung.  Clinical Impression  The patient is very sweet. Oriented to self and in hospital and ambulates with a RW. Patient resides in ALF/memory care.  Patient  required min/mod assistance to mobilize to [facility] then recliner with standing and taking steps. Did not ambulate as most likely will need a depends due to loose BM and none available at this time.Patient currently  will require assistance for mobility, especially if there is BM urgency. Pt admitted with above diagnosis.  Pt currently with functional limitations due to the deficits listed below (see PT Problem List). Pt will benefit from skilled PT to increase their independence and safety with mobility to allow discharge to the venue listed below.          Recommendations for follow up therapy are one component of a multi-disciplinary discharge planning process, led by the attending physician.  Recommendations may be updated based on patient status, additional functional criteria and insurance authorization.  Follow Up Recommendations Home health PT (at ALF)    Assistance Recommended at Discharge Frequent or constant Supervision/Assistance  Patient can return home with the following  A little help with walking and/or transfers;A little help with  bathing/dressing/bathroom;Direct supervision/assist for medications management;Assist for transportation;Assistance with cooking/housework;Help with stairs or ramp for entrance    Equipment Recommendations None recommended by PT  Recommendations for Other Services       Functional Status Assessment Patient has had a recent decline in their functional status and demonstrates the ability to make significant improvements in function in a reasonable and predictable amount of time.     Precautions / Restrictions Precautions Precautions: Fall Precaution Comments: c diff with frequent loose stools, monitor O2/HR      Mobility  Bed Mobility Overal bed mobility: Needs Assistance Bed Mobility: Supine to Sit     Supine to sit: Min assist     General bed mobility comments: cues to initiate activity, assist  with trunk and legs    Transfers Overall transfer level: Needs assistance Equipment used: 1 person hand held assist Transfers: Sit to/from Stand, Bed to chair/wheelchair/BSC Sit to Stand: Mod assist           General transfer comment: mod assist to stand with Patient holding therapist's hands to rise. Able to step around to [facility], cues to reach to armrest. Stands from [facility] with min assist and  steps to recliner, supported on the bed rail and therapist handhold    Ambulation/Gait               General Gait Details: NT due to BM frequency  Stairs            Wheelchair Mobility    Modified Rankin (Stroke Patients Only)       Balance Overall balance assessment: Needs assistance, History of Falls Sitting-balance support: Feet supported Sitting balance-Leahy Scale: Fair     Standing balance  support: During functional activity, Single extremity supported, Reliant on assistive device for balance Standing balance-Leahy Scale: Fair Standing balance comment: with support                             Pertinent Vitals/Pain Pain Assessment Pain  Assessment: No/denies pain    Home Living Family/patient expects to be discharged to:: Assisted living                 Home Equipment: Rollator (4 wheels) Additional Comments: per son uses Rollator, resides in  memory care ALF    Prior Function Prior Level of Function : Needs assist  Cognitive Assist : Mobility (cognitive);ADLs (cognitive)           Mobility Comments: uses rollator, unsure if independnetly ambulates in  ALF       Hand Dominance        Extremity/Trunk Assessment   Upper Extremity Assessment Upper Extremity Assessment: Generalized weakness    Lower Extremity Assessment Lower Extremity Assessment: Generalized weakness    Cervical / Trunk Assessment Cervical / Trunk Assessment: Other exceptions;Kyphotic Cervical / Trunk Exceptions: leans far forward when sitting, cues to sit upright  Communication   Communication: No difficulties  Cognition     Overall Cognitive Status: History of cognitive impairments - at baseline                                 General Comments: oriented to hospital, follows dierections, states she walks with a RW        General Comments      Exercises     Assessment/Plan    PT Assessment Patient needs continued PT services  PT Problem List Decreased strength;Decreased mobility;Decreased safety awareness;Decreased activity tolerance;Decreased balance;Cardiopulmonary status limiting activity;Decreased knowledge of use of DME;Decreased cognition       PT Treatment Interventions DME instruction;Therapeutic activities;Cognitive remediation;Gait training;Therapeutic exercise;Patient/family education;Functional mobility training    PT Goals (Current goals can be found in the Care Plan section)  Acute Rehab PT Goals Patient Stated Goal: agreeable to getting up into chair for lunch PT Goal Formulation: With patient Time For Goal Achievement: 08/15/21 Potential to Achieve Goals: Good    Frequency Min  2X/week     Co-evaluation               AM-PAC PT "6 Clicks" Mobility  Outcome Measure Help needed turning from your back to your side while in a flat bed without using bedrails?: A Lot Help needed moving from lying on your back to sitting on the side of a flat bed without using bedrails?: A Lot Help needed moving to and from a bed to a chair (including a wheelchair)?: A Lot Help needed standing up from a chair using your arms (e.g., wheelchair or bedside chair)?: A Lot Help needed to walk in hospital room?: A Lot Help needed climbing 3-5 steps with a railing? : Total 6 Click Score: 11    End of Session Equipment Utilized During Treatment: Gait belt Activity Tolerance: Patient tolerated treatment well;Treatment limited secondary to medical complications (Comment) (need for BM) Patient left: in chair;with call bell/phone within reach (chair alrm batteries dead, told CNA.) Nurse Communication: Mobility status (had a BM) PT Visit Diagnosis: Unsteadiness on feet (R26.81);History of falling (Z91.81)    Time: 4098-1191 PT Time Calculation (min) (ACUTE ONLY): 30 min   Charges:   PT  Evaluation $PT Eval Low Complexity: 1 Low PT Treatments $Therapeutic Activity: 8-22 mins        Tresa Endo PT Acute Rehabilitation Services Pager [phone] Office [phone]   Claretha Cooper 08/01/2021, 1:35 PM

## 2021-08-01 NOTE — TOC Progression Note (Addendum)
Transition of Care [facility]) - Progression Note    Patient Details  Name: Mallory Grant MRN: [id number] Date of Birth: 12/04/1928  Transition of Care [facility]) CM/SW Contact  Purcell Mouton, RN Phone Number: 08/01/2021, 10:27 AM  Clinical Narrative:    Pt is from [facility] in [place]. ALF.    Barriers to Discharge: No Barriers Identified  Expected Discharge Plan and Services    Discharge Planning Services: CM Consult Post Acute Care Choice: Nursing Home                                        Social Determinants of Health (SDOH) Interventions    Readmission Risk Interventions No flowsheet data found.

## 2021-08-01 NOTE — TOC Progression Note (Signed)
Transition of Care [facility]) - Progression Note    Patient Details  Name: CHASMINE LENDER MRN: [id number] Date of Birth: 1929/06/19  Transition of Care [facility]) CM/SW Contact  Purcell Mouton, RN Phone Number: 08/01/2021, 12:26 PM  Clinical Narrative:    Spoke with pt's son concerning ALF and pt returning. Pt was in ALF and not Memory Care.   Expected Discharge Plan: Assisted Living Barriers to Discharge: No Barriers Identified  Expected Discharge Plan and Services Expected Discharge Plan: Assisted Living   Discharge Planning Services: CM Consult Post Acute Care Choice: Nursing Home Living arrangements for the past 2 months: Assisted Living Facility                                       Social Determinants of Health (SDOH) Interventions    Readmission Risk Interventions No flowsheet data found.

## 2021-08-01 NOTE — Progress Notes (Signed)
PROGRESS NOTE    Mallory Grant  WLN:[id number] DOB: [birth year] DOA: 07/28/2021 PCP: Shirline Frees, MD    Brief Narrative:  Patient is a 88 years old female with history of dementia, paroxysmal atrial fibrillation from memory care unit presented to hospital with fall, difficulty ambulating and altered mental status.  In the ED, patient was febrile with a leukocytosis and had some tachypnea.  Lactic acid was elevated and blood pressure slightly low.  Patient was then admitted to the hospital for possible sepsis with hypotension.  Stool analysis however showed C. difficile infection.  Antibiotics were initially started which was subsequently discontinued.  During hospitalization, patient had atrial fibrillation with RVR and was put on Cardizem drip followed by oral Cardizem..  She also had increased requirement with oxygen but chest x-ray showed improved  infiltrates.  Currently oxygen has been weaned down.     Assessment and Plan: * Severe sepsis ([place])- (present on admission) Likely secondary to C. difficile infection.  On oral vancomycin at this time.  COVID and flu neg. Urinalysis was negative.  Chest x-ray was negative for acute findings.  Blood cultures negative in 2 days.  stool for C. difficile was positive for C. difficile infection.    C. difficile diarrhea- (present on admission) Continue oral vancomycin.  GI pathogen panel negative, fever has improved Tmax of 99.2 F.  Leukocytosis has improved to 9.6 from 16.8 < 19.8.  Atrial fibrillation with rapid ventricular response (HCC) Controlled at this time.  Patient does have a history of lone atrial fibrillation paroxysmal in nature but was not on any nodal blockers or anticoagulation at home.  During hospitalization, patient had atrial fibrillation with RVR and required Cardizem which has been changed to long-acting since 07/31/2021.  2D echocardiogram done to 07/30/21 showed LV ejection fraction of 65 to 70%, with out left atrial dilatation.   Has-bled score of 1, CHA2DS2-VASc score 3 or more.  Spoke with the patient's son regarding anticoagulation including risk versus benefits and he wishes to talk this with her primary care physician/cardiologist but is okay with aspirin alone.  He is aware of the risk of stroke without anticoagulation.  Acute respiratory failure with hypoxia ([place])- (present on admission) Currently on 2 L of oxygen.  Repeat chest x-ray on 07/30/2021 showed improving infiltrates but possible opacity in the left lung.  We will continue incentive spirometry, propped up position, supplemental oxygen.  Wean as able.  We will try to sit up in the chair and ambulate as possible  Debility Patient is from assisted living facility.  We will get PT evaluation.  Hypophosphatemia Received potassium phosphate IV on 07/31/2021.Marland Kitchen  Check levels in a.m.  Hypokalemia Latest potassium was 3.3.  We will continue to replenish.    Hyponatremia Likely secondary to volume depletion.  Latest sodium of 136.  Acute metabolic encephalopathy- (present on admission) Currently at baseline.  Patient does have a history of chronic dementia.  Improved encephalopathy at this time..  Hypothyroidism- (present on admission) Continue Synthroid.    DVT prophylaxis: enoxaparin (LOVENOX) injection 40 mg Start: 07/29/21 1000   Code Status:     Code Status: Full Code  Disposition: Assisted living  Status is: Inpatient Remains inpatient appropriate because: C. difficile colitis, atrial fibrillation with RVR, patient is from assisted living.  Await PT evaluation.   Family Communication: Spoke with the patient's son at bedside.  Consultants:  None  Procedures:  None  Antimicrobials:  Vancomycin oral  Anti-infectives (From admission, onward)  Start     Dose/Rate Route Frequency Ordered Stop   07/30/21 1800  vancomycin (VANCOREADY) IVPB 1750 mg/350 mL  Status:  Discontinued        1,750 mg 175 mL/hr over 120 Minutes Intravenous Every 48  hours 07/28/21 1907 07/29/21 1243   07/29/21 1200  vancomycin (VANCOCIN) capsule 125 mg        125 mg Oral 4 times daily 07/29/21 1016 08/08/21 1359   07/29/21 0600  ceFEPIme (MAXIPIME) 2 g in sodium chloride 0.9 % 100 mL IVPB  Status:  Discontinued        2 g 200 mL/hr over 30 Minutes Intravenous Every 12 hours 07/28/21 1907 07/29/21 1243   07/29/21 0600  metroNIDAZOLE (FLAGYL) IVPB 500 mg  Status:  Discontinued        500 mg 100 mL/hr over 60 Minutes Intravenous Every 12 hours 07/28/21 2213 07/29/21 1243   07/28/21 1800  ceFEPIme (MAXIPIME) 2 g in sodium chloride 0.9 % 100 mL IVPB        2 g 200 mL/hr over 30 Minutes Intravenous  Once 07/28/21 1751 07/28/21 1912   07/28/21 1800  metroNIDAZOLE (FLAGYL) IVPB 500 mg        500 mg 100 mL/hr over 60 Minutes Intravenous  Once 07/28/21 1751 07/28/21 2021   07/28/21 1800  vancomycin (VANCOCIN) IVPB 1000 mg/200 mL premix        1,000 mg 200 mL/hr over 60 Minutes Intravenous  Once 07/28/21 1751 07/28/21 2130      Subjective: Today, patient was seen and examined at bedside.  Patient has had a bowel movement this morning.  Spoke with the patient's son at bedside regarding resources benefits of anticoagulation and he wishes to discuss this with his primary care physician/cardiologist.  Patient continues to feel better overall.  Denies nausea vomiting or abdominal pain.  Objective: Vitals:   07/31/21 1355 07/31/21 2102 08/01/21 0522 08/01/21 1000  BP: (!) 116/51 (!) 106/47 (!) 132/55   Pulse: 76 84 72   Resp: 18 20 18    Temp: 97.6 F (36.4 C) 99.2 F (37.3 C) 98.5 F (36.9 C)   TempSrc: Oral Oral Oral   SpO2: 96% 95% 93% 100%  Weight:      Height:        Intake/Output Summary (Last 24 hours) at 08/01/2021 1230 Last data filed at 08/01/2021 1227 Gross per 24 hour  Intake 870 ml  Output 7 ml  Net 863 ml   Filed Weights   07/28/21 1440  Weight: 65.8 kg    Physical Examination:  General:  Average built, not in obvious distress, on  nasal cannula oxygen HENT:   No scleral pallor or icterus noted. Oral mucosa is moist.  Chest:  Clear breath sounds.  Diminished breath sounds bilaterally. No crackles or wheezes.  CVS: S1 &S2 heard. No murmur.  Regular rate and rhythm. Abdomen: Soft, nontender, nondistended.  Bowel sounds are heard.   Extremities: No cyanosis, clubbing or edema.  Peripheral pulses are palpable. Psych: Alert, awake and comfortable normal mood CNS:  No cranial nerve deficits.  Power equal in all extremities.   Skin: Warm and dry.  No rashes noted.   Data Reviewed:   CBC: Recent Labs  Lab 07/28/21 1450 07/29/21 0018 07/30/21 0426 07/31/21 0404 08/01/21 0344  WBC 24.4* 23.8* 19.8* 16.8* 9.6  NEUTROABS 21.6*  --   --   --   --   HGB 12.7 12.4 12.6 11.2* 11.3*  HCT 37.9 37.2 38.2  34.3* 34.4*  MCV 94.5 96.9 95.7 97.2 96.4  PLT 239 201 187 179 237    Basic Metabolic Panel: Recent Labs  Lab 07/28/21 1450 07/29/21 0018 07/30/21 0426 07/31/21 0404 08/01/21 0344  NA 132* 131* 130* 130* 136  K 3.5 3.3* 3.6 3.7 3.3*  CL 97* 99 98 101 105  CO2 27 22 25 24 27   GLUCOSE 117* 119* 118* 106* 102*  BUN 13 13 13 12 10   CREATININE 0.91 0.73 0.78 0.76 0.54  CALCIUM 8.9 8.5* 8.5* 8.2* 8.1*  MG  --   --  1.8 2.0 2.0  PHOS  --   --   --  1.6*  --     Liver Function Tests: Recent Labs  Lab 07/28/21 1450 07/29/21 0018 07/30/21 0426 07/31/21 0404  AST 31 30 31 19   ALT 22 21 20 17   ALKPHOS 117 96 87 72  BILITOT 0.9 1.0 0.9 0.8  PROT 7.0 6.2* 5.8* 5.1*  ALBUMIN 3.7 3.3* 2.9* 2.4*     Radiology Studies: ECHOCARDIOGRAM COMPLETE  Result Date: 07/30/2021    ECHOCARDIOGRAM REPORT   Patient Name:   Mallory Grant Date of Exam: 07/30/2021 Medical Rec #:  [id number]     Height:       66.0 in Accession #:    [id number]    Weight:       145.1 lb Date of Birth:  12/09/1928    BSA:          1.745 m Patient Age:    89 years      BP:           94/49 mmHg Patient Gender: F             HR:           87 bpm. Exam  Location:  Inpatient Procedure: 2D Echo Indications:    Dyspnea  History:        Patient has prior history of Echocardiogram examinations, most                 recent 04/19/2014. Arrythmias:Atrial Fibrillation.  Sonographer:    Jefferey Pica Referring Phys: [id number] [place]  1. Left ventricular ejection fraction, by estimation, is 65 to 70%. The left ventricle has normal function. The left ventricle has no regional wall motion abnormalities. Left ventricular diastolic function could not be evaluated.  2. Right ventricular systolic function is normal. The right ventricular size is normal. There is normal pulmonary artery systolic pressure.  3. The mitral valve is normal in structure. Mild mitral valve regurgitation.  4. The aortic valve is tricuspid. Aortic valve regurgitation is not visualized. Aortic valve sclerosis is present, with no evidence of aortic valve stenosis.  5. The inferior vena cava is dilated in size with >50% respiratory variability, suggesting right atrial pressure of 8 mmHg. Comparison(s): Prior images unable to be directly viewed, comparison made by report only. FINDINGS  Left Ventricle: Left ventricular ejection fraction, by estimation, is 65 to 70%. The left ventricle has normal function. The left ventricle has no regional wall motion abnormalities. The left ventricular internal cavity size was normal in size. There is  no left ventricular hypertrophy. Left ventricular diastolic function could not be evaluated due to atrial fibrillation. Left ventricular diastolic function could not be evaluated. Right Ventricle: The right ventricular size is normal. No increase in right ventricular wall thickness. Right ventricular systolic function is normal. There is normal pulmonary artery systolic pressure. The tricuspid regurgitant velocity  is 2.46 m/s, and  with an assumed right atrial pressure of 8 mmHg, the estimated right ventricular systolic pressure is 50.9 mmHg. Left Atrium:  Left atrial size was normal in size. Right Atrium: Right atrial size was normal in size. Pericardium: There is no evidence of pericardial effusion. Mitral Valve: The mitral valve is normal in structure. Mild mitral valve regurgitation, with centrally-directed jet. Tricuspid Valve: The tricuspid valve is normal in structure. Tricuspid valve regurgitation is mild. Aortic Valve: The aortic valve is tricuspid. Aortic valve regurgitation is not visualized. Aortic valve sclerosis is present, with no evidence of aortic valve stenosis. Aortic valve peak gradient measures 6.2 mmHg. Pulmonic Valve: The pulmonic valve was normal in structure. Pulmonic valve regurgitation is not visualized. Aorta: The aortic root and ascending aorta are structurally normal, with no evidence of dilitation. Venous: The inferior vena cava is dilated in size with greater than 50% respiratory variability, suggesting right atrial pressure of 8 mmHg. IAS/Shunts: No atrial level shunt detected by color flow Doppler.  LEFT VENTRICLE PLAX 2D LVIDd:         3.90 cm   Diastology LVIDs:         2.20 cm   LV e' medial:    8.76 cm/s LV PW:         0.90 cm   LV E/e' medial:  11.8 LV IVS:        0.90 cm   LV e' lateral:   11.50 cm/s LVOT diam:     1.80 cm   LV E/e' lateral: 9.0 LV SV:         45 LV SV Index:   26 LVOT Area:     2.54 cm  RIGHT VENTRICLE             IVC RV Basal diam:  3.54 cm     IVC diam: 2.40 cm RV S prime:     11.50 cm/s TAPSE (M-mode): 1.5 cm LEFT ATRIUM             Index        RIGHT ATRIUM           Index LA diam:        3.10 cm 1.78 cm/m   RA Area:     10.90 cm LA Vol (A2C):   47.4 ml 27.17 ml/m  RA Volume:   21.10 ml  12.09 ml/m LA Vol (A4C):   39.1 ml 22.41 ml/m LA Biplane Vol: 44.5 ml 25.51 ml/m  AORTIC VALVE                 PULMONIC VALVE AV Area (Vmax): 2.18 cm     PV Vmax:       0.73 m/s AV Vmax:        125.00 cm/s  PV Peak grad:  2.1 mmHg AV Peak Grad:   6.2 mmHg LVOT Vmax:      107.00 cm/s LVOT Vmean:     65.700 cm/s LVOT  VTI:       0.178 m  AORTA Ao Root diam: 3.10 cm Ao Asc diam:  3.20 cm MITRAL VALVE                TRICUSPID VALVE MV Area (PHT): 3.46 cm     TR Peak grad:   24.2 mmHg MV Decel Time: 219 msec     TR Vmax:        246.00 cm/s MR Peak grad: 80.3 mmHg MR Vmax:  448.00 cm/s   SHUNTS MV E velocity: 103.00 cm/s  Systemic VTI:  0.18 m                             Systemic Diam: 1.80 cm Dani Gobble Croitoru MD Electronically signed by Sanda Klein MD Signature Date/Time: 07/30/2021/4:45:18 PM    Final       LOS: 3 days    Flora Lipps, MD [facility] 08/01/2021, 12:30 PM

## 2021-08-02 DIAGNOSIS — R5381 Other malaise: Secondary | ICD-10-CM

## 2021-08-02 DIAGNOSIS — E876 Hypokalemia: Secondary | ICD-10-CM

## 2021-08-02 DIAGNOSIS — I4891 Unspecified atrial fibrillation: Secondary | ICD-10-CM

## 2021-08-02 LAB — BASIC METABOLIC PANEL
Anion gap: 5 (ref 5–15)
BUN: 9 mg/dL (ref 8–23)
CO2: 30 mmol/L (ref 22–32)
Calcium: 8.4 mg/dL — ABNORMAL LOW (ref 8.9–10.3)
Chloride: 102 mmol/L (ref 98–111)
Creatinine, Ser: 0.57 mg/dL (ref 0.44–1.00)
GFR, Estimated: >60 mL/min (ref >60)
Glucose, Bld: 97 mg/dL (ref 70–99)
Potassium: 3.7 mmol/L (ref 3.5–5.1)
Sodium: 137 mmol/L (ref 135–145)

## 2021-08-02 LAB — CULTURE, BLOOD (ROUTINE X 2)
Culture: NO GROWTH
Special Requests: ADEQUATE

## 2021-08-02 LAB — CBC
HCT: 35.9 % — ABNORMAL LOW (ref 36.0–46.0)
Hemoglobin: 12 g/dL (ref 12.0–15.0)
MCH: 32 pg (ref 26.0–34.0)
MCHC: 33.4 g/dL (ref 30.0–36.0)
MCV: 95.7 fL (ref 80.0–100.0)
Platelets: 245 10*3/uL (ref 150–400)
RBC: 3.75 MIL/uL — ABNORMAL LOW (ref 3.87–5.11)
RDW: 14.3 % (ref 11.5–15.5)
WBC: 6.4 10*3/uL (ref 4.0–10.5)
nRBC: 0 % (ref 0.0–0.2)

## 2021-08-02 LAB — MAGNESIUM: Magnesium: 2 mg/dL (ref 1.7–2.4)

## 2021-08-02 LAB — PHOSPHORUS: Phosphorus: 2.5 mg/dL (ref 2.5–4.6)

## 2021-08-02 MED ORDER — ONDANSETRON HCL 4 MG PO TABS: 4.0000 mg | ORAL_TABLET | Freq: Four times a day (QID) | ORAL | 0 refills | Status: AC | PRN

## 2021-08-02 MED ORDER — DILTIAZEM HCL ER COATED BEADS 120 MG PO CP24: 120.0000 mg | ORAL_CAPSULE | Freq: Every day | ORAL | 2 refills | Status: AC

## 2021-08-02 MED ORDER — VANCOMYCIN HCL 125 MG PO CAPS: 125.0000 mg | ORAL_CAPSULE | Freq: Four times a day (QID) | ORAL | 0 refills | Status: DC

## 2021-08-02 MED ORDER — ASPIRIN 81 MG PO CHEW: 81.0000 mg | CHEWABLE_TABLET | Freq: Every day | ORAL | 2 refills | Status: AC

## 2021-08-02 MED ORDER — DILTIAZEM HCL ER COATED BEADS 120 MG PO CP24: 120.0000 mg | ORAL_CAPSULE | Freq: Every day | ORAL | 2 refills | Status: DC

## 2021-08-02 MED ORDER — VANCOMYCIN HCL 125 MG PO CAPS: 125.0000 mg | ORAL_CAPSULE | Freq: Four times a day (QID) | ORAL | 0 refills | Status: AC

## 2021-08-02 MED ORDER — POTASSIUM CHLORIDE CRYS ER 20 MEQ PO TBCR: 20.0000 meq | EXTENDED_RELEASE_TABLET | Freq: Every day | ORAL | 0 refills | Status: DC

## 2021-08-02 NOTE — Discharge Summary (Signed)
Physician Discharge Summary   Patient: Mallory Grant MRN: [id number] DOB: 1928/10/04  Admit date:     07/28/2021  Discharge date: 08/02/21  Discharge Physician: Flora Lipps   PCP: Shirline Frees, MD   Recommendations at discharge:   Complete the course of oral vancomycin total 10-day course.  Please try to avoid antibiotics in the future if not indicated. Encourage oral hydration and nutritious oral diet. Follow-up with your primary care physician in 1 week.  Check blood work at that time.  Continue to take medications as prescribed. Patient has atrial fibrillation and has declined anticoagulation.  Patient wishes to discuss this with her cardiologist/PCP.  Patient has however opted to be on an aspirin.  Discharge Diagnoses: Principal Problem:   Severe sepsis ([place]) Active Problems:   C. difficile diarrhea   Atrial fibrillation with rapid ventricular response (HCC)   Acute respiratory failure with hypoxia (HCC)   Hypothyroidism   Acute metabolic encephalopathy   Hyponatremia   Hypokalemia   Hypophosphatemia   Debility  Resolved Problems:   * No resolved hospital problems. [facility] Course: Patient is a 88 years old female with history of dementia, paroxysmal atrial fibrillation from memory care unit presented to hospital with fall, difficulty ambulating and altered mental status.  In the ED, patient was febrile with a leukocytosis and had some tachypnea.  Lactic acid was elevated and blood pressure slightly low.  Patient was then admitted to the hospital for possible sepsis with hypotension.  Stool analysis however showed C. difficile infection.  Antibiotics were initially started which was subsequently discontinued. Following conditions were addressed during hospitalization,  Assessment and Plan: * Severe sepsis ([place])- (present on admission) On presentation. Likely secondary to C. difficile infection.  On oral vancomycin at this time.  We will continue to complete 10-day  course.  COVID and flu neg. Urinalysis was negative.  Chest x-ray was negative for acute findings.  Blood cultures negative so far  C. difficile diarrhea- (present on admission) Continue oral vancomycin.  GI pathogen panel negative, fever has improved. Leukocytosis has normalized.  Has clinically significantly improved.  Atrial fibrillation with rapid ventricular response (HCC) Controlled at this time.  Patient does have a history of lone atrial fibrillation paroxysmal in nature.  On long-acting Cardizem at this time.  2D echocardiogram done to 07/30/21 showed LV ejection fraction of 65 to 70%, with out left atrial dilatation.  Has-bled score of 1, CHA2DS2-VASc score 3 or more.  Spoke with the patient's son regarding anticoagulation including risk versus benefits and he wishes to talk this with her primary care physician/cardiologist but is okay with aspirin alone.  He is aware of the risk of stroke without anticoagulation.  We will continue aspirin on discharge.  Acute respiratory failure with hypoxia ([place])- (present on admission) Currently on 2 L of oxygen.  Repeat chest x-ray on 07/30/2021 showed improving infiltrates but possible opacity in the left lung.  We will continue incentive spirometry, propped up position, supplemental oxygen.  Wean as able.  We will try to sit up in the chair and ambulate as possible  Debility Patient is from assisted living facility.  We will schedule for home health on discharge   Hypophosphatemia Received potassium phosphate IV on 07/31/2021.Marland Kitchen  Phosphate level of 2.5 at this time  Hypokalemia Improved after replacement.  Hyponatremia Likely secondary to volume depletion.  Sodium has normalized at 137 today.  Acute metabolic encephalopathy- (present on admission) Currently at baseline.  Patient does have a history  of chronic dementia.  Improved encephalopathy at this time..  Hypothyroidism- (present on admission) Continue Synthroid.    Consultants:  None Procedures performed: None Disposition: Assisted living Diet recommendation:  Discharge Diet Orders (From admission, onward)     Start     Ordered   08/02/21 0000  Diet general       Comments: Soft diet.   08/02/21 0851           Regular diet  DISCHARGE MEDICATION: Allergies as of 08/02/2021       Reactions   Diphenhydramine-zinc Acetate    Other reaction(s): Other (See Comments) Makes hyper   Procaine    Other reaction(s): Other (See Comments) Makes heart race    Benadryl [diphenhydramine Hcl] Other (See Comments)   hyperactivity   Codeine Nausea And Vomiting, Nausea Only   Novocain [procaine Hcl] Other (See Comments)   tachycardia   Sulfa Antibiotics Nausea And Vomiting, Nausea Only           Medication List     STOP taking these medications    cephALEXin 500 MG capsule Commonly known as: KEFLEX   polyethylene glycol 17 g packet Commonly known as: MIRALAX / GLYCOLAX       TAKE these medications    acetaminophen 325 MG tablet Commonly known as: TYLENOL Take by mouth every 6 (six) hours as needed for mild pain.   aspirin 81 MG chewable tablet Chew 1 tablet (81 mg total) by mouth daily.   diltiazem 120 MG 24 hr capsule Commonly known as: CARDIZEM CD Take 1 capsule (120 mg total) by mouth daily.   donepezil 10 MG tablet Commonly known as: ARICEPT Take 10 mg by mouth at bedtime.   levothyroxine 50 MCG tablet Commonly known as: SYNTHROID Take 50 mcg by mouth daily.   ondansetron 4 MG tablet Commonly known as: ZOFRAN Take 1 tablet (4 mg total) by mouth every 6 (six) hours as needed for nausea.   potassium chloride SA 20 MEQ tablet Commonly known as: KLOR-CON M Take 1 tablet (20 mEq total) by mouth daily for 5 days.   PreserVision AREDS 2 Caps Take 2 tablets by mouth daily.   sertraline 100 MG tablet Commonly known as: ZOLOFT Take 100 mg by mouth daily.   traZODone 100 MG tablet Commonly known as: DESYREL Take 100 mg by mouth at  bedtime.   vancomycin 125 MG capsule Commonly known as: VANCOCIN Take 1 capsule (125 mg total) by mouth 4 (four) times daily for 6 days. Ok with liquid preparation   VITAMIN D PO Take 2,000 Units by mouth daily.        Discharge Exam: Filed Weights   07/28/21 1440  Weight: 65.8 kg   Vitals with BMI 08/02/2021 08/01/2021 08/01/2021  Height - - -  Weight - - -  BMI - - -  Systolic 275 170 017  Diastolic 85 55 80  Pulse 77 81 -  General:  Average built, not in obvious distress HENT:   No scleral pallor or icterus noted. Oral mucosa is moist.  Chest:  Clear breath sounds.  Diminished breath sounds bilaterally. No crackles or wheezes.  CVS: S1 &S2 heard. No murmur.  Regular rate and rhythm. Abdomen: Soft, nontender, nondistended.  Bowel sounds are heard.   Extremities: No cyanosis, clubbing or edema.  Peripheral pulses are palpable. Psych: Alert, awake and communicative, normal mood CNS:  No cranial nerve deficits.  Power equal in all extremities.   Skin: Warm and dry.  No  rashes noted.   Condition at discharge: stable  The results of significant diagnostics from this hospitalization (including imaging, microbiology, ancillary and laboratory) are listed below for reference.   Imaging Studies: CT Head Wo Contrast  Result Date: 07/28/2021 CLINICAL DATA:  Status post fall.  Memory loss. EXAM: CT HEAD WITHOUT CONTRAST TECHNIQUE: Contiguous axial images were obtained from the base of the skull through the vertex without intravenous contrast. RADIATION DOSE REDUCTION: This exam was performed according to the departmental dose-optimization program which includes automated exposure control, adjustment of the mA and/or kV according to patient size and/or use of iterative reconstruction technique. COMPARISON:  06/22/2020 FINDINGS: Brain: No evidence of acute infarction, hemorrhage, extra-axial collection, ventriculomegaly, or mass effect. Generalized cerebral atrophy. Periventricular white  matter low attenuation likely secondary to microangiopathy. Vascular: Cerebrovascular atherosclerotic calcifications are noted. Skull: Negative for fracture or focal lesion. Sinuses/Orbits: Visualized portions of the orbits are unremarkable. Visualized portions of the paranasal sinuses are unremarkable. Visualized portions of the mastoid air cells are unremarkable. Other: None. IMPRESSION: 1. No acute intracranial abnormality. 2. Generalized cerebral atrophy and chronic microvascular disease. Electronically Signed   By: Kathreen Devoid M.D.   On: 07/28/2021 15:17   DG Chest 1V REPEAT Same Day  Result Date: 07/28/2021 CLINICAL DATA:  Increasing hypoxia. EXAM: CHEST - 1 VIEW SAME DAY COMPARISON:  07/28/2021. FINDINGS: The heart is stable in size and the mediastinal contour is within normal limits. Atherosclerotic calcification of the aorta is noted. The pulmonary vasculature is distended. Increased interstitial opacities are noted bilaterally with a perihilar predominance. No effusion or pneumothorax. No acute osseous abnormality IMPRESSION: 1. Distended pulmonary vasculature. 2. Diffuse interstitial thickening bilaterally with a perihilar predominance suggesting pulmonary edema or infiltrate. Electronically Signed   By: Brett Fairy M.D.   On: 07/28/2021 23:28   DG CHEST PORT 1 VIEW  Result Date: 07/30/2021 CLINICAL DATA:  Hypoxia. EXAM: PORTABLE CHEST 1 VIEW COMPARISON:  Radiographs 07/28/2021 FINDINGS: 1001 hours. The heart size and mediastinal contours are stable with aortic atherosclerosis. The diffuse pulmonary opacities seen on the most recent study have partially cleared, most consistent with resolving edema. There is residual asymmetric airspace disease in the left lower lobe with associated mild volume loss. There may be a small left pleural effusion. No evidence of pneumothorax. The bones appear unchanged. Telemetry leads overlie the chest. IMPRESSION: 1. Resolving diffuse pulmonary opacities consistent  with resolving edema. 2. Residual focal left lower lobe airspace disease with volume loss and a possible small left pleural effusion. This is suspicious for possible aspiration. Continued follow-up recommended. Electronically Signed   By: Richardean Sale M.D.   On: 07/30/2021 10:43   DG Chest Port 1 View  Result Date: 07/28/2021 CLINICAL DATA:  Chest pain EXAM: PORTABLE CHEST 1 VIEW COMPARISON:  None. FINDINGS: Heart size and mediastinal contours are within normal limits. Lungs are clear. No pleural effusion or pneumothorax is seen. Osseous structures about the chest are unremarkable. IMPRESSION: No acute findings. No evidence of pneumonia or pulmonary edema. No osseous fracture or dislocation is seen. Electronically Signed   By: Franki Cabot M.D.   On: 07/28/2021 15:17   ECHOCARDIOGRAM COMPLETE  Result Date: 07/30/2021    ECHOCARDIOGRAM REPORT   Patient Name:   Mallory Grant Date of Exam: 07/30/2021 Medical Rec #:  [id number]     Height:       66.0 in Accession #:    [id number]    Weight:       145.1 lb Date  of Birth:  Aug 26, 1928    BSA:          1.745 m Patient Age:    31 years      BP:           94/49 mmHg Patient Gender: F             HR:           87 bpm. Exam Location:  Inpatient Procedure: 2D Echo Indications:    Dyspnea  History:        Patient has prior history of Echocardiogram examinations, most                 recent 04/19/2014. Arrythmias:Atrial Fibrillation.  Sonographer:    Jefferey Pica Referring Phys: [id number] [place]  1. Left ventricular ejection fraction, by estimation, is 65 to 70%. The left ventricle has normal function. The left ventricle has no regional wall motion abnormalities. Left ventricular diastolic function could not be evaluated.  2. Right ventricular systolic function is normal. The right ventricular size is normal. There is normal pulmonary artery systolic pressure.  3. The mitral valve is normal in structure. Mild mitral valve regurgitation.  4. The aortic  valve is tricuspid. Aortic valve regurgitation is not visualized. Aortic valve sclerosis is present, with no evidence of aortic valve stenosis.  5. The inferior vena cava is dilated in size with >50% respiratory variability, suggesting right atrial pressure of 8 mmHg. Comparison(s): Prior images unable to be directly viewed, comparison made by report only. FINDINGS  Left Ventricle: Left ventricular ejection fraction, by estimation, is 65 to 70%. The left ventricle has normal function. The left ventricle has no regional wall motion abnormalities. The left ventricular internal cavity size was normal in size. There is  no left ventricular hypertrophy. Left ventricular diastolic function could not be evaluated due to atrial fibrillation. Left ventricular diastolic function could not be evaluated. Right Ventricle: The right ventricular size is normal. No increase in right ventricular wall thickness. Right ventricular systolic function is normal. There is normal pulmonary artery systolic pressure. The tricuspid regurgitant velocity is 2.46 m/s, and  with an assumed right atrial pressure of 8 mmHg, the estimated right ventricular systolic pressure is 43.3 mmHg. Left Atrium: Left atrial size was normal in size. Right Atrium: Right atrial size was normal in size. Pericardium: There is no evidence of pericardial effusion. Mitral Valve: The mitral valve is normal in structure. Mild mitral valve regurgitation, with centrally-directed jet. Tricuspid Valve: The tricuspid valve is normal in structure. Tricuspid valve regurgitation is mild. Aortic Valve: The aortic valve is tricuspid. Aortic valve regurgitation is not visualized. Aortic valve sclerosis is present, with no evidence of aortic valve stenosis. Aortic valve peak gradient measures 6.2 mmHg. Pulmonic Valve: The pulmonic valve was normal in structure. Pulmonic valve regurgitation is not visualized. Aorta: The aortic root and ascending aorta are structurally normal, with no  evidence of dilitation. Venous: The inferior vena cava is dilated in size with greater than 50% respiratory variability, suggesting right atrial pressure of 8 mmHg. IAS/Shunts: No atrial level shunt detected by color flow Doppler.  LEFT VENTRICLE PLAX 2D LVIDd:         3.90 cm   Diastology LVIDs:         2.20 cm   LV e' medial:    8.76 cm/s LV PW:         0.90 cm   LV E/e' medial:  11.8 LV IVS:  0.90 cm   LV e' lateral:   11.50 cm/s LVOT diam:     1.80 cm   LV E/e' lateral: 9.0 LV SV:         45 LV SV Index:   26 LVOT Area:     2.54 cm  RIGHT VENTRICLE             IVC RV Basal diam:  3.54 cm     IVC diam: 2.40 cm RV S prime:     11.50 cm/s TAPSE (M-mode): 1.5 cm LEFT ATRIUM             Index        RIGHT ATRIUM           Index LA diam:        3.10 cm 1.78 cm/m   RA Area:     10.90 cm LA Vol (A2C):   47.4 ml 27.17 ml/m  RA Volume:   21.10 ml  12.09 ml/m LA Vol (A4C):   39.1 ml 22.41 ml/m LA Biplane Vol: 44.5 ml 25.51 ml/m  AORTIC VALVE                 PULMONIC VALVE AV Area (Vmax): 2.18 cm     PV Vmax:       0.73 m/s AV Vmax:        125.00 cm/s  PV Peak grad:  2.1 mmHg AV Peak Grad:   6.2 mmHg LVOT Vmax:      107.00 cm/s LVOT Vmean:     65.700 cm/s LVOT VTI:       0.178 m  AORTA Ao Root diam: 3.10 cm Ao Asc diam:  3.20 cm MITRAL VALVE                TRICUSPID VALVE MV Area (PHT): 3.46 cm     TR Peak grad:   24.2 mmHg MV Decel Time: 219 msec     TR Vmax:        246.00 cm/s MR Peak grad: 80.3 mmHg MR Vmax:      448.00 cm/s   SHUNTS MV E velocity: 103.00 cm/s  Systemic VTI:  0.18 m                             Systemic Diam: 1.80 cm Dani Gobble Croitoru MD Electronically signed by Sanda Klein MD Signature Date/Time: 07/30/2021/4:45:18 PM    Final     Microbiology: Results for orders placed or performed during the hospital encounter of 07/28/21  Resp Panel by RT-PCR (Flu A&B, Covid) Nasopharyngeal Swab     Status: None   Collection Time: 07/28/21  3:31 PM   Specimen: Nasopharyngeal Swab; Nasopharyngeal(NP)  swabs in vial transport medium  Result Value Ref Range Status   SARS Coronavirus 2 by RT PCR NEGATIVE NEGATIVE Final    Comment: (NOTE) SARS-CoV-2 target nucleic acids are NOT DETECTED.  The SARS-CoV-2 RNA is generally detectable in upper respiratory specimens during the acute phase of infection. The lowest concentration of SARS-CoV-2 viral copies this assay can detect is 138 copies/mL. A negative result does not preclude SARS-Cov-2 infection and should not be used as the sole basis for treatment or other patient management decisions. A negative result may occur with  improper specimen collection/handling, submission of specimen other than nasopharyngeal swab, presence of viral mutation(s) within the areas targeted by this assay, and inadequate number of viral copies(<138 copies/mL). A negative result must be  combined with clinical observations, patient history, and epidemiological information. The expected result is Negative.  Fact Sheet for Patients:  EntrepreneurPulse.com.au  Fact Sheet for Healthcare Providers:  IncredibleEmployment.be  This test is no t yet approved or cleared by the [place] FDA and  has been authorized for detection and/or diagnosis of SARS-CoV-2 by FDA under an Emergency Use Authorization (EUA). This EUA will remain  in effect (meaning this test can be used) for the duration of the COVID-19 declaration under Section 564(b)(1) of the Act, 21 U.S.C.section 360bbb-3(b)(1), unless the authorization is terminated  or revoked sooner.       Influenza A by PCR NEGATIVE NEGATIVE Final   Influenza B by PCR NEGATIVE NEGATIVE Final    Comment: (NOTE) The Xpert Xpress SARS-CoV-2/FLU/RSV plus assay is intended as an aid in the diagnosis of influenza from Nasopharyngeal swab specimens and should not be used as a sole basis for treatment. Nasal washings and aspirates are unacceptable for Xpert Xpress  SARS-CoV-2/FLU/RSV testing.  Fact Sheet for Patients: EntrepreneurPulse.com.au  Fact Sheet for Healthcare Providers: IncredibleEmployment.be  This test is not yet approved or cleared by the [place] FDA and has been authorized for detection and/or diagnosis of SARS-CoV-2 by FDA under an Emergency Use Authorization (EUA). This EUA will remain in effect (meaning this test can be used) for the duration of the COVID-19 declaration under Section 564(b)(1) of the Act, 21 U.S.C. section 360bbb-3(b)(1), unless the authorization is terminated or revoked.  Performed at [facility], [address] [place], [place] [address]   Culture, blood (routine x 2)     Status: None (Preliminary result)   Collection Time: 07/28/21  7:00 PM   Specimen: BLOOD LEFT FOREARM  Result Value Ref Range Status   Specimen Description   Final    BLOOD LEFT FOREARM Performed at [facility], [place] [address], [place], [place] [address]    Special Requests   Final    Blood Culture adequate volume BOTTLES DRAWN AEROBIC AND ANAEROBIC Performed at [facility], [address] [place], [place] [address]    Culture   Final    NO GROWTH 4 DAYS Performed at [facility], [place] [address] [place], [place] [address]    Report Status PENDING  Incomplete  Culture, blood (routine x 2)     Status: None (Preliminary result)   Collection Time: 07/29/21 12:18 AM   Specimen: BLOOD  Result Value Ref Range Status   Specimen Description   Final    BLOOD SPECIMEN SOURCE NOT MARKED ON REQUISITION Performed at [facility] [address], [place], [place] [address]    Special Requests   Final    BOTTLES DRAWN AEROBIC ONLY Blood Culture adequate volume Performed at [facility] [address], [place], [place] [address]    Culture   Final    NO GROWTH 3 DAYS Performed at [facility], [place] [address],  [place], [place] [address]    Report Status PENDING  Incomplete  Gastrointestinal Panel by PCR , Stool     Status: None   Collection Time: 07/29/21  8:27 AM   Specimen: Stool  Result Value Ref Range Status   Campylobacter species NOT DETECTED NOT DETECTED Final   Plesimonas shigelloides NOT DETECTED NOT DETECTED Final   Salmonella species NOT DETECTED NOT DETECTED Final   Yersinia enterocolitica NOT DETECTED NOT DETECTED Final   Vibrio species NOT DETECTED NOT  DETECTED Final   Vibrio cholerae NOT DETECTED NOT DETECTED Final   Enteroaggregative E coli (EAEC) NOT DETECTED NOT DETECTED Final   Enteropathogenic E coli (EPEC) NOT DETECTED NOT DETECTED Final   Enterotoxigenic E coli (ETEC) NOT DETECTED NOT DETECTED Final   Shiga like toxin producing E coli (STEC) NOT DETECTED NOT DETECTED Final   Shigella/Enteroinvasive E coli (EIEC) NOT DETECTED NOT DETECTED Final   Cryptosporidium NOT DETECTED NOT DETECTED Final   Cyclospora cayetanensis NOT DETECTED NOT DETECTED Final   Entamoeba histolytica NOT DETECTED NOT DETECTED Final   Giardia lamblia NOT DETECTED NOT DETECTED Final   Adenovirus F40/41 NOT DETECTED NOT DETECTED Final   Astrovirus NOT DETECTED NOT DETECTED Final   Norovirus GI/GII NOT DETECTED NOT DETECTED Final   Rotavirus A NOT DETECTED NOT DETECTED Final   Sapovirus (I, II, IV, and V) NOT DETECTED NOT DETECTED Final    Comment: Performed at [facility], [address] [place], [place] [address]  C Difficile Quick Screen w PCR reflex     Status: Abnormal   Collection Time: 07/29/21  8:27 AM   Specimen: Stool  Result Value Ref Range Status   C Diff antigen POSITIVE (A) NEGATIVE Final   C Diff toxin POSITIVE (A) NEGATIVE Final   C Diff interpretation Toxin producing C. difficile detected.  Final    Comment: CRITICAL RESULT CALLED TO, READ BACK BY AND VERIFIED WITH: CALLENDER, J. RN @0952  ON 2.6.23 BY [facility] Performed at [facility] [address], [place], [place] [address]   MRSA Next Gen by PCR, Nasal     Status: None   Collection Time: 07/29/21  8:50 AM  Result Value Ref Range Status   MRSA by PCR Next Gen NOT DETECTED NOT DETECTED Final    Comment: (NOTE) The GeneXpert MRSA Assay (FDA approved for NASAL specimens only), is one component of a comprehensive MRSA colonization surveillance program. It is not intended to diagnose MRSA infection nor to guide or monitor treatment for MRSA infections. Test performance is not FDA approved in patients less than 45 years old. Performed at [facility], [place] [address], [place], [place] [address]     Labs: CBC: Recent Labs  Lab 07/28/21 1450 07/29/21 0018 07/30/21 0426 07/31/21 0404 08/01/21 0344 08/02/21 0355  WBC 24.4* 23.8* 19.8* 16.8* 9.6 6.4  NEUTROABS 21.6*  --   --   --   --   --   HGB 12.7 12.4 12.6 11.2* 11.3* 12.0  HCT 37.9 37.2 38.2 34.3* 34.4* 35.9*  MCV 94.5 96.9 95.7 97.2 96.4 95.7  PLT 239 201 187 179 205 427   Basic Metabolic Panel: Recent Labs  Lab 07/29/21 0018 07/30/21 0426 07/31/21 0404 08/01/21 0344 08/02/21 0355  NA 131* 130* 130* 136 137  K 3.3* 3.6 3.7 3.3* 3.7  CL 99 98 101 105 102  CO2 22 25 24 27 30   GLUCOSE 119* 118* 106* 102* 97  BUN 13 13 12 10 9   CREATININE 0.73 0.78 0.76 0.54 0.57  CALCIUM 8.5* 8.5* 8.2* 8.1* 8.4*  MG  --  1.8 2.0 2.0 2.0  PHOS  --   --  1.6*  --  2.5   Liver Function Tests: Recent Labs  Lab 07/28/21 1450 07/29/21 0018 07/30/21 0426 07/31/21 0404  AST 31 30 31 19   ALT 22 21 20 17   ALKPHOS 117 96 87 72  BILITOT 0.9 1.0 0.9 0.8  PROT 7.0 6.2* 5.8* 5.1*  ALBUMIN  3.7 3.3* 2.9* 2.4*   CBG: No results for input(s): GLUCAP in the last 168 hours.  Discharge time spent: greater than 30 minutes.  Signed: Flora Lipps, MD [facility] 08/02/2021

## 2021-08-02 NOTE — TOC Progression Note (Addendum)
Transition of Care [facility]) - Progression Note    Patient Details  Name: ABRI VACCA MRN: [id number] Date of Birth: 03-Jun-1929  [facility] CM/SW Contact  Purcell Mouton, RN Phone Number: 08/02/2021, 11:30 AM  Clinical Narrative:     TOC signing off. Spoke with pt's son who states that he will transport pt home.   Expected Discharge Plan: Assisted Living Barriers to Discharge: No Barriers Identified  Expected Discharge Plan and Services Expected Discharge Plan: Assisted Living   Discharge Planning Services: CM Consult Post Acute Care Choice: Nursing Home Living arrangements for the past 2 months: [place] Expected Discharge Date: 08/02/21                                     Social Determinants of Health (SDOH) Interventions    Readmission Risk Interventions No flowsheet data found.

## 2021-08-03 LAB — CULTURE, BLOOD (ROUTINE X 2)
Culture: NO GROWTH
Special Requests: ADEQUATE

## 2021-08-07 ENCOUNTER — Encounter: Payer: Self-pay | Admitting: *Deleted

## 2021-08-07 NOTE — [facility] (Signed)
Patient admitted from her alf due to abd pain and found to have c-diff.  Was treated with iv abx.  Infection cleared and she was released back top the alf.  Has family that is attentive and near by.  Has a strong church family.  No other needs found.

## 2021-08-08 DIAGNOSIS — K219 Gastro-esophageal reflux disease without esophagitis: Secondary | ICD-10-CM | POA: Diagnosis not present

## 2021-08-08 DIAGNOSIS — Z9181 History of falling: Secondary | ICD-10-CM | POA: Diagnosis not present

## 2021-08-08 DIAGNOSIS — E7800 Pure hypercholesterolemia, unspecified: Secondary | ICD-10-CM | POA: Diagnosis not present

## 2021-08-08 DIAGNOSIS — E781 Pure hyperglyceridemia: Secondary | ICD-10-CM | POA: Diagnosis not present

## 2021-08-08 DIAGNOSIS — M19031 Primary osteoarthritis, right wrist: Secondary | ICD-10-CM | POA: Diagnosis not present

## 2021-08-08 DIAGNOSIS — N1831 Chronic kidney disease, stage 3a: Secondary | ICD-10-CM | POA: Diagnosis not present

## 2021-08-08 DIAGNOSIS — M810 Age-related osteoporosis without current pathological fracture: Secondary | ICD-10-CM | POA: Diagnosis not present

## 2021-08-08 DIAGNOSIS — Z7982 Long term (current) use of aspirin: Secondary | ICD-10-CM | POA: Diagnosis not present

## 2021-08-08 DIAGNOSIS — F32A Depression, unspecified: Secondary | ICD-10-CM | POA: Diagnosis not present

## 2021-08-08 DIAGNOSIS — Z85828 Personal history of other malignant neoplasm of skin: Secondary | ICD-10-CM | POA: Diagnosis not present

## 2021-08-08 DIAGNOSIS — A498 Other bacterial infections of unspecified site: Secondary | ICD-10-CM | POA: Diagnosis not present

## 2021-08-08 DIAGNOSIS — G301 Alzheimer's disease with late onset: Secondary | ICD-10-CM | POA: Diagnosis not present

## 2021-08-08 DIAGNOSIS — H3530 Unspecified macular degeneration: Secondary | ICD-10-CM | POA: Diagnosis not present

## 2021-08-08 DIAGNOSIS — I480 Paroxysmal atrial fibrillation: Secondary | ICD-10-CM | POA: Diagnosis not present

## 2021-08-08 DIAGNOSIS — E039 Hypothyroidism, unspecified: Secondary | ICD-10-CM | POA: Diagnosis not present

## 2021-08-08 DIAGNOSIS — M19032 Primary osteoarthritis, left wrist: Secondary | ICD-10-CM | POA: Diagnosis not present

## 2021-08-09 DIAGNOSIS — E7800 Pure hypercholesterolemia, unspecified: Secondary | ICD-10-CM | POA: Diagnosis not present

## 2021-08-09 DIAGNOSIS — M810 Age-related osteoporosis without current pathological fracture: Secondary | ICD-10-CM | POA: Diagnosis not present

## 2021-08-09 DIAGNOSIS — E039 Hypothyroidism, unspecified: Secondary | ICD-10-CM | POA: Diagnosis not present

## 2021-08-09 DIAGNOSIS — N1831 Chronic kidney disease, stage 3a: Secondary | ICD-10-CM | POA: Diagnosis not present

## 2021-08-09 DIAGNOSIS — F32A Depression, unspecified: Secondary | ICD-10-CM | POA: Diagnosis not present

## 2021-08-09 DIAGNOSIS — I480 Paroxysmal atrial fibrillation: Secondary | ICD-10-CM | POA: Diagnosis not present

## 2021-08-09 DIAGNOSIS — Z7982 Long term (current) use of aspirin: Secondary | ICD-10-CM | POA: Diagnosis not present

## 2021-08-09 DIAGNOSIS — K219 Gastro-esophageal reflux disease without esophagitis: Secondary | ICD-10-CM | POA: Diagnosis not present

## 2021-08-09 DIAGNOSIS — Z9181 History of falling: Secondary | ICD-10-CM | POA: Diagnosis not present

## 2021-08-09 DIAGNOSIS — M19032 Primary osteoarthritis, left wrist: Secondary | ICD-10-CM | POA: Diagnosis not present

## 2021-08-09 DIAGNOSIS — G301 Alzheimer's disease with late onset: Secondary | ICD-10-CM | POA: Diagnosis not present

## 2021-08-09 DIAGNOSIS — M19031 Primary osteoarthritis, right wrist: Secondary | ICD-10-CM | POA: Diagnosis not present

## 2021-08-09 DIAGNOSIS — Z85828 Personal history of other malignant neoplasm of skin: Secondary | ICD-10-CM | POA: Diagnosis not present

## 2021-08-09 DIAGNOSIS — H3530 Unspecified macular degeneration: Secondary | ICD-10-CM | POA: Diagnosis not present

## 2021-08-12 DIAGNOSIS — Z9181 History of falling: Secondary | ICD-10-CM | POA: Diagnosis not present

## 2021-08-12 DIAGNOSIS — H3530 Unspecified macular degeneration: Secondary | ICD-10-CM | POA: Diagnosis not present

## 2021-08-12 DIAGNOSIS — F32A Depression, unspecified: Secondary | ICD-10-CM | POA: Diagnosis not present

## 2021-08-12 DIAGNOSIS — Z85828 Personal history of other malignant neoplasm of skin: Secondary | ICD-10-CM | POA: Diagnosis not present

## 2021-08-12 DIAGNOSIS — K219 Gastro-esophageal reflux disease without esophagitis: Secondary | ICD-10-CM | POA: Diagnosis not present

## 2021-08-12 DIAGNOSIS — I480 Paroxysmal atrial fibrillation: Secondary | ICD-10-CM | POA: Diagnosis not present

## 2021-08-12 DIAGNOSIS — G301 Alzheimer's disease with late onset: Secondary | ICD-10-CM | POA: Diagnosis not present

## 2021-08-12 DIAGNOSIS — N1831 Chronic kidney disease, stage 3a: Secondary | ICD-10-CM | POA: Diagnosis not present

## 2021-08-12 DIAGNOSIS — M19031 Primary osteoarthritis, right wrist: Secondary | ICD-10-CM | POA: Diagnosis not present

## 2021-08-12 DIAGNOSIS — E039 Hypothyroidism, unspecified: Secondary | ICD-10-CM | POA: Diagnosis not present

## 2021-08-12 DIAGNOSIS — M19032 Primary osteoarthritis, left wrist: Secondary | ICD-10-CM | POA: Diagnosis not present

## 2021-08-12 DIAGNOSIS — E7800 Pure hypercholesterolemia, unspecified: Secondary | ICD-10-CM | POA: Diagnosis not present

## 2021-08-12 DIAGNOSIS — Z7982 Long term (current) use of aspirin: Secondary | ICD-10-CM | POA: Diagnosis not present

## 2021-08-12 DIAGNOSIS — M810 Age-related osteoporosis without current pathological fracture: Secondary | ICD-10-CM | POA: Diagnosis not present

## 2021-08-15 ENCOUNTER — Other Ambulatory Visit: Payer: Self-pay

## 2021-08-15 ENCOUNTER — Encounter (INDEPENDENT_AMBULATORY_CARE_PROVIDER_SITE_OTHER): Payer: Medicare Other | Admitting: Ophthalmology

## 2021-08-15 DIAGNOSIS — H353231 Exudative age-related macular degeneration, bilateral, with active choroidal neovascularization: Secondary | ICD-10-CM

## 2021-08-15 DIAGNOSIS — H43813 Vitreous degeneration, bilateral: Secondary | ICD-10-CM

## 2021-08-16 DIAGNOSIS — G301 Alzheimer's disease with late onset: Secondary | ICD-10-CM | POA: Diagnosis not present

## 2021-08-16 DIAGNOSIS — H3530 Unspecified macular degeneration: Secondary | ICD-10-CM | POA: Diagnosis not present

## 2021-08-16 DIAGNOSIS — F32A Depression, unspecified: Secondary | ICD-10-CM | POA: Diagnosis not present

## 2021-08-16 DIAGNOSIS — M810 Age-related osteoporosis without current pathological fracture: Secondary | ICD-10-CM | POA: Diagnosis not present

## 2021-08-16 DIAGNOSIS — M19032 Primary osteoarthritis, left wrist: Secondary | ICD-10-CM | POA: Diagnosis not present

## 2021-08-16 DIAGNOSIS — E039 Hypothyroidism, unspecified: Secondary | ICD-10-CM | POA: Diagnosis not present

## 2021-08-16 DIAGNOSIS — E7800 Pure hypercholesterolemia, unspecified: Secondary | ICD-10-CM | POA: Diagnosis not present

## 2021-08-16 DIAGNOSIS — M19031 Primary osteoarthritis, right wrist: Secondary | ICD-10-CM | POA: Diagnosis not present

## 2021-08-16 DIAGNOSIS — N1831 Chronic kidney disease, stage 3a: Secondary | ICD-10-CM | POA: Diagnosis not present

## 2021-08-16 DIAGNOSIS — Z9181 History of falling: Secondary | ICD-10-CM | POA: Diagnosis not present

## 2021-08-16 DIAGNOSIS — Z7982 Long term (current) use of aspirin: Secondary | ICD-10-CM | POA: Diagnosis not present

## 2021-08-16 DIAGNOSIS — I480 Paroxysmal atrial fibrillation: Secondary | ICD-10-CM | POA: Diagnosis not present

## 2021-08-16 DIAGNOSIS — Z85828 Personal history of other malignant neoplasm of skin: Secondary | ICD-10-CM | POA: Diagnosis not present

## 2021-08-16 DIAGNOSIS — K219 Gastro-esophageal reflux disease without esophagitis: Secondary | ICD-10-CM | POA: Diagnosis not present

## 2021-08-17 DIAGNOSIS — E039 Hypothyroidism, unspecified: Secondary | ICD-10-CM | POA: Diagnosis not present

## 2021-08-17 DIAGNOSIS — Z7982 Long term (current) use of aspirin: Secondary | ICD-10-CM | POA: Diagnosis not present

## 2021-08-17 DIAGNOSIS — K219 Gastro-esophageal reflux disease without esophagitis: Secondary | ICD-10-CM | POA: Diagnosis not present

## 2021-08-17 DIAGNOSIS — N1831 Chronic kidney disease, stage 3a: Secondary | ICD-10-CM | POA: Diagnosis not present

## 2021-08-17 DIAGNOSIS — Z9181 History of falling: Secondary | ICD-10-CM | POA: Diagnosis not present

## 2021-08-17 DIAGNOSIS — F32A Depression, unspecified: Secondary | ICD-10-CM | POA: Diagnosis not present

## 2021-08-17 DIAGNOSIS — I480 Paroxysmal atrial fibrillation: Secondary | ICD-10-CM | POA: Diagnosis not present

## 2021-08-17 DIAGNOSIS — H3530 Unspecified macular degeneration: Secondary | ICD-10-CM | POA: Diagnosis not present

## 2021-08-17 DIAGNOSIS — M19031 Primary osteoarthritis, right wrist: Secondary | ICD-10-CM | POA: Diagnosis not present

## 2021-08-17 DIAGNOSIS — Z85828 Personal history of other malignant neoplasm of skin: Secondary | ICD-10-CM | POA: Diagnosis not present

## 2021-08-17 DIAGNOSIS — G301 Alzheimer's disease with late onset: Secondary | ICD-10-CM | POA: Diagnosis not present

## 2021-08-17 DIAGNOSIS — E7800 Pure hypercholesterolemia, unspecified: Secondary | ICD-10-CM | POA: Diagnosis not present

## 2021-08-17 DIAGNOSIS — M19032 Primary osteoarthritis, left wrist: Secondary | ICD-10-CM | POA: Diagnosis not present

## 2021-08-17 DIAGNOSIS — M810 Age-related osteoporosis without current pathological fracture: Secondary | ICD-10-CM | POA: Diagnosis not present

## 2021-08-21 DIAGNOSIS — M19031 Primary osteoarthritis, right wrist: Secondary | ICD-10-CM | POA: Diagnosis not present

## 2021-08-21 DIAGNOSIS — Z85828 Personal history of other malignant neoplasm of skin: Secondary | ICD-10-CM | POA: Diagnosis not present

## 2021-08-21 DIAGNOSIS — Z9181 History of falling: Secondary | ICD-10-CM | POA: Diagnosis not present

## 2021-08-21 DIAGNOSIS — E7800 Pure hypercholesterolemia, unspecified: Secondary | ICD-10-CM | POA: Diagnosis not present

## 2021-08-21 DIAGNOSIS — M19032 Primary osteoarthritis, left wrist: Secondary | ICD-10-CM | POA: Diagnosis not present

## 2021-08-21 DIAGNOSIS — K219 Gastro-esophageal reflux disease without esophagitis: Secondary | ICD-10-CM | POA: Diagnosis not present

## 2021-08-21 DIAGNOSIS — Z7982 Long term (current) use of aspirin: Secondary | ICD-10-CM | POA: Diagnosis not present

## 2021-08-21 DIAGNOSIS — N1831 Chronic kidney disease, stage 3a: Secondary | ICD-10-CM | POA: Diagnosis not present

## 2021-08-21 DIAGNOSIS — H3530 Unspecified macular degeneration: Secondary | ICD-10-CM | POA: Diagnosis not present

## 2021-08-21 DIAGNOSIS — E039 Hypothyroidism, unspecified: Secondary | ICD-10-CM | POA: Diagnosis not present

## 2021-08-21 DIAGNOSIS — M810 Age-related osteoporosis without current pathological fracture: Secondary | ICD-10-CM | POA: Diagnosis not present

## 2021-08-21 DIAGNOSIS — I480 Paroxysmal atrial fibrillation: Secondary | ICD-10-CM | POA: Diagnosis not present

## 2021-08-21 DIAGNOSIS — G301 Alzheimer's disease with late onset: Secondary | ICD-10-CM | POA: Diagnosis not present

## 2021-08-21 DIAGNOSIS — F32A Depression, unspecified: Secondary | ICD-10-CM | POA: Diagnosis not present

## 2021-08-23 DIAGNOSIS — I480 Paroxysmal atrial fibrillation: Secondary | ICD-10-CM | POA: Diagnosis not present

## 2021-08-23 DIAGNOSIS — M19031 Primary osteoarthritis, right wrist: Secondary | ICD-10-CM | POA: Diagnosis not present

## 2021-08-23 DIAGNOSIS — F32A Depression, unspecified: Secondary | ICD-10-CM | POA: Diagnosis not present

## 2021-08-23 DIAGNOSIS — N1831 Chronic kidney disease, stage 3a: Secondary | ICD-10-CM | POA: Diagnosis not present

## 2021-08-23 DIAGNOSIS — K219 Gastro-esophageal reflux disease without esophagitis: Secondary | ICD-10-CM | POA: Diagnosis not present

## 2021-08-23 DIAGNOSIS — Z7982 Long term (current) use of aspirin: Secondary | ICD-10-CM | POA: Diagnosis not present

## 2021-08-23 DIAGNOSIS — H3530 Unspecified macular degeneration: Secondary | ICD-10-CM | POA: Diagnosis not present

## 2021-08-23 DIAGNOSIS — Z85828 Personal history of other malignant neoplasm of skin: Secondary | ICD-10-CM | POA: Diagnosis not present

## 2021-08-23 DIAGNOSIS — G301 Alzheimer's disease with late onset: Secondary | ICD-10-CM | POA: Diagnosis not present

## 2021-08-23 DIAGNOSIS — M810 Age-related osteoporosis without current pathological fracture: Secondary | ICD-10-CM | POA: Diagnosis not present

## 2021-08-23 DIAGNOSIS — Z9181 History of falling: Secondary | ICD-10-CM | POA: Diagnosis not present

## 2021-08-23 DIAGNOSIS — M19032 Primary osteoarthritis, left wrist: Secondary | ICD-10-CM | POA: Diagnosis not present

## 2021-08-23 DIAGNOSIS — E039 Hypothyroidism, unspecified: Secondary | ICD-10-CM | POA: Diagnosis not present

## 2021-08-23 DIAGNOSIS — E7800 Pure hypercholesterolemia, unspecified: Secondary | ICD-10-CM | POA: Diagnosis not present

## 2021-08-27 DIAGNOSIS — M810 Age-related osteoporosis without current pathological fracture: Secondary | ICD-10-CM | POA: Diagnosis not present

## 2021-08-27 DIAGNOSIS — F32A Depression, unspecified: Secondary | ICD-10-CM | POA: Diagnosis not present

## 2021-08-27 DIAGNOSIS — Z9181 History of falling: Secondary | ICD-10-CM | POA: Diagnosis not present

## 2021-08-27 DIAGNOSIS — H3530 Unspecified macular degeneration: Secondary | ICD-10-CM | POA: Diagnosis not present

## 2021-08-27 DIAGNOSIS — M19031 Primary osteoarthritis, right wrist: Secondary | ICD-10-CM | POA: Diagnosis not present

## 2021-08-27 DIAGNOSIS — N1831 Chronic kidney disease, stage 3a: Secondary | ICD-10-CM | POA: Diagnosis not present

## 2021-08-27 DIAGNOSIS — I480 Paroxysmal atrial fibrillation: Secondary | ICD-10-CM | POA: Diagnosis not present

## 2021-08-27 DIAGNOSIS — G301 Alzheimer's disease with late onset: Secondary | ICD-10-CM | POA: Diagnosis not present

## 2021-08-27 DIAGNOSIS — M19032 Primary osteoarthritis, left wrist: Secondary | ICD-10-CM | POA: Diagnosis not present

## 2021-08-27 DIAGNOSIS — E039 Hypothyroidism, unspecified: Secondary | ICD-10-CM | POA: Diagnosis not present

## 2021-08-27 DIAGNOSIS — K219 Gastro-esophageal reflux disease without esophagitis: Secondary | ICD-10-CM | POA: Diagnosis not present

## 2021-08-27 DIAGNOSIS — Z7982 Long term (current) use of aspirin: Secondary | ICD-10-CM | POA: Diagnosis not present

## 2021-08-27 DIAGNOSIS — E7800 Pure hypercholesterolemia, unspecified: Secondary | ICD-10-CM | POA: Diagnosis not present

## 2021-08-27 DIAGNOSIS — Z85828 Personal history of other malignant neoplasm of skin: Secondary | ICD-10-CM | POA: Diagnosis not present

## 2021-08-30 DIAGNOSIS — I480 Paroxysmal atrial fibrillation: Secondary | ICD-10-CM | POA: Diagnosis not present

## 2021-08-30 DIAGNOSIS — Z7982 Long term (current) use of aspirin: Secondary | ICD-10-CM | POA: Diagnosis not present

## 2021-08-30 DIAGNOSIS — K219 Gastro-esophageal reflux disease without esophagitis: Secondary | ICD-10-CM | POA: Diagnosis not present

## 2021-08-30 DIAGNOSIS — M810 Age-related osteoporosis without current pathological fracture: Secondary | ICD-10-CM | POA: Diagnosis not present

## 2021-08-30 DIAGNOSIS — Z9181 History of falling: Secondary | ICD-10-CM | POA: Diagnosis not present

## 2021-08-30 DIAGNOSIS — N1831 Chronic kidney disease, stage 3a: Secondary | ICD-10-CM | POA: Diagnosis not present

## 2021-08-30 DIAGNOSIS — E039 Hypothyroidism, unspecified: Secondary | ICD-10-CM | POA: Diagnosis not present

## 2021-08-30 DIAGNOSIS — E7800 Pure hypercholesterolemia, unspecified: Secondary | ICD-10-CM | POA: Diagnosis not present

## 2021-08-30 DIAGNOSIS — F32A Depression, unspecified: Secondary | ICD-10-CM | POA: Diagnosis not present

## 2021-08-30 DIAGNOSIS — M19032 Primary osteoarthritis, left wrist: Secondary | ICD-10-CM | POA: Diagnosis not present

## 2021-08-30 DIAGNOSIS — M19031 Primary osteoarthritis, right wrist: Secondary | ICD-10-CM | POA: Diagnosis not present

## 2021-08-30 DIAGNOSIS — Z85828 Personal history of other malignant neoplasm of skin: Secondary | ICD-10-CM | POA: Diagnosis not present

## 2021-08-30 DIAGNOSIS — G301 Alzheimer's disease with late onset: Secondary | ICD-10-CM | POA: Diagnosis not present

## 2021-08-30 DIAGNOSIS — H3530 Unspecified macular degeneration: Secondary | ICD-10-CM | POA: Diagnosis not present

## 2021-09-02 ENCOUNTER — Other Ambulatory Visit: Payer: Self-pay

## 2021-09-02 ENCOUNTER — Encounter (HOSPITAL_BASED_OUTPATIENT_CLINIC_OR_DEPARTMENT_OTHER): Payer: Self-pay | Admitting: Emergency Medicine

## 2021-09-02 ENCOUNTER — Emergency Department (HOSPITAL_BASED_OUTPATIENT_CLINIC_OR_DEPARTMENT_OTHER): Payer: Medicare Other

## 2021-09-02 ENCOUNTER — Emergency Department (HOSPITAL_BASED_OUTPATIENT_CLINIC_OR_DEPARTMENT_OTHER)
Admission: EM | Admit: 2021-09-02 | Discharge: 2021-09-02 | Disposition: A | Discharge status: Home / Self Care | Payer: Medicare Other | Attending: Emergency Medicine | Admitting: Emergency Medicine

## 2021-09-02 DIAGNOSIS — M25512 Pain in left shoulder: Secondary | ICD-10-CM | POA: Diagnosis not present

## 2021-09-02 DIAGNOSIS — I480 Paroxysmal atrial fibrillation: Secondary | ICD-10-CM | POA: Diagnosis not present

## 2021-09-02 DIAGNOSIS — W19XXXA Unspecified fall, initial encounter: Secondary | ICD-10-CM | POA: Insufficient documentation

## 2021-09-02 DIAGNOSIS — N1831 Chronic kidney disease, stage 3a: Secondary | ICD-10-CM | POA: Diagnosis not present

## 2021-09-02 DIAGNOSIS — Z7982 Long term (current) use of aspirin: Secondary | ICD-10-CM | POA: Diagnosis not present

## 2021-09-02 DIAGNOSIS — M19032 Primary osteoarthritis, left wrist: Secondary | ICD-10-CM | POA: Diagnosis not present

## 2021-09-02 DIAGNOSIS — G301 Alzheimer's disease with late onset: Secondary | ICD-10-CM | POA: Diagnosis not present

## 2021-09-02 DIAGNOSIS — M810 Age-related osteoporosis without current pathological fracture: Secondary | ICD-10-CM | POA: Diagnosis not present

## 2021-09-02 DIAGNOSIS — G319 Degenerative disease of nervous system, unspecified: Secondary | ICD-10-CM | POA: Diagnosis not present

## 2021-09-02 DIAGNOSIS — Z85828 Personal history of other malignant neoplasm of skin: Secondary | ICD-10-CM | POA: Diagnosis not present

## 2021-09-02 DIAGNOSIS — F0390 Unspecified dementia without behavioral disturbance: Secondary | ICD-10-CM | POA: Diagnosis not present

## 2021-09-02 DIAGNOSIS — Z9181 History of falling: Secondary | ICD-10-CM | POA: Diagnosis not present

## 2021-09-02 DIAGNOSIS — M19031 Primary osteoarthritis, right wrist: Secondary | ICD-10-CM | POA: Diagnosis not present

## 2021-09-02 DIAGNOSIS — H3530 Unspecified macular degeneration: Secondary | ICD-10-CM | POA: Diagnosis not present

## 2021-09-02 DIAGNOSIS — F32A Depression, unspecified: Secondary | ICD-10-CM | POA: Diagnosis not present

## 2021-09-02 DIAGNOSIS — S0990XA Unspecified injury of head, initial encounter: Secondary | ICD-10-CM | POA: Diagnosis not present

## 2021-09-02 DIAGNOSIS — J90 Pleural effusion, not elsewhere classified: Secondary | ICD-10-CM | POA: Diagnosis not present

## 2021-09-02 DIAGNOSIS — E7800 Pure hypercholesterolemia, unspecified: Secondary | ICD-10-CM | POA: Diagnosis not present

## 2021-09-02 DIAGNOSIS — E039 Hypothyroidism, unspecified: Secondary | ICD-10-CM | POA: Diagnosis not present

## 2021-09-02 DIAGNOSIS — K219 Gastro-esophageal reflux disease without esophagitis: Secondary | ICD-10-CM | POA: Diagnosis not present

## 2021-09-02 NOTE — ED Triage Notes (Signed)
Patient brought in by son from [facility] assisted living after a fall about 1am. Patient does not remember fall- son states it is normal for patient to not remember details like this. Does not think she hit head. Pt was evaluated by EMS at facility. This morning still having left shoulder pain so son escorted patient to be further evaluated.  ?

## 2021-09-02 NOTE — ED Provider Notes (Signed)
?[place] EMERGENCY DEPARTMENT ?Provider Note ? ? ?CSN: [id number] ?Arrival date & time: 09/02/21  0707 ? ?  ? ?History ? ?Chief Complaint  ?Patient presents with  ? Fall  ? ? ?Mallory Grant is a 88 y.o. female w/ dementia presenting from assisted living with a fall.  Son at bedside provides history.  He reports that he was contacted by the facility the patient had an unwitnessed fall yesterday evening around 1 AM.  It was strongly suspected that she fell while attempting to transfer herself in the bathroom from the wheelchair to the toilet.  She has limited mobility at baseline.  She was found very soon after on the ground.  There is no report of visible head injury.  The patient denies a headache.  She was complaining of left shoulder pain yesterday evening and again this morning.  Her son decided to bring her in for evaluation of the shoulder pain. ? ?He also reports to me that she has been noted to have some borderline hypoxia in the past, has some baseline low oxygen levels typically 90-93%.  She has no known history of emphysema, smoking, or underlying lung disease. ? ?HPI ? ?  ? ?Home Medications ?Prior to Admission medications   ?Medication Sig Start Date End Date Taking? Authorizing Provider  ?acetaminophen (TYLENOL) 325 MG tablet Take by mouth every 6 (six) hours as needed for mild pain.    [provider]  ?aspirin 81 MG chewable tablet Chew 1 tablet (81 mg total) by mouth daily. 08/02/21   Pokhrel, Corrie Mckusick, MD  ?Cholecalciferol (VITAMIN D PO) Take 2,000 Units by mouth daily.    [provider]  ?diltiazem (CARDIZEM CD) 120 MG 24 hr capsule Take 1 capsule (120 mg total) by mouth daily. 08/02/21   Pokhrel, Corrie Mckusick, MD  ?donepezil (ARICEPT) 10 MG tablet Take 10 mg by mouth at bedtime.    [provider]  ?levothyroxine (SYNTHROID) 50 MCG tablet Take 50 mcg by mouth daily.    [provider]  ?Multiple Vitamins-Minerals (PRESERVISION AREDS 2) CAPS Take 2 tablets by  mouth daily.    [provider]  ?ondansetron (ZOFRAN) 4 MG tablet Take 1 tablet (4 mg total) by mouth every 6 (six) hours as needed for nausea. 08/02/21   Pokhrel, Corrie Mckusick, MD  ?potassium chloride SA (KLOR-CON M) 20 MEQ tablet Take 1 tablet (20 mEq total) by mouth daily for 5 days. 08/02/21 08/07/21  Pokhrel, Corrie Mckusick, MD  ?sertraline (ZOLOFT) 100 MG tablet Take 100 mg by mouth daily.    [provider]  ?traZODone (DESYREL) 100 MG tablet Take 100 mg by mouth at bedtime.    [provider]  ?   ? ?Allergies    ?Diphenhydramine-zinc acetate, Procaine, Benadryl [diphenhydramine hcl], Codeine, Novocain [procaine hcl], and Sulfa antibiotics   ? ?Review of Systems   ?Review of Systems ? ?Physical Exam ?Updated Vital Signs ?BP (!) 146/71 (BP Location: Right Arm)   Pulse 70   Temp 98 ?F (36.7 ?C) (Oral)   Resp 15   Ht '5\' 6"'$  (1.676 m)   Wt 65.8 kg   SpO2 94%   BMI 23.40 kg/m?  ?Physical Exam ?Constitutional:   ?   General: She is not in acute distress. ?HENT:  ?   Head: Normocephalic and atraumatic.  ?Eyes:  ?   Conjunctiva/sclera: Conjunctivae normal.  ?   Pupils: Pupils are equal, round, and reactive to light.  ?Cardiovascular:  ?   Rate and Rhythm: Normal  rate and regular rhythm.  ?Pulmonary:  ?   Effort: Pulmonary effort is normal. No respiratory distress.  ?Musculoskeletal:  ?   Comments: No clavicular tenderness or deformity ?Isolated tenderness of the left humeral head ?Full active and passive range of motion of the bilateral shoulders ?No spinal midline tenderness  ?Skin: ?   General: Skin is warm and dry.  ?Neurological:  ?   General: No focal deficit present.  ?   Mental Status: She is alert. Mental status is at baseline.  ? ? ?ED Results / Procedures / Treatments   ?Labs ?(all labs ordered are listed, but only abnormal results are displayed) ?Labs Reviewed - No data to display ? ?EKG ?None ? ?Radiology ?DG Chest 2 View ? ?Result Date: 09/02/2021 ?CLINICAL DATA:  Fell onto shoulder  yesterday EXAM: CHEST - 2 VIEW COMPARISON:  07/30/2021 FINDINGS: Artifact overlies the chest. Mediastinal shadows are normal. There are small effusions in the posterior costophrenic angles. The lungs are otherwise clear. No acute bone finding in the region. IMPRESSION: Small effusions in the posterior costophrenic angles. Otherwise negative. Electronically Signed   By: Nelson Chimes M.D.   On: 09/02/2021 08:10  ? ?CT Head Wo Contrast ? ?Result Date: 09/02/2021 ?CLINICAL DATA:  Head trauma, minor (Age >= 65y) EXAM: CT HEAD WITHOUT CONTRAST TECHNIQUE: Contiguous axial images were obtained from the base of the skull through the vertex without intravenous contrast. RADIATION DOSE REDUCTION: This exam was performed according to the departmental dose-optimization program which includes automated exposure control, adjustment of the mA and/or kV according to patient size and/or use of iterative reconstruction technique. COMPARISON:  July 28, 2021. FINDINGS: Brain: No evidence of acute infarction, hemorrhage, hydrocephalus, extra-axial collection or mass lesion/mass effect. Patchy white matter hypoattenuation, nonspecific but compatible with chronic microvascular ischemic disease. Cerebral atrophy. Vascular: No hyperdense vessel identified. Calcific intracranial atherosclerosis. Skull: No acute fracture. Sinuses/Orbits: Clear sinuses.  No acute orbital findings. Other: No mastoid effusions. IMPRESSION: 1. No evidence of acute intracranial abnormality. 2. Chronic microvascular disease. 3.  Cerebral atrophy (ICD10-G31.9). Electronically Signed   By: Margaretha Sheffield M.D.   On: 09/02/2021 08:02  ? ?DG Shoulder Left ? ?Result Date: 09/02/2021 ?CLINICAL DATA:  [place] yesterday with shoulder pain. EXAM: LEFT SHOULDER - 2+ VIEW COMPARISON:  None. FINDINGS: Glenohumeral joint is normal. AC joint is within normal limits. Tiny calcifications associated with the distal rotator cuff could indicate chronic calcific tendinopathy or minimal  avulsion fractures. IMPRESSION: No evidence of dislocation or major fracture. Tiny calcifications associated with the distal rotator cuff could be seen in chronic tendinopathy or indicate minimal supraspinatus tendon avulsion fractures. Electronically Signed   By: Nelson Chimes M.D.   On: 09/02/2021 08:08   ? ?Procedures ?Procedures  ? ? ?Medications Ordered in ED ?Medications - No data to display ? ?ED Course/ Medical Decision Making/ A&P ?  ?                        ?Medical Decision Making ?Amount and/or Complexity of Data Reviewed ?Radiology: ordered. ? ? ?Patient is here with a suspected mechanical fall at her assisted living facility yesterday evening.  Her only complaint is isolated left humeral head tenderness on exam but has full range of motion and is otherwise neurovascularly intact.  I personally ordered and obtained imaging of the shoulder and chest, notable for no evident fracture, small, suspected chronic pleural effusion ? ?Because of her dementia and unclear history whether there may have  been head trauma, I have a low threshold for ordering CT scan of the brain to rule out intracranial injury or hemorrhage, and her son was in agreement with this.  The CT scan per my personal review showed no acute intracranial injury ? ?I otherwise have a low suspicion for acute medical emergent process. ?She has chronic known borderline hypoxia, no new oxygen requirements and in no respiratory distress at this time.  Do not see evidence of pneumonia or other life-threatening illness. ? ?Supplemental history is provided by the patient's son. ?Patient's history and exam is limited by her dementia. ? ? ? ? ? ? ? ?Final Clinical Impression(s) / ED Diagnoses ?Final diagnoses:  ?Fall, initial encounter  ?Acute pain of left shoulder  ? ? ?Rx / DC Orders ?ED Discharge Orders   ? ? None  ? ?  ? ? ?  ?Wyvonnia Dusky, MD ?09/02/21 [phone] ? ?

## 2021-09-02 NOTE — Discharge Instructions (Signed)
Mallory Grant's xrays of the shoulder and chest did not show any obvious fractures or surgical emergency.  She may still have a small or chip fracture or muscle injury in her shoulder.  Therefore I recommend that she continue to receive assistance with all transfers.  She will need to be cautioned that she may not be able to support herself as she normally does with a walker or cane if she is having bad pain in her shoulder.  I would recommend Tylenol be given regularly every 6 (650 mg) for the next 5 days for her pain. ? ?We did obtain a CT scan of her brain which did not show any signs of a brain bleed or injury to the brain or skull. ?

## 2021-09-05 DIAGNOSIS — Z9181 History of falling: Secondary | ICD-10-CM | POA: Diagnosis not present

## 2021-09-05 DIAGNOSIS — Z7982 Long term (current) use of aspirin: Secondary | ICD-10-CM | POA: Diagnosis not present

## 2021-09-05 DIAGNOSIS — K219 Gastro-esophageal reflux disease without esophagitis: Secondary | ICD-10-CM | POA: Diagnosis not present

## 2021-09-05 DIAGNOSIS — I480 Paroxysmal atrial fibrillation: Secondary | ICD-10-CM | POA: Diagnosis not present

## 2021-09-05 DIAGNOSIS — M19032 Primary osteoarthritis, left wrist: Secondary | ICD-10-CM | POA: Diagnosis not present

## 2021-09-05 DIAGNOSIS — E7800 Pure hypercholesterolemia, unspecified: Secondary | ICD-10-CM | POA: Diagnosis not present

## 2021-09-05 DIAGNOSIS — F32A Depression, unspecified: Secondary | ICD-10-CM | POA: Diagnosis not present

## 2021-09-05 DIAGNOSIS — N1831 Chronic kidney disease, stage 3a: Secondary | ICD-10-CM | POA: Diagnosis not present

## 2021-09-05 DIAGNOSIS — E039 Hypothyroidism, unspecified: Secondary | ICD-10-CM | POA: Diagnosis not present

## 2021-09-05 DIAGNOSIS — G301 Alzheimer's disease with late onset: Secondary | ICD-10-CM | POA: Diagnosis not present

## 2021-09-05 DIAGNOSIS — M810 Age-related osteoporosis without current pathological fracture: Secondary | ICD-10-CM | POA: Diagnosis not present

## 2021-09-05 DIAGNOSIS — Z85828 Personal history of other malignant neoplasm of skin: Secondary | ICD-10-CM | POA: Diagnosis not present

## 2021-09-05 DIAGNOSIS — M19031 Primary osteoarthritis, right wrist: Secondary | ICD-10-CM | POA: Diagnosis not present

## 2021-09-05 DIAGNOSIS — H3530 Unspecified macular degeneration: Secondary | ICD-10-CM | POA: Diagnosis not present

## 2021-09-06 DIAGNOSIS — M25562 Pain in left knee: Secondary | ICD-10-CM | POA: Diagnosis not present

## 2021-09-11 DIAGNOSIS — M19031 Primary osteoarthritis, right wrist: Secondary | ICD-10-CM | POA: Diagnosis not present

## 2021-09-11 DIAGNOSIS — K219 Gastro-esophageal reflux disease without esophagitis: Secondary | ICD-10-CM | POA: Diagnosis not present

## 2021-09-11 DIAGNOSIS — M810 Age-related osteoporosis without current pathological fracture: Secondary | ICD-10-CM | POA: Diagnosis not present

## 2021-09-11 DIAGNOSIS — F32A Depression, unspecified: Secondary | ICD-10-CM | POA: Diagnosis not present

## 2021-09-11 DIAGNOSIS — M19032 Primary osteoarthritis, left wrist: Secondary | ICD-10-CM | POA: Diagnosis not present

## 2021-09-11 DIAGNOSIS — E7800 Pure hypercholesterolemia, unspecified: Secondary | ICD-10-CM | POA: Diagnosis not present

## 2021-09-11 DIAGNOSIS — E039 Hypothyroidism, unspecified: Secondary | ICD-10-CM | POA: Diagnosis not present

## 2021-09-11 DIAGNOSIS — N1831 Chronic kidney disease, stage 3a: Secondary | ICD-10-CM | POA: Diagnosis not present

## 2021-09-11 DIAGNOSIS — H3530 Unspecified macular degeneration: Secondary | ICD-10-CM | POA: Diagnosis not present

## 2021-09-11 DIAGNOSIS — Z7982 Long term (current) use of aspirin: Secondary | ICD-10-CM | POA: Diagnosis not present

## 2021-09-11 DIAGNOSIS — I480 Paroxysmal atrial fibrillation: Secondary | ICD-10-CM | POA: Diagnosis not present

## 2021-09-11 DIAGNOSIS — Z85828 Personal history of other malignant neoplasm of skin: Secondary | ICD-10-CM | POA: Diagnosis not present

## 2021-09-11 DIAGNOSIS — Z9181 History of falling: Secondary | ICD-10-CM | POA: Diagnosis not present

## 2021-09-11 DIAGNOSIS — G301 Alzheimer's disease with late onset: Secondary | ICD-10-CM | POA: Diagnosis not present

## 2021-09-12 DIAGNOSIS — M19031 Primary osteoarthritis, right wrist: Secondary | ICD-10-CM | POA: Diagnosis not present

## 2021-09-12 DIAGNOSIS — M19032 Primary osteoarthritis, left wrist: Secondary | ICD-10-CM | POA: Diagnosis not present

## 2021-09-12 DIAGNOSIS — F32A Depression, unspecified: Secondary | ICD-10-CM | POA: Diagnosis not present

## 2021-09-12 DIAGNOSIS — I480 Paroxysmal atrial fibrillation: Secondary | ICD-10-CM | POA: Diagnosis not present

## 2021-09-12 DIAGNOSIS — Z7982 Long term (current) use of aspirin: Secondary | ICD-10-CM | POA: Diagnosis not present

## 2021-09-12 DIAGNOSIS — E039 Hypothyroidism, unspecified: Secondary | ICD-10-CM | POA: Diagnosis not present

## 2021-09-12 DIAGNOSIS — N1831 Chronic kidney disease, stage 3a: Secondary | ICD-10-CM | POA: Diagnosis not present

## 2021-09-12 DIAGNOSIS — M810 Age-related osteoporosis without current pathological fracture: Secondary | ICD-10-CM | POA: Diagnosis not present

## 2021-09-12 DIAGNOSIS — H3530 Unspecified macular degeneration: Secondary | ICD-10-CM | POA: Diagnosis not present

## 2021-09-12 DIAGNOSIS — Z9181 History of falling: Secondary | ICD-10-CM | POA: Diagnosis not present

## 2021-09-12 DIAGNOSIS — Z85828 Personal history of other malignant neoplasm of skin: Secondary | ICD-10-CM | POA: Diagnosis not present

## 2021-09-12 DIAGNOSIS — G301 Alzheimer's disease with late onset: Secondary | ICD-10-CM | POA: Diagnosis not present

## 2021-09-12 DIAGNOSIS — E7800 Pure hypercholesterolemia, unspecified: Secondary | ICD-10-CM | POA: Diagnosis not present

## 2021-09-12 DIAGNOSIS — K219 Gastro-esophageal reflux disease without esophagitis: Secondary | ICD-10-CM | POA: Diagnosis not present

## 2021-09-17 DIAGNOSIS — E039 Hypothyroidism, unspecified: Secondary | ICD-10-CM | POA: Diagnosis not present

## 2021-09-17 DIAGNOSIS — H3530 Unspecified macular degeneration: Secondary | ICD-10-CM | POA: Diagnosis not present

## 2021-09-17 DIAGNOSIS — Z85828 Personal history of other malignant neoplasm of skin: Secondary | ICD-10-CM | POA: Diagnosis not present

## 2021-09-17 DIAGNOSIS — M19032 Primary osteoarthritis, left wrist: Secondary | ICD-10-CM | POA: Diagnosis not present

## 2021-09-17 DIAGNOSIS — N1831 Chronic kidney disease, stage 3a: Secondary | ICD-10-CM | POA: Diagnosis not present

## 2021-09-17 DIAGNOSIS — K219 Gastro-esophageal reflux disease without esophagitis: Secondary | ICD-10-CM | POA: Diagnosis not present

## 2021-09-17 DIAGNOSIS — M19031 Primary osteoarthritis, right wrist: Secondary | ICD-10-CM | POA: Diagnosis not present

## 2021-09-17 DIAGNOSIS — F32A Depression, unspecified: Secondary | ICD-10-CM | POA: Diagnosis not present

## 2021-09-17 DIAGNOSIS — G301 Alzheimer's disease with late onset: Secondary | ICD-10-CM | POA: Diagnosis not present

## 2021-09-17 DIAGNOSIS — Z9181 History of falling: Secondary | ICD-10-CM | POA: Diagnosis not present

## 2021-09-17 DIAGNOSIS — Z7982 Long term (current) use of aspirin: Secondary | ICD-10-CM | POA: Diagnosis not present

## 2021-09-17 DIAGNOSIS — E7800 Pure hypercholesterolemia, unspecified: Secondary | ICD-10-CM | POA: Diagnosis not present

## 2021-09-17 DIAGNOSIS — I480 Paroxysmal atrial fibrillation: Secondary | ICD-10-CM | POA: Diagnosis not present

## 2021-09-17 DIAGNOSIS — M810 Age-related osteoporosis without current pathological fracture: Secondary | ICD-10-CM | POA: Diagnosis not present

## 2021-10-23 ENCOUNTER — Encounter (INDEPENDENT_AMBULATORY_CARE_PROVIDER_SITE_OTHER): Payer: Medicare Other | Admitting: Ophthalmology

## 2021-10-23 DIAGNOSIS — H353231 Exudative age-related macular degeneration, bilateral, with active choroidal neovascularization: Secondary | ICD-10-CM

## 2021-10-23 DIAGNOSIS — H43813 Vitreous degeneration, bilateral: Secondary | ICD-10-CM | POA: Diagnosis not present

## 2021-10-23 DIAGNOSIS — H33301 Unspecified retinal break, right eye: Secondary | ICD-10-CM

## 2021-10-23 DIAGNOSIS — I10 Essential (primary) hypertension: Secondary | ICD-10-CM

## 2021-10-23 DIAGNOSIS — H35033 Hypertensive retinopathy, bilateral: Secondary | ICD-10-CM

## 2022-01-01 ENCOUNTER — Encounter (INDEPENDENT_AMBULATORY_CARE_PROVIDER_SITE_OTHER): Payer: Medicare Other | Admitting: Ophthalmology

## 2022-01-01 DIAGNOSIS — H43813 Vitreous degeneration, bilateral: Secondary | ICD-10-CM | POA: Diagnosis not present

## 2022-01-01 DIAGNOSIS — H33301 Unspecified retinal break, right eye: Secondary | ICD-10-CM | POA: Diagnosis not present

## 2022-01-01 DIAGNOSIS — H31003 Unspecified chorioretinal scars, bilateral: Secondary | ICD-10-CM

## 2022-01-01 DIAGNOSIS — H353231 Exudative age-related macular degeneration, bilateral, with active choroidal neovascularization: Secondary | ICD-10-CM | POA: Diagnosis not present

## 2022-01-03 DIAGNOSIS — K5904 Chronic idiopathic constipation: Secondary | ICD-10-CM | POA: Diagnosis not present

## 2022-01-03 DIAGNOSIS — G301 Alzheimer's disease with late onset: Secondary | ICD-10-CM | POA: Diagnosis not present

## 2022-01-03 DIAGNOSIS — E7800 Pure hypercholesterolemia, unspecified: Secondary | ICD-10-CM | POA: Diagnosis not present

## 2022-01-03 DIAGNOSIS — F5101 Primary insomnia: Secondary | ICD-10-CM | POA: Diagnosis not present

## 2022-01-03 DIAGNOSIS — E039 Hypothyroidism, unspecified: Secondary | ICD-10-CM | POA: Diagnosis not present

## 2022-03-12 ENCOUNTER — Encounter (INDEPENDENT_AMBULATORY_CARE_PROVIDER_SITE_OTHER): Payer: Medicare Other | Admitting: Ophthalmology

## 2022-03-12 DIAGNOSIS — H353231 Exudative age-related macular degeneration, bilateral, with active choroidal neovascularization: Secondary | ICD-10-CM | POA: Diagnosis not present

## 2022-03-12 DIAGNOSIS — H33301 Unspecified retinal break, right eye: Secondary | ICD-10-CM | POA: Diagnosis not present

## 2022-03-12 DIAGNOSIS — H35373 Puckering of macula, bilateral: Secondary | ICD-10-CM

## 2022-03-12 DIAGNOSIS — H43813 Vitreous degeneration, bilateral: Secondary | ICD-10-CM | POA: Diagnosis not present

## 2022-04-15 DIAGNOSIS — N390 Urinary tract infection, site not specified: Secondary | ICD-10-CM | POA: Diagnosis not present

## 2022-04-24 DIAGNOSIS — R238 Other skin changes: Secondary | ICD-10-CM | POA: Diagnosis not present

## 2022-04-24 DIAGNOSIS — R2689 Other abnormalities of gait and mobility: Secondary | ICD-10-CM | POA: Diagnosis not present

## 2022-04-24 DIAGNOSIS — G301 Alzheimer's disease with late onset: Secondary | ICD-10-CM | POA: Diagnosis not present

## 2022-04-26 DIAGNOSIS — F32A Depression, unspecified: Secondary | ICD-10-CM | POA: Diagnosis not present

## 2022-04-26 DIAGNOSIS — I129 Hypertensive chronic kidney disease with stage 1 through stage 4 chronic kidney disease, or unspecified chronic kidney disease: Secondary | ICD-10-CM | POA: Diagnosis not present

## 2022-04-26 DIAGNOSIS — E039 Hypothyroidism, unspecified: Secondary | ICD-10-CM | POA: Diagnosis not present

## 2022-04-26 DIAGNOSIS — Z9181 History of falling: Secondary | ICD-10-CM | POA: Diagnosis not present

## 2022-04-26 DIAGNOSIS — Z7982 Long term (current) use of aspirin: Secondary | ICD-10-CM | POA: Diagnosis not present

## 2022-04-26 DIAGNOSIS — N189 Chronic kidney disease, unspecified: Secondary | ICD-10-CM | POA: Diagnosis not present

## 2022-04-26 DIAGNOSIS — I4891 Unspecified atrial fibrillation: Secondary | ICD-10-CM | POA: Diagnosis not present

## 2022-04-26 DIAGNOSIS — G4700 Insomnia, unspecified: Secondary | ICD-10-CM | POA: Diagnosis not present

## 2022-04-26 DIAGNOSIS — G301 Alzheimer's disease with late onset: Secondary | ICD-10-CM | POA: Diagnosis not present

## 2022-04-26 DIAGNOSIS — F0283 Dementia in other diseases classified elsewhere, unspecified severity, with mood disturbance: Secondary | ICD-10-CM | POA: Diagnosis not present

## 2022-04-26 DIAGNOSIS — H3530 Unspecified macular degeneration: Secondary | ICD-10-CM | POA: Diagnosis not present

## 2022-04-26 DIAGNOSIS — M810 Age-related osteoporosis without current pathological fracture: Secondary | ICD-10-CM | POA: Diagnosis not present

## 2022-04-28 DIAGNOSIS — G301 Alzheimer's disease with late onset: Secondary | ICD-10-CM | POA: Diagnosis not present

## 2022-04-28 DIAGNOSIS — F32A Depression, unspecified: Secondary | ICD-10-CM | POA: Diagnosis not present

## 2022-04-28 DIAGNOSIS — I4891 Unspecified atrial fibrillation: Secondary | ICD-10-CM | POA: Diagnosis not present

## 2022-04-28 DIAGNOSIS — I129 Hypertensive chronic kidney disease with stage 1 through stage 4 chronic kidney disease, or unspecified chronic kidney disease: Secondary | ICD-10-CM | POA: Diagnosis not present

## 2022-04-28 DIAGNOSIS — F0283 Dementia in other diseases classified elsewhere, unspecified severity, with mood disturbance: Secondary | ICD-10-CM | POA: Diagnosis not present

## 2022-04-29 DIAGNOSIS — I129 Hypertensive chronic kidney disease with stage 1 through stage 4 chronic kidney disease, or unspecified chronic kidney disease: Secondary | ICD-10-CM | POA: Diagnosis not present

## 2022-04-29 DIAGNOSIS — F32A Depression, unspecified: Secondary | ICD-10-CM | POA: Diagnosis not present

## 2022-04-29 DIAGNOSIS — F0283 Dementia in other diseases classified elsewhere, unspecified severity, with mood disturbance: Secondary | ICD-10-CM | POA: Diagnosis not present

## 2022-04-29 DIAGNOSIS — G301 Alzheimer's disease with late onset: Secondary | ICD-10-CM | POA: Diagnosis not present

## 2022-04-29 DIAGNOSIS — I4891 Unspecified atrial fibrillation: Secondary | ICD-10-CM | POA: Diagnosis not present

## 2022-04-30 ENCOUNTER — Encounter (INDEPENDENT_AMBULATORY_CARE_PROVIDER_SITE_OTHER): Payer: Medicare Other | Admitting: Ophthalmology

## 2022-04-30 DIAGNOSIS — H353231 Exudative age-related macular degeneration, bilateral, with active choroidal neovascularization: Secondary | ICD-10-CM | POA: Diagnosis not present

## 2022-04-30 DIAGNOSIS — H43813 Vitreous degeneration, bilateral: Secondary | ICD-10-CM

## 2022-04-30 DIAGNOSIS — H33301 Unspecified retinal break, right eye: Secondary | ICD-10-CM

## 2022-05-02 DIAGNOSIS — G301 Alzheimer's disease with late onset: Secondary | ICD-10-CM | POA: Diagnosis not present

## 2022-05-02 DIAGNOSIS — F32A Depression, unspecified: Secondary | ICD-10-CM | POA: Diagnosis not present

## 2022-05-02 DIAGNOSIS — I129 Hypertensive chronic kidney disease with stage 1 through stage 4 chronic kidney disease, or unspecified chronic kidney disease: Secondary | ICD-10-CM | POA: Diagnosis not present

## 2022-05-02 DIAGNOSIS — F0283 Dementia in other diseases classified elsewhere, unspecified severity, with mood disturbance: Secondary | ICD-10-CM | POA: Diagnosis not present

## 2022-05-02 DIAGNOSIS — I4891 Unspecified atrial fibrillation: Secondary | ICD-10-CM | POA: Diagnosis not present

## 2022-05-05 DIAGNOSIS — G301 Alzheimer's disease with late onset: Secondary | ICD-10-CM | POA: Diagnosis not present

## 2022-05-05 DIAGNOSIS — I129 Hypertensive chronic kidney disease with stage 1 through stage 4 chronic kidney disease, or unspecified chronic kidney disease: Secondary | ICD-10-CM | POA: Diagnosis not present

## 2022-05-05 DIAGNOSIS — F0283 Dementia in other diseases classified elsewhere, unspecified severity, with mood disturbance: Secondary | ICD-10-CM | POA: Diagnosis not present

## 2022-05-05 DIAGNOSIS — F32A Depression, unspecified: Secondary | ICD-10-CM | POA: Diagnosis not present

## 2022-05-05 DIAGNOSIS — I4891 Unspecified atrial fibrillation: Secondary | ICD-10-CM | POA: Diagnosis not present

## 2022-05-08 DIAGNOSIS — I129 Hypertensive chronic kidney disease with stage 1 through stage 4 chronic kidney disease, or unspecified chronic kidney disease: Secondary | ICD-10-CM | POA: Diagnosis not present

## 2022-05-08 DIAGNOSIS — I4891 Unspecified atrial fibrillation: Secondary | ICD-10-CM | POA: Diagnosis not present

## 2022-05-08 DIAGNOSIS — F0283 Dementia in other diseases classified elsewhere, unspecified severity, with mood disturbance: Secondary | ICD-10-CM | POA: Diagnosis not present

## 2022-05-08 DIAGNOSIS — G301 Alzheimer's disease with late onset: Secondary | ICD-10-CM | POA: Diagnosis not present

## 2022-05-08 DIAGNOSIS — F32A Depression, unspecified: Secondary | ICD-10-CM | POA: Diagnosis not present

## 2022-05-09 ENCOUNTER — Emergency Department (HOSPITAL_BASED_OUTPATIENT_CLINIC_OR_DEPARTMENT_OTHER): Payer: Medicare Other

## 2022-05-09 ENCOUNTER — Encounter (HOSPITAL_COMMUNITY): Payer: Self-pay

## 2022-05-09 ENCOUNTER — Encounter (HOSPITAL_BASED_OUTPATIENT_CLINIC_OR_DEPARTMENT_OTHER): Payer: Self-pay | Admitting: Emergency Medicine

## 2022-05-09 ENCOUNTER — Other Ambulatory Visit: Payer: Self-pay

## 2022-05-09 ENCOUNTER — Inpatient Hospital Stay (HOSPITAL_BASED_OUTPATIENT_CLINIC_OR_DEPARTMENT_OTHER)
Admission: EM | Admit: 2022-05-09 | Discharge: 2022-05-14 | DRG: 372 | Disposition: A | Discharge status: Skilled Nursing Facility | Payer: Medicare Other | Source: Skilled Nursing Facility | Attending: Internal Medicine | Admitting: Internal Medicine

## 2022-05-09 DIAGNOSIS — H3530 Unspecified macular degeneration: Secondary | ICD-10-CM | POA: Diagnosis not present

## 2022-05-09 DIAGNOSIS — Z7982 Long term (current) use of aspirin: Secondary | ICD-10-CM

## 2022-05-09 DIAGNOSIS — Z885 Allergy status to narcotic agent status: Secondary | ICD-10-CM

## 2022-05-09 DIAGNOSIS — Z66 Do not resuscitate: Secondary | ICD-10-CM | POA: Diagnosis present

## 2022-05-09 DIAGNOSIS — Z882 Allergy status to sulfonamides status: Secondary | ICD-10-CM | POA: Diagnosis not present

## 2022-05-09 DIAGNOSIS — M1990 Unspecified osteoarthritis, unspecified site: Secondary | ICD-10-CM | POA: Diagnosis not present

## 2022-05-09 DIAGNOSIS — R197 Diarrhea, unspecified: Secondary | ICD-10-CM | POA: Diagnosis not present

## 2022-05-09 DIAGNOSIS — Z8619 Personal history of other infectious and parasitic diseases: Secondary | ICD-10-CM | POA: Diagnosis not present

## 2022-05-09 DIAGNOSIS — E039 Hypothyroidism, unspecified: Secondary | ICD-10-CM | POA: Diagnosis not present

## 2022-05-09 DIAGNOSIS — Z87440 Personal history of urinary (tract) infections: Secondary | ICD-10-CM

## 2022-05-09 DIAGNOSIS — Z9841 Cataract extraction status, right eye: Secondary | ICD-10-CM

## 2022-05-09 DIAGNOSIS — A09 Infectious gastroenteritis and colitis, unspecified: Secondary | ICD-10-CM | POA: Diagnosis present

## 2022-05-09 DIAGNOSIS — R1110 Vomiting, unspecified: Secondary | ICD-10-CM | POA: Diagnosis not present

## 2022-05-09 DIAGNOSIS — F0280 Dementia in other diseases classified elsewhere without behavioral disturbance: Secondary | ICD-10-CM | POA: Diagnosis present

## 2022-05-09 DIAGNOSIS — R112 Nausea with vomiting, unspecified: Secondary | ICD-10-CM | POA: Diagnosis not present

## 2022-05-09 DIAGNOSIS — A0472 Enterocolitis due to Clostridium difficile, not specified as recurrent: Secondary | ICD-10-CM | POA: Diagnosis not present

## 2022-05-09 DIAGNOSIS — I480 Paroxysmal atrial fibrillation: Secondary | ICD-10-CM | POA: Diagnosis present

## 2022-05-09 DIAGNOSIS — K219 Gastro-esophageal reflux disease without esophagitis: Secondary | ICD-10-CM | POA: Diagnosis present

## 2022-05-09 DIAGNOSIS — A0471 Enterocolitis due to Clostridium difficile, recurrent: Principal | ICD-10-CM | POA: Diagnosis present

## 2022-05-09 DIAGNOSIS — R109 Unspecified abdominal pain: Secondary | ICD-10-CM | POA: Diagnosis not present

## 2022-05-09 DIAGNOSIS — Z888 Allergy status to other drugs, medicaments and biological substances status: Secondary | ICD-10-CM | POA: Diagnosis not present

## 2022-05-09 DIAGNOSIS — Z9842 Cataract extraction status, left eye: Secondary | ICD-10-CM

## 2022-05-09 DIAGNOSIS — M7989 Other specified soft tissue disorders: Secondary | ICD-10-CM | POA: Diagnosis not present

## 2022-05-09 DIAGNOSIS — Z79890 Hormone replacement therapy (postmenopausal): Secondary | ICD-10-CM | POA: Diagnosis not present

## 2022-05-09 DIAGNOSIS — W19XXXA Unspecified fall, initial encounter: Secondary | ICD-10-CM | POA: Diagnosis present

## 2022-05-09 DIAGNOSIS — Z961 Presence of intraocular lens: Secondary | ICD-10-CM | POA: Diagnosis not present

## 2022-05-09 DIAGNOSIS — N179 Acute kidney failure, unspecified: Secondary | ICD-10-CM | POA: Diagnosis not present

## 2022-05-09 DIAGNOSIS — R531 Weakness: Secondary | ICD-10-CM

## 2022-05-09 DIAGNOSIS — Z79899 Other long term (current) drug therapy: Secondary | ICD-10-CM | POA: Diagnosis not present

## 2022-05-09 DIAGNOSIS — E860 Dehydration: Secondary | ICD-10-CM | POA: Diagnosis not present

## 2022-05-09 DIAGNOSIS — I700 Atherosclerosis of aorta: Secondary | ICD-10-CM | POA: Diagnosis not present

## 2022-05-09 DIAGNOSIS — R195 Other fecal abnormalities: Secondary | ICD-10-CM

## 2022-05-09 DIAGNOSIS — G309 Alzheimer's disease, unspecified: Secondary | ICD-10-CM | POA: Diagnosis present

## 2022-05-09 DIAGNOSIS — K529 Noninfective gastroenteritis and colitis, unspecified: Secondary | ICD-10-CM

## 2022-05-09 DIAGNOSIS — K859 Acute pancreatitis without necrosis or infection, unspecified: Principal | ICD-10-CM

## 2022-05-09 DIAGNOSIS — R9431 Abnormal electrocardiogram [ECG] [EKG]: Secondary | ICD-10-CM | POA: Diagnosis not present

## 2022-05-09 HISTORY — DX: Dementia in other diseases classified elsewhere, unspecified severity, without behavioral disturbance, psychotic disturbance, mood disturbance, and anxiety: F02.80

## 2022-05-09 LAB — COMPREHENSIVE METABOLIC PANEL
ALT: 19 U/L (ref 0–44)
AST: 39 U/L (ref 15–41)
Albumin: 3.8 g/dL (ref 3.5–5.0)
Alkaline Phosphatase: 105 U/L (ref 38–126)
Anion gap: 10 (ref 5–15)
BUN: 20 mg/dL (ref 8–23)
CO2: 27 mmol/L (ref 22–32)
Calcium: 8.9 mg/dL (ref 8.9–10.3)
Chloride: 98 mmol/L (ref 98–111)
Creatinine, Ser: 1.01 mg/dL — ABNORMAL HIGH (ref 0.44–1.00)
GFR, Estimated: 53 mL/min — ABNORMAL LOW (ref >60)
Glucose, Bld: 116 mg/dL — ABNORMAL HIGH (ref 70–99)
Potassium: 3.6 mmol/L (ref 3.5–5.1)
Sodium: 135 mmol/L (ref 135–145)
Total Bilirubin: 0.8 mg/dL (ref 0.3–1.2)
Total Protein: 8 g/dL (ref 6.5–8.1)

## 2022-05-09 LAB — CBC
HCT: 44.3 % (ref 36.0–46.0)
Hemoglobin: 14.4 g/dL (ref 12.0–15.0)
MCH: 31.2 pg (ref 26.0–34.0)
MCHC: 32.5 g/dL (ref 30.0–36.0)
MCV: 96.1 fL (ref 80.0–100.0)
Platelets: 237 10*3/uL (ref 150–400)
RBC: 4.61 MIL/uL (ref 3.87–5.11)
RDW: 13.7 % (ref 11.5–15.5)
WBC: 6.6 10*3/uL (ref 4.0–10.5)
nRBC: 0 % (ref 0.0–0.2)

## 2022-05-09 LAB — TROPONIN I (HIGH SENSITIVITY)
Troponin I (High Sensitivity): 7 ng/L (ref <18)
Troponin I (High Sensitivity): 9 ng/L (ref <18)

## 2022-05-09 LAB — LIPASE, BLOOD: Lipase: 202 U/L — ABNORMAL HIGH (ref 11–51)

## 2022-05-09 LAB — LACTIC ACID, PLASMA: Lactic Acid, Venous: 1.1 mmol/L (ref 0.5–1.9)

## 2022-05-09 MED ORDER — SODIUM CHLORIDE 0.9 % IV BOLUS
1000.0000 mL | Freq: Once | INTRAVENOUS | Status: AC
  Administered 2022-05-09: 1000 mL via INTRAVENOUS

## 2022-05-09 MED ORDER — ONDANSETRON HCL 4 MG/2ML IJ SOLN
4.0000 mg | Freq: Four times a day (QID) | INTRAMUSCULAR | Status: DC | PRN
  Administered 2022-05-10: 4 mg via INTRAVENOUS
  Filled 2022-05-09: qty 2

## 2022-05-09 MED ORDER — ACETAMINOPHEN 650 MG RE SUPP: 650.0000 mg | Freq: Four times a day (QID) | RECTAL | Status: DC | PRN

## 2022-05-09 MED ORDER — DILTIAZEM HCL ER COATED BEADS 120 MG PO CP24
120.0000 mg | ORAL_CAPSULE | Freq: Every day | ORAL | Status: DC
  Administered 2022-05-10 – 2022-05-14 (×5): 120 mg via ORAL
  Filled 2022-05-09 (×5): qty 1

## 2022-05-09 MED ORDER — SERTRALINE HCL 100 MG PO TABS
100.0000 mg | ORAL_TABLET | Freq: Every day | ORAL | Status: DC
  Administered 2022-05-10 – 2022-05-11 (×2): 100 mg via ORAL
  Filled 2022-05-09 (×2): qty 1

## 2022-05-09 MED ORDER — ASPIRIN 81 MG PO CHEW
81.0000 mg | CHEWABLE_TABLET | Freq: Every day | ORAL | Status: DC
  Administered 2022-05-10 – 2022-05-14 (×5): 81 mg via ORAL
  Filled 2022-05-09 (×5): qty 1

## 2022-05-09 MED ORDER — IOHEXOL 300 MG/ML  SOLN
75.0000 mL | Freq: Once | INTRAMUSCULAR | Status: AC | PRN
  Administered 2022-05-09: 75 mL via INTRAVENOUS

## 2022-05-09 MED ORDER — LEVOTHYROXINE SODIUM 50 MCG PO TABS
50.0000 ug | ORAL_TABLET | Freq: Every day | ORAL | Status: DC
  Administered 2022-05-10 – 2022-05-14 (×5): 50 ug via ORAL
  Filled 2022-05-09 (×5): qty 1

## 2022-05-09 MED ORDER — SODIUM CHLORIDE 0.9 % IV SOLN
12.5000 mg | Freq: Four times a day (QID) | INTRAVENOUS | Status: DC | PRN
  Filled 2022-05-09: qty 0.5

## 2022-05-09 MED ORDER — LACTATED RINGERS IV SOLN: INTRAVENOUS | Status: AC

## 2022-05-09 MED ORDER — MELATONIN 3 MG PO TABS
3.0000 mg | ORAL_TABLET | Freq: Every evening | ORAL | Status: DC | PRN
  Administered 2022-05-10 – 2022-05-13 (×4): 3 mg via ORAL
  Filled 2022-05-09 (×4): qty 1

## 2022-05-09 MED ORDER — ACETAMINOPHEN 325 MG PO TABS: 650.0000 mg | ORAL_TABLET | Freq: Four times a day (QID) | ORAL | Status: DC | PRN

## 2022-05-09 MED ORDER — ONDANSETRON HCL 4 MG/2ML IJ SOLN
4.0000 mg | Freq: Once | INTRAMUSCULAR | Status: AC
  Administered 2022-05-09: 4 mg via INTRAVENOUS
  Filled 2022-05-09: qty 2

## 2022-05-09 NOTE — ED Provider Notes (Signed)
[place] EMERGENCY DEPARTMENT Provider Note   CSN: [id number] Arrival date & time: 05/09/22  1129     History  Chief Complaint  Patient presents with   Diarrhea    Mallory Grant is a 88 y.o. female.  Level 5 caveat for dementia.  Patient here with son.  He reports a 1 day history of vomiting and diarrhea.  He is concerned about possible C. difficile again as patient was hospitalized with this in February and was quite ill.  Unknown how many times she said diarrhea or vomiting.  They report no black or bloody stools.  No known sick contacts.  No known fever.  Did complete antibiotics for UTI about a week ago. Patient has been behaving normally.  She denies any abdominal pain, chest pain or shortness of breath.  Denies any pain with urination or blood in the urine.  No known fever.  No known sick contacts Still has appendix and gallbladder.  Patient did have a fall yesterday secondary to weakness where she lowered to the ground but did not injure herself or hit her head.  The history is provided by the patient and a relative.  Diarrhea      Home Medications Prior to Admission medications   Medication Sig Start Date End Date Taking? Authorizing Provider  acetaminophen (TYLENOL) 325 MG tablet Take by mouth every 6 (six) hours as needed for mild pain.    [provider]  aspirin 81 MG chewable tablet Chew 1 tablet (81 mg total) by mouth daily. 08/02/21   Pokhrel, Corrie Mckusick, MD  Cholecalciferol (VITAMIN D PO) Take 2,000 Units by mouth daily.    [provider]  diltiazem (CARDIZEM CD) 120 MG 24 hr capsule Take 1 capsule (120 mg total) by mouth daily. 08/02/21   Pokhrel, Corrie Mckusick, MD  donepezil (ARICEPT) 10 MG tablet Take 10 mg by mouth at bedtime.    [provider]  levothyroxine (SYNTHROID) 50 MCG tablet Take 50 mcg by mouth daily.    [provider]  Multiple Vitamins-Minerals (PRESERVISION AREDS 2) CAPS Take 2 tablets by mouth daily.     [provider]  ondansetron (ZOFRAN) 4 MG tablet Take 1 tablet (4 mg total) by mouth every 6 (six) hours as needed for nausea. 08/02/21   Pokhrel, Corrie Mckusick, MD  potassium chloride SA (KLOR-CON M) 20 MEQ tablet Take 1 tablet (20 mEq total) by mouth daily for 5 days. 08/02/21 08/07/21  Pokhrel, Corrie Mckusick, MD  sertraline (ZOLOFT) 100 MG tablet Take 100 mg by mouth daily.    [provider]  traZODone (DESYREL) 100 MG tablet Take 100 mg by mouth at bedtime.    [provider]      Allergies    Diphenhydramine-zinc acetate, Procaine, Benadryl [diphenhydramine hcl], Codeine, Novocain [procaine hcl], and Sulfa antibiotics    Review of Systems   Review of Systems  Unable to perform ROS: Dementia  Gastrointestinal:  Positive for diarrhea.    Physical Exam Updated Vital Signs BP (!) 131/51   Pulse 72   Temp (!) 97.3 F (36.3 C)   Resp 20   Ht '5\' 6"'$  (1.676 m)   Wt 65 kg   SpO2 92%   BMI 23.13 kg/m  Physical Exam Vitals and nursing note reviewed.  Constitutional:      General: She is not in acute distress.    Appearance: She is well-developed.  HENT:     Head: Normocephalic and atraumatic.     Mouth/Throat:  Mouth: Mucous membranes are dry.     Pharynx: No oropharyngeal exudate.  Eyes:     Conjunctiva/sclera: Conjunctivae normal.     Pupils: Pupils are equal, round, and reactive to light.  Neck:     Comments: No meningismus. Cardiovascular:     Rate and Rhythm: Normal rate and regular rhythm.     Heart sounds: Normal heart sounds. No murmur heard. Pulmonary:     Effort: Pulmonary effort is normal. No respiratory distress.     Breath sounds: Normal breath sounds.  Abdominal:     Palpations: Abdomen is soft.     Tenderness: There is abdominal tenderness. There is no guarding or rebound.     Comments: Mild diffuse tenderness  Musculoskeletal:        General: No tenderness. Normal range of motion.     Cervical back: Normal range of motion and neck supple.   Skin:    General: Skin is warm.  Neurological:     Mental Status: She is alert.     Cranial Nerves: No cranial nerve deficit.     Motor: No abnormal muscle tone.     Coordination: Coordination normal.     Comments:  5/5 strength throughout. CN 2-12 intact.Equal grip strength.  Oriented to person and place.  Psychiatric:        Behavior: Behavior normal.     ED Results / Procedures / Treatments   Labs (all labs ordered are listed, but only abnormal results are displayed) Labs Reviewed  LIPASE, BLOOD - Abnormal; Notable for the following components:      Result Value   Lipase 202 (*)    All other components within normal limits  COMPREHENSIVE METABOLIC PANEL - Abnormal; Notable for the following components:   Glucose, Bld 116 (*)    Creatinine, Ser 1.01 (*)    GFR, Estimated 53 (*)    All other components within normal limits  C DIFFICILE QUICK SCREEN W PCR REFLEX    GASTROINTESTINAL PANEL BY PCR, STOOL (REPLACES STOOL CULTURE)  CBC  LACTIC ACID, PLASMA  URINALYSIS, ROUTINE W REFLEX MICROSCOPIC  TROPONIN I (HIGH SENSITIVITY)  TROPONIN I (HIGH SENSITIVITY)    EKG EKG Interpretation  Date/Time:  Friday May 09 2022 16:19:17 EST Ventricular Rate:  70 PR Interval:  167 QRS Duration: 98 QT Interval:  410 QTC Calculation: 443 R Axis:   66 Text Interpretation: Sinus rhythm No significant change was found Confirmed by Ezequiel Essex [phone]) on 05/09/2022 4:26:01 PM  Radiology CT ABDOMEN PELVIS W CONTRAST  Result Date: 05/09/2022 CLINICAL DATA:  Abdomen pain diarrhea vomiting EXAM: CT ABDOMEN AND PELVIS WITH CONTRAST TECHNIQUE: Multidetector CT imaging of the abdomen and pelvis was performed using the standard protocol following bolus administration of intravenous contrast. RADIATION DOSE REDUCTION: This exam was performed according to the departmental dose-optimization program which includes automated exposure control, adjustment of the mA and/or kV according to  patient size and/or use of iterative reconstruction technique. CONTRAST:  63m OMNIPAQUE IOHEXOL 300 MG/ML  SOLN COMPARISON:  None Available. FINDINGS: Lower chest: Lung bases demonstrate no acute airspace disease. Fluid-filled moderate hiatal hernia. Hepatobiliary: Mildly distended gallbladder without calcified stone. No focal hepatic abnormality. Mild intra hepatic biliary dilatation. Common bile duct diameter up to 8 mm. Pancreas: No inflammatory changes. Mild dilatation of pancreatic duct at the head. Spleen: Normal in size without focal abnormality. Adrenals/Urinary Tract: Adrenal glands are unremarkable. Kidneys are normal, without renal calculi, focal lesion, or hydronephrosis. Bladder is unremarkable. Stomach/Bowel: Moderate fluid distension  of stomach. Multiple fluid-filled loops of small bowel. Fluid in the colon. Mild fecal impaction at the rectosigmoid colon. No acute bowel wall thickening. Mild mucosal enhancement of pelvic small bowel loops. Vascular/Lymphatic: Moderate aortic atherosclerosis. No aneurysm. No suspicious lymph nodes. Heterogenous opacification of the bilateral common femoral veins. Reproductive: Fluid in the upper uterine segment versus endometrial thickening. No adnexal mass Other: Small volume pelvic free fluid.  No free air. Musculoskeletal: No acute or significant osseous findings. IMPRESSION: 1. Multiple fluid-filled loops of small and large bowel with mild mucosal enhancement of pelvic small bowel loops, findings favor ileus or enteritis. No convincing evidence for bowel obstruction. 2. Small volume pelvic free fluid. 3. Fluid in the upper uterine segment versus endometrial thickening, suggest correlation with nonemergent pelvic ultrasound. 4. Heterogenous opacification of the bilateral common femoral veins, probably due to mixing artifact but recommend correlation with bilateral lower extremity venous ultrasound to exclude DVT. 5. Mild intra and extrahepatic biliary dilatation  with mild dilatation of pancreatic duct at the pancreatic head. Suggest correlation with LFTs and follow-up MRCP as indicated. 6. Aortic atherosclerosis. Aortic Atherosclerosis (ICD10-I70.0). Electronically Signed   By: Donavan Foil M.D.   On: 05/09/2022 17:35    Procedures Procedures    Medications Ordered in ED Medications  sodium chloride 0.9 % bolus 1,000 mL (has no administration in time range)  ondansetron (ZOFRAN) injection 4 mg (has no administration in time range)    ED Course/ Medical Decision Making/ A&P                           Medical Decision Making Amount and/or Complexity of Data Reviewed Labs: ordered. Decision-making details documented in ED Course. Radiology: ordered and independent interpretation performed. Decision-making details documented in ED Course. ECG/medicine tests: ordered and independent interpretation performed. Decision-making details documented in ED Course.  Risk Prescription drug management. Decision regarding hospitalization.  1 day history of vomiting, diarrhea, abdominal pain and nausea with history of C. difficile.  Vital stable, no distress.  Abdomen soft without peritoneal signs.  We will check labs, hydrate, urinalysis  IV fluids given.  Lipase 202.  LFTs normal, white blood cell count normal. Will check CT scan given elevated lipase and diarrhea and vomiting  CT scan as above.  Shows multiple areas of dilated bowel loops concerning for ileus or enteritis.  No frank bowel obstruction currently Mild dilation of hepatic and pancreatic ducts.  Patient able to give any bowel movement samples.  Suspect likely viral illness causing possible enteritis and possible early pancreatitis.  We will hydrate.  Patient unable to have a bowel movement in the ED.  CT scan as above shows evidence of ileus and enteritis and possible free fluid in the pelvis.  Will obtain Doppler ultrasound of her legs.  She also has some dilated ducts involving her  hepatic and pancreatic ducts but LFTs are normal.  Abdomen soft on recheck.  Patient still with some nausea.  We will plan overnight hydration for IV fluids and symptom control while stool studies are pending. Doppler negative for DVT.  Admission discussed with Dr. Jonelle Sidle          Final Clinical Impression(s) / ED Diagnoses Final diagnoses:  Acute pancreatitis, unspecified complication status, unspecified pancreatitis type  Nausea vomiting and diarrhea    Rx / DC Orders ED Discharge Orders     None         Ezequiel Essex, MD 05/09/22 2001

## 2022-05-09 NOTE — ED Notes (Signed)
[facility]icare Skin Barrier cream  Brief Clean Gown  Purewick

## 2022-05-09 NOTE — ED Notes (Signed)
Called [facility] for transport at 6:48

## 2022-05-09 NOTE — ED Notes (Signed)
Pt. Son reports to RN that the Pt. Stays in a Memory care unit and he has been out of town.  Upon his arrival back home he had a phone call and was told that his mother had diarrhea.  Pt. Has history of C Diff.

## 2022-05-09 NOTE — ED Triage Notes (Signed)
NVD started last night , pt from facility , son with patient and will transport pt .  Reports witnessed fall today at the facility , progressive weakness . Denies urinary symptoms or any pain .

## 2022-05-09 NOTE — H&P (Signed)
History and Physical      Mallory Grant SEG:[id number] DOB: Mar 13, 1929 DOA: 05/09/2022  PCP: Shirline Frees, MD  Patient coming from: home   I have personally briefly reviewed patient's old medical records in [place]  Chief Complaint: Nausea vomiting  HPI: Mallory Grant is a 88 y.o. female with medical history significant for paroxysmal atrial fibrillation, not chronically anticoagulated, C. difficile colitis in February 2023, Alzheimer's dementia, acquired hypothyroidism, who is admitted to [facility] on 05/09/2022 by way of transfer from [place] with intractable nausea/dry after presenting from home to the other facility complaining of nausea/vomiting.  In the setting of the patient's dementia, the following history is provided by patient as well as her son, in addition to chart review.  The patient has been experiencing recurrent nausea/vomiting over the course of the last day, noting at least 3-4 episodes of nonbloody, nonbilious emesis over that timeframe, with most recent episode occurring just prior to presenting to [place] this evening.  In this context, the patient has been unable to tolerate p.o., including home oral medications, and notes significant decline in oral intake of both food and water over the course of the last day as a consequence.  After the first few episodes of nausea/vomiting, she also developed new onset loose stool, reporting at least 2 such episodes since onset, which have not been associated with any melena or hematochezia.  Most recent prior episode of loose stool reportedly occurred just prior to presenting to [place] emergency department today.  The patient denies any associated abdominal discomfort nor any associated subjective fever, chills, rigors, or generalized myalgias.  No recent chest pain or shortness of breath.  She has a history of C. difficile colitis prompting hospitalization in February  2023.  She was recently diagnosed with a urinary tract infection as an outpatient, and completed a course of outpatient antibiotics, with final doses of this antibiotic regimen completed approximately 1 week ago.  The patient reports interval resolution of her previous dysuria.    Over the last day, she also notes generalized weakness, in the absence of of any associated acute focal weakness, acute focal numbness, paresthesias, facial droop, slurred speech, expressive aphasia, acute change in vision, dysphagia, vertigo.  She admits that this generalized weakness has rendered her ambulation at home to be more difficult.  As consequence of this generalized weakness, she notes that she required some assistance and being lowered to the floor while attempting to ambulate at home.  She conveys that this did not result in an overt fall, and she did not hit her head as competitive this.     [facility] ED Course:  Vital signs in the ED were notable for the following: Afebrile; heart rate 70-80; initial blood pressure 101/73, subsequently increasing to 124/67 following interval IV fluids, as further detailed below; respiratory rate 17-22, oxygen saturation 94 to 95% on room air.  Labs were notable for the following: CMP notable for the following: Potassium 3.6, bicarbonate 27, BUN 20, creatinine 1.01 compared to most recent prior value 0.57 in February 2023, glucose 116, liver enzymes within normal limits.  Lipase 202, without any prior lipase data points available for point comparison.  High-sensitivity troponin I noted to be 7.  Lactate 1.1.  CBC notable for white cell count 6600.  Urinalysis has been ordered, with result currently pending.  Additionally, C. difficile PCR as well as GI panel by PCR were  ordered, with results currently pending.  Per my interpretation, EKG in ED demonstrated the following: Sinus rhythm, heart rate 70, normal intervals, and no evidence of T wave or ST changes,  including no evidence of ST elevation.  Imaging and additional notable ED work-up: CT abdomen/pelvis, per radiology read, showed multiple fluid-filled loops of small and large bowel and mild mucosal enhancement of the pelvic small bowel loops suggestive of enteritis, with differential also including ileus, without any evidence of bowel obstruction, abscess, or perforation.  CT abdomen/pelvis also shows mild intra and extrahepatic biliary dilation with mild dilation of the pancreatic duct, at the pancreatic head, will noting common bile duct to measure 8 mm. no evidence of prior stenting.  Additionally, CT abdomen/pelvis showed no evidence of gallbladder wall thickening or pericholecystic fluid, nor any evidence of peripancreatic inflammation/fluid.   While in the ED, the following were administered: Zofran 4 mg IV x1, normal saline x1 L bolus.  Subsequently, the patient was admitted to [facility] for further evaluation and treatment of her presenting intractable nausea/vomiting with associated current inability to tolerate p.o., with clinical/radiographic evidence to suggest contributory enteritis, and with presentation also notable for dehydration, generalized weakness, and acute kidney injury.     Review of Systems: As per HPI otherwise 10 point review of systems negative.   Past Medical History:  Diagnosis Date   Alzheimer disease ([place])    Arthritis    Wrists and knees   Atrial fibrillation (HCC)    "lone", paroxysmal manner   Atrial tachycardia    Baker's cyst    right calf   Constipation    Dementia (HCC)    GERD (gastroesophageal reflux disease)    Headache(784.0)    History of iritis    History of TMJ syndrome    left side   Hypothyroidism    Macular degeneration    Macular degeneration     Past Surgical History:  Procedure Laterality Date   DOPPLER ECHOCARDIOGRAPHY     EXCISION MORTON'S NEUROMA     EYE SURGERY     bil cataracts w IOL   KNEE ARTHROSCOPY WITH LATERAL  MENISECTOMY  07/08/2012   Procedure: KNEE ARTHROSCOPY WITH LATERAL MENISECTOMY;  Surgeon: Magnus Sinning, MD;  Location: [facility];  Service: Orthopedics;  Laterality: Right;   Partial     KNEE ARTHROSCOPY WITH MEDIAL MENISECTOMY  07/08/2012   Procedure: KNEE ARTHROSCOPY WITH MEDIAL MENISECTOMY;  Surgeon: Magnus Sinning, MD;  Location: [facility];  Service: Orthopedics;  Laterality: Right;  partial   LEFT HEART CATHETERIZATION WITH CORONARY ANGIOGRAM N/A 04/08/2013   Procedure: LEFT HEART CATHETERIZATION WITH CORONARY ANGIOGRAM;  Surgeon: Sanda Klein, MD;  Location: [facility] CATH [facility];  Service: Cardiovascular;  Laterality: N/A;   nuclear scintography     TONSILLECTOMY     WRIST SURGERY      Social History:  reports that she has never smoked. She has never used smokeless tobacco. She reports current alcohol use. She reports that she does not use drugs.   Allergies  Allergen Reactions   Diphenhydramine-Zinc Acetate     Other reaction(s): Other (See Comments) Makes hyper   Procaine     Other reaction(s): Other (See Comments) Makes heart race    Benadryl [Diphenhydramine Hcl] Other (See Comments)    hyperactivity   Codeine Nausea And Vomiting and Nausea Only   Novocain [Procaine Hcl] Other (See Comments)    tachycardia   Sulfa Antibiotics Nausea And Vomiting and Nausea Only  Family History  Problem Relation Age of Onset   Diabetes Mother    Alzheimer's disease Father     Family history reviewed and not pertinent    Prior to Admission medications   Medication Sig Start Date End Date Taking? Authorizing Provider  acetaminophen (TYLENOL) 325 MG tablet Take by mouth every 6 (six) hours as needed for mild pain.    [provider]  aspirin 81 MG chewable tablet Chew 1 tablet (81 mg total) by mouth daily. 08/02/21   Pokhrel, Corrie Mckusick, MD  Cholecalciferol (VITAMIN D PO) Take 2,000 Units by mouth daily.    [provider]   diltiazem (CARDIZEM CD) 120 MG 24 hr capsule Take 1 capsule (120 mg total) by mouth daily. 08/02/21   Pokhrel, Corrie Mckusick, MD  donepezil (ARICEPT) 10 MG tablet Take 10 mg by mouth at bedtime.    [provider]  levothyroxine (SYNTHROID) 50 MCG tablet Take 50 mcg by mouth daily.    [provider]  Multiple Vitamins-Minerals (PRESERVISION AREDS 2) CAPS Take 2 tablets by mouth daily.    [provider]  ondansetron (ZOFRAN) 4 MG tablet Take 1 tablet (4 mg total) by mouth every 6 (six) hours as needed for nausea. 08/02/21   Pokhrel, Corrie Mckusick, MD  potassium chloride SA (KLOR-CON M) 20 MEQ tablet Take 1 tablet (20 mEq total) by mouth daily for 5 days. 08/02/21 08/07/21  Pokhrel, Corrie Mckusick, MD  sertraline (ZOLOFT) 100 MG tablet Take 100 mg by mouth daily.    [provider]  traZODone (DESYREL) 100 MG tablet Take 100 mg by mouth at bedtime.    [provider]     Objective    Physical Exam: Vitals:   05/09/22 1806 05/09/22 1900 05/09/22 2000 05/09/22 2054  BP:   110/88 124/67  Pulse:   73 75  Resp:  17 (!) 22 18  Temp:    97.7 F (36.5 C)  TempSrc:    Oral  SpO2: 94%  95% 94%  Weight:      Height:        General: appears to be stated age; alert, confused Skin: warm, dry, no rash Head:  AT/[place] Mouth:  Oral mucosa membranes appear dry, normal dentition Neck: supple; trachea midline Heart:  RRR; did not appreciate any M/R/G Lungs: CTAB, did not appreciate any wheezes, rales, or rhonchi Abdomen: + BS; soft, ND, NT Vascular: 2+ pedal pulses b/l; 2+ radial pulses b/l Extremities: no peripheral edema, no muscle wasting Neuro: strength and sensation intact in upper and lower extremities b/l     Labs on Admission: I have personally reviewed following labs and imaging studies  CBC: Recent Labs  Lab 05/09/22 1537  WBC 6.6  HGB 14.4  HCT 44.3  MCV 96.1  PLT 630   Basic Metabolic Panel: Recent Labs  Lab 05/09/22 1537  NA 135  K 3.6  CL 98   CO2 27  GLUCOSE 116*  BUN 20  CREATININE 1.01*  CALCIUM 8.9   GFR: Estimated Creatinine Clearance: 34.7 mL/min (A) (by C-G formula based on SCr of 1.01 mg/dL (H)). Liver Function Tests: Recent Labs  Lab 05/09/22 1537  AST 39  ALT 19  ALKPHOS 105  BILITOT 0.8  PROT 8.0  ALBUMIN 3.8   Recent Labs  Lab 05/09/22 1537  LIPASE 202*   No results for input(s): "AMMONIA" in the last 168 hours. Coagulation Profile: No results for input(s): "INR", "PROTIME" in the last 168 hours. Cardiac Enzymes: No results for input(s): "  CKTOTAL", "CKMB", "CKMBINDEX", "TROPONINI" in the last 168 hours. BNP (last 3 results) No results for input(s): "PROBNP" in the last 8760 hours. HbA1C: No results for input(s): "HGBA1C" in the last 72 hours. CBG: No results for input(s): "GLUCAP" in the last 168 hours. Lipid Profile: No results for input(s): "CHOL", "HDL", "LDLCALC", "TRIG", "CHOLHDL", "LDLDIRECT" in the last 72 hours. Thyroid Function Tests: No results for input(s): "TSH", "T4TOTAL", "FREET4", "T3FREE", "THYROIDAB" in the last 72 hours. Anemia Panel: No results for input(s): "VITAMINB12", "FOLATE", "FERRITIN", "TIBC", "IRON", "RETICCTPCT" in the last 72 hours. Urine analysis:    Component Value Date/Time   COLORURINE YELLOW 07/28/2021 1734   APPEARANCEUR HAZY (A) 07/28/2021 1734   LABSPEC >=1.030 07/28/2021 1734   PHURINE 6.0 07/28/2021 1734   GLUCOSEU NEGATIVE 07/28/2021 1734   HGBUR NEGATIVE 07/28/2021 1734   BILIRUBINUR NEGATIVE 07/28/2021 1734   KETONESUR NEGATIVE 07/28/2021 1734   PROTEINUR 30 (A) 07/28/2021 1734   NITRITE NEGATIVE 07/28/2021 1734   LEUKOCYTESUR NEGATIVE 07/28/2021 1734    Radiological Exams on Admission: US Venous Img Lower Bilateral (DVT)  Result Date: 05/09/2022 CLINICAL DATA:  Leg pain.  Lower extremity swelling. EXAM: BILATERAL LOWER EXTREMITY VENOUS DOPPLER ULTRASOUND TECHNIQUE: Gray-scale sonography with compression, as well as color and duplex  ultrasound, were performed to evaluate the deep venous system(s) from the level of the common femoral vein through the popliteal and proximal calf veins. COMPARISON:  None Available. FINDINGS: VENOUS Normal compressibility of the common femoral, superficial femoral, and popliteal veins, as well as the visualized calf veins. Visualized portions of profunda femoral vein and great saphenous vein unremarkable. No filling defects to suggest DVT on grayscale or color Doppler imaging. Doppler waveforms show normal direction of venous flow, normal respiratory plasticity and response to augmentation. OTHER None. Limitations: none IMPRESSION: No evidence of bilateral lower extremity DVT. Electronically Signed   By: Keith Rake M.D.   On: 05/09/2022 18:52   CT ABDOMEN PELVIS W CONTRAST  Result Date: 05/09/2022 CLINICAL DATA:  Abdomen pain diarrhea vomiting EXAM: CT ABDOMEN AND PELVIS WITH CONTRAST TECHNIQUE: Multidetector CT imaging of the abdomen and pelvis was performed using the standard protocol following bolus administration of intravenous contrast. RADIATION DOSE REDUCTION: This exam was performed according to the departmental dose-optimization program which includes automated exposure control, adjustment of the mA and/or kV according to patient size and/or use of iterative reconstruction technique. CONTRAST:  66m OMNIPAQUE IOHEXOL 300 MG/ML  SOLN COMPARISON:  None Available. FINDINGS: Lower chest: Lung bases demonstrate no acute airspace disease. Fluid-filled moderate hiatal hernia. Hepatobiliary: Mildly distended gallbladder without calcified stone. No focal hepatic abnormality. Mild intra hepatic biliary dilatation. Common bile duct diameter up to 8 mm. Pancreas: No inflammatory changes. Mild dilatation of pancreatic duct at the head. Spleen: Normal in size without focal abnormality. Adrenals/Urinary Tract: Adrenal glands are unremarkable. Kidneys are normal, without renal calculi, focal lesion, or  hydronephrosis. Bladder is unremarkable. Stomach/Bowel: Moderate fluid distension of stomach. Multiple fluid-filled loops of small bowel. Fluid in the colon. Mild fecal impaction at the rectosigmoid colon. No acute bowel wall thickening. Mild mucosal enhancement of pelvic small bowel loops. Vascular/Lymphatic: Moderate aortic atherosclerosis. No aneurysm. No suspicious lymph nodes. Heterogenous opacification of the bilateral common femoral veins. Reproductive: Fluid in the upper uterine segment versus endometrial thickening. No adnexal mass Other: Small volume pelvic free fluid.  No free air. Musculoskeletal: No acute or significant osseous findings. IMPRESSION: 1. Multiple fluid-filled loops of small and large bowel with mild mucosal enhancement of pelvic small  bowel loops, findings favor ileus or enteritis. No convincing evidence for bowel obstruction. 2. Small volume pelvic free fluid. 3. Fluid in the upper uterine segment versus endometrial thickening, suggest correlation with nonemergent pelvic ultrasound. 4. Heterogenous opacification of the bilateral common femoral veins, probably due to mixing artifact but recommend correlation with bilateral lower extremity venous ultrasound to exclude DVT. 5. Mild intra and extrahepatic biliary dilatation with mild dilatation of pancreatic duct at the pancreatic head. Suggest correlation with LFTs and follow-up MRCP as indicated. 6. Aortic atherosclerosis. Aortic Atherosclerosis (ICD10-I70.0). Electronically Signed   By: Donavan Foil M.D.   On: 05/09/2022 17:35      Assessment/Plan    Principal Problem:   Intractable nausea and vomiting Active Problems:   Acquired hypothyroidism   Generalized weakness   Dehydration   Loose stools   Enteritis   AKI (acute kidney injury) ([place])   Paroxysmal atrial fibrillation (HCC)      #) Intractable nausea/vomiting: 1 day of recurrent nausea resulting in at least 3-4 episodes of nonbloody, nonbilious emesis,  associated with significant decline in oral intake of food/water that timeframe and also associated with inability to tolerate p.o. at this time, including after administration of IV antiemetic at [facility] this evening.  The decline in oral intake and inability to tolerate p.o. as a consequence of this intractable nausea/vomiting appears to have resulted in acute kidney injury as consequence of associated dehydration.   Appears to be as a consequence of gastroenteritis, likely viral in nature, with CT abdomen/pelvis showed multiple fluid-filled loops of small and large bowel along with evidence of mild inflammation of the pelvic small bowel loops, without any corresponding evidence of bowel obstruction, abscess, or perforation.    Of note, lipase found to be mildly elevated at 200, although CT abdomen/pelvis shows no evidence of acute pancreatitis, including no evidence of inflammatory changes or peripancreatic fluid.  Overall, the pancreatitis appears less likely at this time, although she is at risk for nonseminomatous such as a reactive process in the context of her suspected presenting acute gastroenteritis.  Today CT abdomen social/shows some mild intra and extrahepatic biliary ductal dilation/pancreatic duct dilation, without any radiographic evidence of biliary stone.  Additionally, liver enzymes are nonelevated, including no evidence of cholestatic pattern.  Overall, obstructing biliary stone is in to be less likely, rather this mild dilation of the biliary ducts appears to be more likely to be on the basis of a reactive process from gastroenteritis, as above.    Plan: Prn IV Zofran.  Prn IV Phenergan for nausea/vomiting refractory to prn Zofran.  Repeat lipase in the morning.  CMP/CBC in the morning.  Add on serum magnesium level.  Follow-up result urinalysis.  Check TSH.  Lactated Ringer's at 75 cc/h x 10 hours.  Clear liquid diet, request advance as tolerated to full regular diet.   Further evaluation management of suspected presenting acute viral gastroenteritis, as further detailed below.          #) Enteritis: In the setting of worsening 1 day of recurrent nausea/vomiting followed by development of multiple episodes of nonbloody loose, CT abdomen/pelvis suggestive of enteritis, without evidence of bowel obstruction, abscess, or perforation, as further detailed above.  The sequence of symptoms appears consistent with gastroenteritis, suspected to be on the basis of viral process timeframe of the symptoms.  However, given her history of C. difficile colitis in February 2023 along with recent completion of outpatient antibiotic course, continue to follow for results of  C. difficile PCR ordered at [facility] skin.  Additionally, GI panel by PCR is also pending at this time.  Given suspected viral process, appears hemodynamically stable, and as the patient demonstrates no fever or leukocytosis, and therefore does not meet SIRS or sepsis, will refrain from empiric initiation of IV antibiotics for now.  Plan: Follow-up results of C. difficile PCR as well as GI panel by PCR.  Gentle IV fluids overnight, as above.  Prn IV Zofran, prn IV Phenergan for nausea/vomiting refractory to prn Zofran.  Repeat lipase in the morning.  CMP/CBC in the morning.  Add on serum magnesium level.  Check TSH.                 #) Dehydration: Clinical suspicion for such, including the appearance of dry oral mucous membranes as well as laboratory findings notable for acute prerenal azotemia, w/ result of UA currently pending.  Appears to be as a consequence of concomitant increase in GI losses in the form of vomiting and nausea/vomiting/loose stool , with further exacerbation of the resultant decline in oral intake over that timeframe.  No e/o associated hypotension, however, this appears to contribute to the patient's presenting AKI, as further detailed below.   Plan: Monitor  strict I's and O's.  Daily weights.  Repeat CMP in the morning.  Gentle overnight IVF's as further detailed above.  Further evaluation and management of presenting intractable nausea/vomiting as well as suspected viral gastroenteritis, as above.  Follow-up result urinalysis.          #) Generalized weakness: Development of generalized weakness over the course of the last day, in the absence of any associated acute focal weakness.  Appears to be as a consequence of dehydration resulting from nausea/vomiting/loose stool and suspected underlying enteritis, likely viral in nature.  No overt evidence of additional underlying infectious process at this time, although urinalysis result is pending at this time.  We will also check TSH noting a documented history of acquired hypothyroidism.    Plan: Gentle overnight IV fluids, as above.  At [facility] magnesium level.  Check TSH.  Follow-up result urinalysis.  CMP/CBC in the morning.  Fall precautions.  Physical therapy consult ordered for the morning.            #) Acute Kidney Injury:  as quantified above.  Appears prerenal in nature as consequence of dehydration from increased GI losses as well as decline in oral intake over the course last day, as above.  No overt pharmacologic exacerbating factors identified at this time.  Urinalysis with microscopy ordered, with result currently pending.  Of note, today CT abdomen/pelvis showed no evidence of postrenal obstructive process.   Plan: monitor strict I's & O's and daily weights. Attempt to avoid nephrotoxic agents. Refrain from NSAIDs. Repeat CMP in the morning. Check serum magnesium level.  Follow-up result urinalysis with microscopy.  Add-on random urine sodium and random urine creatinine.  Gentle overnight IV fluids, as above.  Further evaluation and management of intractable nausea/vomiting as well as further evaluation management of suspected presenting acute viral gastroenteritis, as  above.              #) Paroxysmal atrial fibrillation: Documented history of such. In setting of CHA2DS2-VASc score of 4, there is an indication for chronic anticoagulation for thromboembolic prophylaxis.  However, does not appear that the patient is formally anticoagulated, but rather on a daily baby aspirin.  Will attempt additional chart review to ascertain rationale behind  no formal anticoagulation. Home AV nodal blocking regimen: Daily diltiazem.  Most recent echocardiogram occurred in February 2023, it was notable for LVEF 65 to 70%, no focal wall motion rallies, indeterminate diastolic parameters, normal right ventricular systolic function, and mild mitral regurgitation. Presenting EKG shows sinus rhythm without overt evidence of acute ischemic changes.   Plan: monitor strict I's & O's and daily weights. CMP/CBC in AM. Check serum mag level. Continue home AV nodal blocking regimen.  Further chart review for insight into rationale behind the absence of formal anticoagulation.            #) acquired hypothyroidism: documented h/o such, on Synthroid as outpatient.   Plan: cont home Synthroid.  Additionally, in the setting of presenting nausea/vomiting/loose stool, also check TSH.      DVT prophylaxis: SCD's   Code Status: Full code Family Communication: case d/w pt's son, as further detailed above Disposition Plan: Per Rounding Team Consults called: none;  Admission status: Inpatient     [place] DO [facility] [place]   05/09/2022, 9:57 PM

## 2022-05-10 DIAGNOSIS — N179 Acute kidney failure, unspecified: Secondary | ICD-10-CM | POA: Diagnosis not present

## 2022-05-10 DIAGNOSIS — A09 Infectious gastroenteritis and colitis, unspecified: Secondary | ICD-10-CM

## 2022-05-10 DIAGNOSIS — E860 Dehydration: Secondary | ICD-10-CM | POA: Diagnosis not present

## 2022-05-10 DIAGNOSIS — R112 Nausea with vomiting, unspecified: Secondary | ICD-10-CM | POA: Diagnosis not present

## 2022-05-10 LAB — CBC WITH DIFFERENTIAL/PLATELET
Abs Immature Granulocytes: 0.02 10*3/uL (ref 0.00–0.07)
Basophils Absolute: 0 10*3/uL (ref 0.0–0.1)
Basophils Relative: 1 %
Eosinophils Absolute: 0.2 10*3/uL (ref 0.0–0.5)
Eosinophils Relative: 6 %
HCT: 35.2 % — ABNORMAL LOW (ref 36.0–46.0)
Hemoglobin: 11.4 g/dL — ABNORMAL LOW (ref 12.0–15.0)
Immature Granulocytes: 1 %
Lymphocytes Relative: 22 %
Lymphs Abs: 0.9 10*3/uL (ref 0.7–4.0)
MCH: 31.6 pg (ref 26.0–34.0)
MCHC: 32.4 g/dL (ref 30.0–36.0)
MCV: 97.5 fL (ref 80.0–100.0)
Monocytes Absolute: 0.6 10*3/uL (ref 0.1–1.0)
Monocytes Relative: 15 %
Neutro Abs: 2.4 10*3/uL (ref 1.7–7.7)
Neutrophils Relative %: 55 %
Platelets: 189 10*3/uL (ref 150–400)
RBC: 3.61 MIL/uL — ABNORMAL LOW (ref 3.87–5.11)
RDW: 13.6 % (ref 11.5–15.5)
WBC: 4.2 10*3/uL (ref 4.0–10.5)
nRBC: 0 % (ref 0.0–0.2)

## 2022-05-10 LAB — URINALYSIS, ROUTINE W REFLEX MICROSCOPIC
Bilirubin Urine: NEGATIVE
Glucose, UA: NEGATIVE mg/dL
Ketones, ur: 5 mg/dL — AB
Nitrite: NEGATIVE
Protein, ur: 30 mg/dL — AB
Specific Gravity, Urine: >1.046 — ABNORMAL HIGH (ref 1.005–1.030)
pH: 5 (ref 5.0–8.0)

## 2022-05-10 LAB — COMPREHENSIVE METABOLIC PANEL
ALT: 17 U/L (ref 0–44)
AST: 31 U/L (ref 15–41)
Albumin: 2.9 g/dL — ABNORMAL LOW (ref 3.5–5.0)
Alkaline Phosphatase: 73 U/L (ref 38–126)
Anion gap: 5 (ref 5–15)
BUN: 14 mg/dL (ref 8–23)
CO2: 25 mmol/L (ref 22–32)
Calcium: 8.5 mg/dL — ABNORMAL LOW (ref 8.9–10.3)
Chloride: 105 mmol/L (ref 98–111)
Creatinine, Ser: 0.73 mg/dL (ref 0.44–1.00)
GFR, Estimated: >60 mL/min (ref >60)
Glucose, Bld: 84 mg/dL (ref 70–99)
Potassium: 3.9 mmol/L (ref 3.5–5.1)
Sodium: 135 mmol/L (ref 135–145)
Total Bilirubin: 0.6 mg/dL (ref 0.3–1.2)
Total Protein: 5.8 g/dL — ABNORMAL LOW (ref 6.5–8.1)

## 2022-05-10 LAB — TSH: TSH: 4.375 u[IU]/mL (ref 0.350–4.500)

## 2022-05-10 LAB — CLOSTRIDIUM DIFFICILE BY PCR, REFLEXED: Toxigenic C. Difficile by PCR: POSITIVE — AB

## 2022-05-10 LAB — MAGNESIUM: Magnesium: 2 mg/dL (ref 1.7–2.4)

## 2022-05-10 LAB — C DIFFICILE QUICK SCREEN W PCR REFLEX
C Diff antigen: POSITIVE — AB
C Diff toxin: NEGATIVE

## 2022-05-10 LAB — MRSA NEXT GEN BY PCR, NASAL: MRSA by PCR Next Gen: NOT DETECTED

## 2022-05-10 LAB — SODIUM, URINE, RANDOM: Sodium, Ur: 128 mmol/L

## 2022-05-10 LAB — CREATININE, URINE, RANDOM: Creatinine, Urine: 143 mg/dL

## 2022-05-10 LAB — LIPASE, BLOOD: Lipase: 97 U/L — ABNORMAL HIGH (ref 11–51)

## 2022-05-10 MED ORDER — VANCOMYCIN HCL 125 MG PO CAPS: 125.0000 mg | ORAL_CAPSULE | ORAL | Status: DC

## 2022-05-10 MED ORDER — LACTATED RINGERS IV SOLN: INTRAVENOUS | Status: AC

## 2022-05-10 MED ORDER — MEMANTINE HCL 10 MG PO TABS
5.0000 mg | ORAL_TABLET | Freq: Two times a day (BID) | ORAL | Status: DC
  Administered 2022-05-10 – 2022-05-14 (×8): 5 mg via ORAL
  Filled 2022-05-10 (×8): qty 1

## 2022-05-10 MED ORDER — LACTATED RINGERS IV SOLN: INTRAVENOUS | Status: DC

## 2022-05-10 MED ORDER — VANCOMYCIN HCL 125 MG PO CAPS: 125.0000 mg | ORAL_CAPSULE | Freq: Every day | ORAL | Status: DC

## 2022-05-10 MED ORDER — VANCOMYCIN HCL 125 MG PO CAPS: 125.0000 mg | ORAL_CAPSULE | Freq: Two times a day (BID) | ORAL | Status: DC

## 2022-05-10 MED ORDER — VANCOMYCIN HCL 125 MG PO CAPS
125.0000 mg | ORAL_CAPSULE | Freq: Four times a day (QID) | ORAL | Status: DC
  Administered 2022-05-10 – 2022-05-14 (×17): 125 mg via ORAL
  Filled 2022-05-10 (×18): qty 1

## 2022-05-10 NOTE — Progress Notes (Signed)
PROGRESS NOTE   Mallory Grant  PPJ:[id number] DOB: 03-12-1929 DOA: 05/09/2022 PCP: Shirline Frees, MD   Date of Service: the patient was seen and examined on 05/10/2022  Brief Narrative:  88 y.o. female with medical history significant for paroxysmal atrial fibrillation, not chronically anticoagulated, C. difficile colitis in February 2023, Alzheimer's dementia, acquired hypothyroidism who presented to [place] emergency department with a 5-day history of diarrhea and intractable nausea and vomiting on 11/17.    Of note, patient was recently treated for a urinary tract infection with a course of oral antibiotics several weeks ago.  Upon evaluation in the emergency department due to ongoing intractable symptoms of nausea and vomiting with clinical evidence of volume depletion and mild acute kidney injury the hospitalist group was called and patient was excepted for transfer to [facility] long hospital for continued medical care.    Patient was hospitalized and placed on intravenous volume resuscitation with isotonic fluids.  Patient was provided with as needed intravenous antiemetics.  Considering concurrent mild acute kidney injury, electrolytes and renal function were monitored closely.    CT imaging of the abdomen pelvis was consistent with enteritis with multiple fluid-filled loops. Initial C. difficile screening was positive for antigen but negative for toxin revealing an equivocal test.  With this in mind, patient was empirically placed on oral vancomycin therapy regardless due to patient's known history of C. difficile infection and recent oral antibiotics which may have predisposed her to have a recurrence.   Assessment and Plan: Problem  Acute Infectious Diarrhea  While initial C. difficile screening is equivocal, empirically treating for recurrent C. difficile colitis considering patient's substantial symptoms, known history of C. difficile in the past and high likelihood  of recurrence with recent use of antibiotics Patient's been placed on oral vancomycin Intravenous volume resuscitation with isotonic fluids Monitoring renal function and electrolytes with serial chemistries Following up on confirmation C. difficile PCR testing  Supportive care otherwise Clear liquid diet for now which will be advanced as patient clinically improved   Intractable Nausea and Vomiting  Please see assessment and plan above   Aki (Acute Kidney Injury) (Hcc)  Patient presenting with mild acute kidney injury secondary to volume depletion Hydrating patient with intravenous isotonic fluids Strict input and output monitoring Avoid nephrotoxic agents if at all possible Monitor renal function and electrolytes with serial chemistry   Dehydration  Continue intravenous volume resuscitation as noted above   Generalized Weakness  Secondary to volume depletion and active infection Treating underlying infection PT evaluation   Paroxysmal Atrial Fibrillation (Hcc)  Continue home regimen of diltiazem Not on anticoagulation per home regimen Rate controlled, serial EKGs as needed   Acquired Hypothyroidism  Continue home regimen of Synthroid      Subjective:  Patient complaining of ongoing bouts of watery diarrhea.  These episodes are severe in intensity and associated with severe generalized weakness that is worse with exertion and improved with rest.    Physical Exam:  Vitals:   05/10/22 0120 05/10/22 0607 05/10/22 0826 05/10/22 1429  BP: (!) 121/57 (!) 113/54 (!) 120/56 117/62  Pulse: 72 66 69 65  Resp: '18 16 18 17  '$ Temp: 98.6 F (37 C) 99.1 F (37.3 C) 98.3 F (36.8 C) 98.5 F (36.9 C)  TempSrc: Oral Oral Oral Oral  SpO2: 93% 92% 90% 95%  Weight:      Height:        Constitutional: Awake alert and oriented x3, no associated distress.  Skin: no rashes, no lesions, poor skin turgor noted. Eyes: Pupils are equally reactive to light.  No evidence of  scleral icterus or conjunctival pallor.  ENMT: Moist mucous membranes noted.  Posterior pharynx clear of any exudate or lesions.   Respiratory: clear to auscultation bilaterally, no wheezing, no crackles. Normal respiratory effort. No accessory muscle use.  Cardiovascular: Regular rate and rhythm, no murmurs / rubs / gallops. No extremity edema. 2+ pedal pulses. No carotid bruits.  Abdomen: Hyper active bowel sounds.  Soft abdomen.  Mild generalized tenderness.  No evidence of intra-abdominal masses.  Positive bowel sounds noted in all quadrants.   Musculoskeletal: No joint deformity upper and lower extremities. Good ROM, no contractures. Normal muscle tone.    Data Reviewed:  I have personally reviewed and interpreted labs, imaging.  Significant findings are   CBC: Recent Labs  Lab 05/09/22 1537 05/10/22 0610  WBC 6.6 4.2  NEUTROABS  --  2.4  HGB 14.4 11.4*  HCT 44.3 35.2*  MCV 96.1 97.5  PLT 237 563   Basic Metabolic Panel: Recent Labs  Lab 05/09/22 1537 05/10/22 0610  NA 135 135  K 3.6 3.9  CL 98 105  CO2 27 25  GLUCOSE 116* 84  BUN 20 14  CREATININE 1.01* 0.73  CALCIUM 8.9 8.5*  MG  --  2.0   GFR: Estimated Creatinine Clearance: 43.8 mL/min (by C-G formula based on SCr of 0.73 mg/dL). Liver Function Tests: Recent Labs  Lab 05/09/22 1537 05/10/22 0610  AST 39 31  ALT 19 17  ALKPHOS 105 73  BILITOT 0.8 0.6  PROT 8.0 5.8*  ALBUMIN 3.8 2.9*     Code Status:  DNR.  Code status decision has been confirmed with: son Family Communication: Plan of care discussed with son via phone conversation.   Severity of Illness:  The appropriate patient status for this patient is INPATIENT. Inpatient status is judged to be reasonable and necessary in order to provide the required intensity of service to ensure the patient's safety. The patient's presenting symptoms, physical exam findings, and initial radiographic and laboratory data in the context of their chronic  comorbidities is felt to place them at high risk for further clinical deterioration. Furthermore, it is not anticipated that the patient will be medically stable for discharge from the hospital within 2 midnights of admission.   * I certify that at the point of admission it is my clinical judgment that the patient will require inpatient hospital care spanning beyond 2 midnights from the point of admission due to high intensity of service, high risk for further deterioration and high frequency of surveillance required.*  Time spent:  53 minutes  Author:  Vernelle Emerald MD  05/10/2022 5:22 PM

## 2022-05-10 NOTE — Progress Notes (Signed)
Pt arrived to the unit from outside facility, pt alert and oriented to self. Pt;s son at bedside to assist with admission. Safety precautions initiated. Pt had no episodes of emesis or diarrhea this shift. Enteric precautions initiated per orders, stool collection pending.

## 2022-05-10 NOTE — Evaluation (Signed)
Physical Therapy Evaluation Patient Details Name: Mallory Grant MRN: [id number] DOB: 1929/05/22 Today's Date: 05/10/2022  History of Present Illness  Mallory Grant is a 88 y.o. female with medical history significant for paroxysmal atrial fibrillation, not chronically anticoagulated, C. difficile colitis in February 2023, Alzheimer's dementia, acquired hypothyroidism, who is admitted to [facility] on 05/09/2022 by way of transfer from [place] with intractable nausea/vomiting/diarrhea. Patient resides in ALF  Clinical Impression  Pt admitted with above diagnosis.  Pt currently with functional limitations due to the deficits listed below (see PT Problem List). Pt will benefit from skilled PT to increase their independence and safety with mobility to allow discharge to the venue listed below.     Patient's son present  to provide PLOF at ALF. Patient pleasant, did require mod assost of 2 persons to stand and step to recliner, initially"freezing" feet with gradual  ability to take steps using RW. Per son, patient will return to ALF.     Recommendations for follow up therapy are one component of a multi-disciplinary discharge planning process, led by the attending physician.  Recommendations may be updated based on patient status, additional functional criteria and insurance authorization.  Follow Up Recommendations Home health PT (at  ALF)      Assistance Recommended at Discharge Frequent or constant Supervision/Assistance  Patient can return home with the following  A lot of help with walking and/or transfers;A lot of help with bathing/dressing/bathroom;Assistance with cooking/housework;Assist for transportation;Help with stairs or ramp for entrance    Equipment Recommendations None recommended by PT  Recommendations for Other Services       Functional Status Assessment Patient has had a recent decline in their functional status and demonstrates the ability to make  significant improvements in function in a reasonable and predictable amount of time.     Precautions / Restrictions Precautions Precautions: Fall      Mobility  Bed Mobility Overal bed mobility: Needs Assistance Bed Mobility: Supine to Sit     Supine to sit: Min assist     General bed mobility comments: assist with trunk    Transfers Overall transfer level: Needs assistance Equipment used: Rolling walker (2 wheels) Transfers: Sit to/from Stand, Bed to chair/wheelchair/BSC Sit to Stand: Mod assist, +2 physical assistance   Step pivot transfers: Mod assist, +2 safety/equipment       General transfer comment: multimodal cues  for standing and step to recliner. decreased stepping    Ambulation/Gait                  Stairs            Wheelchair Mobility    Modified Rankin (Stroke Patients Only)       Balance Overall balance assessment: Needs assistance Sitting-balance support: Bilateral upper extremity supported, Feet supported Sitting balance-Leahy Scale: Fair     Standing balance support: Bilateral upper extremity supported, During functional activity, Reliant on assistive device for balance Standing balance-Leahy Scale: Poor                               Pertinent Vitals/Pain Pain Assessment Pain Assessment: No/denies pain    Home Living Family/patient expects to be discharged to:: Assisted living                 Home Equipment: Rollator (4 wheels);Rolling Walker (2 wheels) Additional Comments: per son uses rollator, resides in  memory care ALF  Prior Function Prior Level of Function : Needs assist  Cognitive Assist : Mobility (cognitive);ADLs (cognitive) Mobility (Cognitive): Intermittent cues ADLs (Cognitive): Intermittent cues Physical Assist : Mobility (physical) Mobility (physical): Bed mobility   Mobility Comments: uses rollator with assistance       Hand Dominance   Dominant Hand: Right     Extremity/Trunk Assessment   Upper Extremity Assessment Upper Extremity Assessment: Overall WFL for tasks assessed    Lower Extremity Assessment Lower Extremity Assessment: Generalized weakness    Cervical / Trunk Assessment Cervical / Trunk Assessment: Kyphotic  Communication   Communication: Receptive difficulties  Cognition Arousal/Alertness: Awake/alert Behavior During Therapy: WFL for tasks assessed/performed Overall Cognitive Status: History of cognitive impairments - at baseline                                          General Comments      Exercises     Assessment/Plan    PT Assessment Patient needs continued PT services  PT Problem List Decreased strength;Decreased activity tolerance;Decreased mobility       PT Treatment Interventions DME instruction;Therapeutic activities;Gait training;Functional mobility training;Patient/family education    PT Goals (Current goals can be found in the Care Plan section)  Acute Rehab PT Goals Patient Stated Goal: per son, to go back to ALF PT Goal Formulation: With patient/family Time For Goal Achievement: 05/24/22 Potential to Achieve Goals: Fair    Frequency Min 2X/week     Co-evaluation               AM-PAC PT "6 Clicks" Mobility  Outcome Measure Help needed turning from your back to your side while in a flat bed without using bedrails?: A Lot Help needed moving from lying on your back to sitting on the side of a flat bed without using bedrails?: A Lot Help needed moving to and from a bed to a chair (including a wheelchair)?: A Lot Help needed standing up from a chair using your arms (e.g., wheelchair or bedside chair)?: A Lot Help needed to walk in hospital room?: Total Help needed climbing 3-5 steps with a railing? : Total 6 Click Score: 10    End of Session   Activity Tolerance: Patient tolerated treatment well Patient left: in chair;with call bell/phone within reach;with chair alarm  set;with family/visitor present;with nursing/sitter in room Nurse Communication: Mobility status PT Visit Diagnosis: Unsteadiness on feet (R26.81);Difficulty in walking, not elsewhere classified (R26.2)    Time: 8182-9937 PT Time Calculation (min) (ACUTE ONLY): 10 min   Charges:   PT Evaluation $PT Eval Low Complexity: 1 Low          Tresa Endo PT Acute Rehabilitation Services Office [phone] Weekend [phone]   Claretha Cooper 05/10/2022, 1:12 PM

## 2022-05-10 NOTE — Hospital Course (Addendum)
88 y.o. female with medical history significant for paroxysmal atrial fibrillation, not chronically anticoagulated, C. difficile colitis in February 2023, Alzheimer's dementia, acquired hypothyroidism who presented to [place] emergency department with a 5-day history of diarrhea and intractable nausea and vomiting on 11/17.    Of note, patient was recently treated for a urinary tract infection with a course of oral antibiotics several weeks ago.  Upon evaluation in the emergency department due to ongoing intractable symptoms of nausea and vomiting with clinical evidence of volume depletion and mild acute kidney injury the hospitalist group was called and patient was excepted for transfer to [facility] long hospital for continued medical care.    Patient was hospitalized and placed on intravenous volume resuscitation with isotonic fluids.  Patient was provided with as needed intravenous antiemetics.  Considering concurrent mild acute kidney injury, electrolytes and renal function were monitored closely.    CT imaging of the abdomen pelvis was consistent with enteritis with multiple fluid-filled loops. Initial C. difficile screening was positive for antigen but negative for toxin.  C. difficile PCR was positive as well.  Considering patient's Stansel symptoms of diarrhea, patient was empirically placed on oral vancomycin therapy regardless due to patient's known history of C. difficile infection and recent oral antibiotics which may have predisposed her to have a recurrence.  Patient exhibited gradual clinical improvement in the days that followed on oral vancomycin.

## 2022-05-11 DIAGNOSIS — N179 Acute kidney failure, unspecified: Secondary | ICD-10-CM | POA: Diagnosis not present

## 2022-05-11 DIAGNOSIS — A0472 Enterocolitis due to Clostridium difficile, not specified as recurrent: Secondary | ICD-10-CM

## 2022-05-11 DIAGNOSIS — R112 Nausea with vomiting, unspecified: Secondary | ICD-10-CM | POA: Diagnosis not present

## 2022-05-11 DIAGNOSIS — E860 Dehydration: Secondary | ICD-10-CM | POA: Diagnosis not present

## 2022-05-11 LAB — CBC WITH DIFFERENTIAL/PLATELET
Abs Immature Granulocytes: 0.04 10*3/uL (ref 0.00–0.07)
Basophils Absolute: 0 10*3/uL (ref 0.0–0.1)
Basophils Relative: 0 %
Eosinophils Absolute: 0.1 10*3/uL (ref 0.0–0.5)
Eosinophils Relative: 3 %
HCT: 36.2 % (ref 36.0–46.0)
Hemoglobin: 11.8 g/dL — ABNORMAL LOW (ref 12.0–15.0)
Immature Granulocytes: 1 %
Lymphocytes Relative: 24 %
Lymphs Abs: 1.3 10*3/uL (ref 0.7–4.0)
MCH: 31.3 pg (ref 26.0–34.0)
MCHC: 32.6 g/dL (ref 30.0–36.0)
MCV: 96 fL (ref 80.0–100.0)
Monocytes Absolute: 0.6 10*3/uL (ref 0.1–1.0)
Monocytes Relative: 12 %
Neutro Abs: 3.3 10*3/uL (ref 1.7–7.7)
Neutrophils Relative %: 60 %
Platelets: 198 10*3/uL (ref 150–400)
RBC: 3.77 MIL/uL — ABNORMAL LOW (ref 3.87–5.11)
RDW: 13.2 % (ref 11.5–15.5)
WBC: 5.4 10*3/uL (ref 4.0–10.5)
nRBC: 0 % (ref 0.0–0.2)

## 2022-05-11 LAB — MAGNESIUM: Magnesium: 1.9 mg/dL (ref 1.7–2.4)

## 2022-05-11 LAB — COMPREHENSIVE METABOLIC PANEL
ALT: 19 U/L (ref 0–44)
AST: 32 U/L (ref 15–41)
Albumin: 3 g/dL — ABNORMAL LOW (ref 3.5–5.0)
Alkaline Phosphatase: 69 U/L (ref 38–126)
Anion gap: 6 (ref 5–15)
BUN: 8 mg/dL (ref 8–23)
CO2: 25 mmol/L (ref 22–32)
Calcium: 8.4 mg/dL — ABNORMAL LOW (ref 8.9–10.3)
Chloride: 102 mmol/L (ref 98–111)
Creatinine, Ser: 0.73 mg/dL (ref 0.44–1.00)
GFR, Estimated: >60 mL/min (ref >60)
Glucose, Bld: 89 mg/dL (ref 70–99)
Potassium: 3.6 mmol/L (ref 3.5–5.1)
Sodium: 133 mmol/L — ABNORMAL LOW (ref 135–145)
Total Bilirubin: 0.8 mg/dL (ref 0.3–1.2)
Total Protein: 6 g/dL — ABNORMAL LOW (ref 6.5–8.1)

## 2022-05-11 NOTE — Progress Notes (Signed)
PROGRESS NOTE   Mallory Grant  YYT:[id number] DOB: 08-29-1928 DOA: 05/09/2022 PCP: Shirline Frees, MD   Date of Service: the patient was seen and examined on 05/11/2022  Brief Narrative:  88 y.o. female with medical history significant for paroxysmal atrial fibrillation, not chronically anticoagulated, C. difficile colitis in February 2023, Alzheimer's dementia, acquired hypothyroidism who presented to [place] emergency department with a 5-day history of diarrhea and intractable nausea and vomiting on 11/17.    Of note, patient was recently treated for a urinary tract infection with a course of oral antibiotics several weeks ago.  Upon evaluation in the emergency department due to ongoing intractable symptoms of nausea and vomiting with clinical evidence of volume depletion and mild acute kidney injury the hospitalist group was called and patient was excepted for transfer to [facility] long hospital for continued medical care.    Patient was hospitalized and placed on intravenous volume resuscitation with isotonic fluids.  Patient was provided with as needed intravenous antiemetics.  Considering concurrent mild acute kidney injury, electrolytes and renal function were monitored closely.    CT imaging of the abdomen pelvis was consistent with enteritis with multiple fluid-filled loops. Initial C. difficile screening was positive for antigen but negative for toxin revealing an equivocal test.  With this in mind, patient was empirically placed on oral vancomycin therapy regardless due to patient's known history of C. difficile infection and recent oral antibiotics which may have predisposed her to have a recurrence.   Assessment and Plan: Problem  Clostridioides difficile diarrhea and colitis  While initial C. difficile screening was positive for antigen but negative for toxin, C. difficile PCR testing ended up being positive as well  Treating with oral vancomycin due to history of  C. difficile colitis in the past, recent course of oral antibiotics for UTI and presentation in the emergency department with substantial diarrhea  Now, slowly clinically improving  Discontinuing intravenous antibiotics today Advancing to full liquid diet today. Monitoring renal function and electrolytes with serial chemistries Supportive care otherwise   Intractable Nausea and Vomiting  Please see assessment and plan above   Aki (Acute Kidney Injury) (Hcc)  Patient presenting with mild acute kidney injury secondary to volume depletion Now resolved with intravenous volume resuscitation. Strict input and output monitoring Avoid nephrotoxic agents if at all possible Monitor renal function and electrolytes with serial chemistries   Dehydration  Patient has been transitioned off of intravenous fluids today.   Generalized Weakness  Secondary to volume depletion and active infection Treating underlying infection PT recommending home health PT at time of discharge.   Paroxysmal Atrial Fibrillation (Hcc)  Continue home regimen of diltiazem Not on anticoagulation per home regimen Rate controlled, serial EKGs as needed   Acquired Hypothyroidism  Continue home regimen of Synthroid      Subjective:  Patient reports her diarrhea is improving.  Patient denies associated abdominal pain.  Patient states that she is hungry   Physical Exam:  Vitals:   05/10/22 0607 05/10/22 0826 05/10/22 1429 05/11/22 0425  BP: (!) 113/54 (!) 120/56 117/62 (!) 117/54  Pulse: 66 69 65 63  Resp: '16 18 17 18  '$ Temp: 99.1 F (37.3 C) 98.3 F (36.8 C) 98.5 F (36.9 C) 99.8 F (37.7 C)  TempSrc: Oral Oral Oral Oral  SpO2: 92% 90% 95% 91%  Weight:      Height:        Constitutional: Awake alert and oriented x3, no associated distress.   Skin:  no rashes, no lesions, poor skin turgor noted. Eyes: Pupils are equally reactive to light.  No evidence of scleral icterus or conjunctival pallor.   ENMT: Moist mucous membranes noted.  Posterior pharynx clear of any exudate or lesions.   Respiratory: clear to auscultation bilaterally, no wheezing, no crackles. Normal respiratory effort. No accessory muscle use.  Cardiovascular: Regular rate and rhythm, no murmurs / rubs / gallops. No extremity edema. 2+ pedal pulses. No carotid bruits.  Abdomen: Hyperactive bowel sounds noted.  Abdomen is soft and nontender.   No evidence of intra-abdominal masses.  Positive bowel sounds noted in all quadrants.   Musculoskeletal: No joint deformity upper and lower extremities. Good ROM, no contractures. Normal muscle tone.    Data Reviewed:  I have personally reviewed and interpreted labs, imaging.  Significant findings are   CBC: Recent Labs  Lab 05/09/22 1537 05/10/22 0610 05/11/22 0623  WBC 6.6 4.2 5.4  NEUTROABS  --  2.4 3.3  HGB 14.4 11.4* 11.8*  HCT 44.3 35.2* 36.2  MCV 96.1 97.5 96.0  PLT 237 189 939    Basic Metabolic Panel: Recent Labs  Lab 05/09/22 1537 05/10/22 0610 05/11/22 0623  NA 135 135 133*  K 3.6 3.9 3.6  CL 98 105 102  CO2 '27 25 25  '$ GLUCOSE 116* 84 89  BUN '20 14 8  '$ CREATININE 1.01* 0.73 0.73  CALCIUM 8.9 8.5* 8.4*  MG  --  2.0 1.9    GFR: Estimated Creatinine Clearance: 43.8 mL/min (by C-G formula based on SCr of 0.73 mg/dL). Liver Function Tests: Recent Labs  Lab 05/09/22 1537 05/10/22 0610 05/11/22 0623  AST 39 31 32  ALT '19 17 19  '$ ALKPHOS 105 73 69  BILITOT 0.8 0.6 0.8  PROT 8.0 5.8* 6.0*  ALBUMIN 3.8 2.9* 3.0*      Code Status:  DNR.  Code status decision has been confirmed with: son   Severity of Illness:  The appropriate patient status for this patient is INPATIENT. Inpatient status is judged to be reasonable and necessary in order to provide the required intensity of service to ensure the patient's safety. The patient's presenting symptoms, physical exam findings, and initial radiographic and laboratory data in the context of their  chronic comorbidities is felt to place them at high risk for further clinical deterioration. Furthermore, it is not anticipated that the patient will be medically stable for discharge from the hospital within 2 midnights of admission.   * I certify that at the point of admission it is my clinical judgment that the patient will require inpatient hospital care spanning beyond 2 midnights from the point of admission due to high intensity of service, high risk for further deterioration and high frequency of surveillance required.*  Time spent:  45 minutes  Author:  Vernelle Emerald MD  05/11/2022 9:23 AM

## 2022-05-11 NOTE — Progress Notes (Signed)
Pt keeps removing PIV. Pt is not receiving ant medications IV. She Is alert to self easily re-oriented but seems PIV causes her duress.

## 2022-05-12 ENCOUNTER — Other Ambulatory Visit (HOSPITAL_COMMUNITY): Payer: Self-pay

## 2022-05-12 DIAGNOSIS — A0472 Enterocolitis due to Clostridium difficile, not specified as recurrent: Secondary | ICD-10-CM | POA: Diagnosis not present

## 2022-05-12 DIAGNOSIS — E860 Dehydration: Secondary | ICD-10-CM | POA: Diagnosis not present

## 2022-05-12 DIAGNOSIS — N179 Acute kidney failure, unspecified: Secondary | ICD-10-CM | POA: Diagnosis not present

## 2022-05-12 DIAGNOSIS — R112 Nausea with vomiting, unspecified: Secondary | ICD-10-CM | POA: Diagnosis not present

## 2022-05-12 LAB — COMPREHENSIVE METABOLIC PANEL
ALT: 17 U/L (ref 0–44)
AST: 28 U/L (ref 15–41)
Albumin: 3 g/dL — ABNORMAL LOW (ref 3.5–5.0)
Alkaline Phosphatase: 76 U/L (ref 38–126)
Anion gap: 8 (ref 5–15)
BUN: <5 mg/dL — ABNORMAL LOW (ref 8–23)
CO2: 30 mmol/L (ref 22–32)
Calcium: 8.9 mg/dL (ref 8.9–10.3)
Chloride: 101 mmol/L (ref 98–111)
Creatinine, Ser: 0.71 mg/dL (ref 0.44–1.00)
GFR, Estimated: >60 mL/min (ref >60)
Glucose, Bld: 92 mg/dL (ref 70–99)
Potassium: 3.9 mmol/L (ref 3.5–5.1)
Sodium: 139 mmol/L (ref 135–145)
Total Bilirubin: 0.7 mg/dL (ref 0.3–1.2)
Total Protein: 6.2 g/dL — ABNORMAL LOW (ref 6.5–8.1)

## 2022-05-12 LAB — CBC WITH DIFFERENTIAL/PLATELET
Abs Immature Granulocytes: 0.01 10*3/uL (ref 0.00–0.07)
Basophils Absolute: 0 10*3/uL (ref 0.0–0.1)
Basophils Relative: 1 %
Eosinophils Absolute: 0.3 10*3/uL (ref 0.0–0.5)
Eosinophils Relative: 8 %
HCT: 38.4 % (ref 36.0–46.0)
Hemoglobin: 12.7 g/dL (ref 12.0–15.0)
Immature Granulocytes: 0 %
Lymphocytes Relative: 42 %
Lymphs Abs: 1.6 10*3/uL (ref 0.7–4.0)
MCH: 31.1 pg (ref 26.0–34.0)
MCHC: 33.1 g/dL (ref 30.0–36.0)
MCV: 94.1 fL (ref 80.0–100.0)
Monocytes Absolute: 0.4 10*3/uL (ref 0.1–1.0)
Monocytes Relative: 11 %
Neutro Abs: 1.5 10*3/uL — ABNORMAL LOW (ref 1.7–7.7)
Neutrophils Relative %: 38 %
Platelets: 225 10*3/uL (ref 150–400)
RBC: 4.08 MIL/uL (ref 3.87–5.11)
RDW: 13 % (ref 11.5–15.5)
WBC: 3.8 10*3/uL — ABNORMAL LOW (ref 4.0–10.5)
nRBC: 0 % (ref 0.0–0.2)

## 2022-05-12 LAB — MAGNESIUM: Magnesium: 2.1 mg/dL (ref 1.7–2.4)

## 2022-05-12 MED ORDER — ADULT MULTIVITAMIN W/MINERALS CH
1.0000 | ORAL_TABLET | Freq: Every day | ORAL | Status: DC
  Administered 2022-05-12 – 2022-05-14 (×3): 1 via ORAL
  Filled 2022-05-12 (×3): qty 1

## 2022-05-12 MED ORDER — FLUOXETINE HCL 10 MG PO CAPS
10.0000 mg | ORAL_CAPSULE | Freq: Every day | ORAL | Status: DC
  Administered 2022-05-12 – 2022-05-14 (×3): 10 mg via ORAL
  Filled 2022-05-12 (×3): qty 1

## 2022-05-12 MED ORDER — BANATROL TF EN LIQD
60.0000 mL | Freq: Two times a day (BID) | ENTERAL | Status: DC
  Filled 2022-05-12 (×5): qty 60

## 2022-05-12 MED ORDER — BOOST / RESOURCE BREEZE PO LIQD CUSTOM
1.0000 | Freq: Three times a day (TID) | ORAL | Status: DC
  Administered 2022-05-12 – 2022-05-13 (×4): 1 via ORAL

## 2022-05-12 NOTE — Care Management Important Message (Signed)
Important Message  Patient Details IM Letter placed in Patient's room. Name: Mallory Grant MRN: [id number] Date of Birth: [birth year]   Medicare Important Message Given:  Yes     Kerin Salen 05/12/2022, 11:14 AM

## 2022-05-12 NOTE — Progress Notes (Signed)
PROGRESS NOTE   Mallory Grant  JME:[id number] DOB: 05/14/1929 DOA: 05/09/2022 PCP: Shirline Frees, MD   Date of Service: the patient was seen and examined on 05/12/2022  Brief Narrative:  88 y.o. female with medical history significant for paroxysmal atrial fibrillation, not chronically anticoagulated, C. difficile colitis in February 2023, Alzheimer's dementia, acquired hypothyroidism who presented to [facility] emergency department with a 5-day history of diarrhea and intractable nausea and vomiting on 11/17.    Of note, patient was recently treated for a urinary tract infection with a course of oral antibiotics several weeks ago.  Upon evaluation in the emergency department due to ongoing intractable symptoms of nausea and vomiting with clinical evidence of volume depletion and mild acute kidney injury the hospitalist group was called and patient was excepted for transfer to [facility] long hospital for continued medical care.    Patient was hospitalized and placed on intravenous volume resuscitation with isotonic fluids.  Patient was provided with as needed intravenous antiemetics.  Considering concurrent mild acute kidney injury, electrolytes and renal function were monitored closely.    CT imaging of the abdomen pelvis was consistent with enteritis with multiple fluid-filled loops. Initial C. difficile screening was positive for antigen but negative for toxin.  C. difficile PCR was positive as well.  Considering patient's Stansel symptoms of diarrhea, patient was empirically placed on oral vancomycin therapy regardless due to patient's known history of C. difficile infection and recent oral antibiotics which may have predisposed her to have a recurrence.  Patient exhibited gradual clinical improvement in the days that followed on oral vancomycin.   Assessment and Plan: Problem  Clostridioides difficile diarrhea and colitis  Slow clinical improvement in frequency of  stools. Obtaining GI pathogen panel and stool cultures to expand work-up Continuing to treat with oral vancomycin slowly clinically improving  Intravenous fluids discontinued on 11/19 Advancing to bland diet on 11/21 Monitoring renal function and electrolytes with serial chemistries Supportive care otherwise   Intractable Nausea and Vomiting  Please see assessment and plan above   Aki (Acute Kidney Injury) (Hcc)  Patient presenting with mild acute kidney injury secondary to volume depletion Now resolved with intravenous volume resuscitation. Continuing to monitor renal function and electrolytes with serial chemistries   Dehydration  Patient has been transitioned off of intravenous fluids on the Levan/19   Generalized Weakness  Secondary to volume depletion and active infection Treating underlying infection PT recommending home health PT at time of discharge.   Paroxysmal Atrial Fibrillation (Hcc)  Continue home regimen of diltiazem Not on anticoagulation per home regimen Rate controlled, serial EKGs as needed   Acquired Hypothyroidism  Continue home regimen of Synthroid      Subjective:  Patient reports she is still experiencing episodes of diarrhea although this is continuing to improve.  Patient denies any associated abdominal pain.    Physical Exam:  Vitals:   05/12/22 0500 05/12/22 0606 05/12/22 1028 05/12/22 1406  BP:  127/65 117/65 115/61  Pulse:  60 60 66  Resp:  17  18  Temp:  98.5 F (36.9 C)  98.2 F (36.8 C)  TempSrc:  Oral  Oral  SpO2:  92%  94%  Weight: 62.3 kg     Height:        Constitutional: Awake alert and oriented x3, no associated distress.   Skin: no rashes, no lesions, poor skin turgor noted. Eyes: Pupils are equally reactive to light.  No evidence of scleral icterus or conjunctival pallor.  ENMT: Moist mucous membranes noted.  Posterior pharynx clear of any exudate or lesions.   Respiratory: clear to auscultation bilaterally, no  wheezing, no crackles. Normal respiratory effort. No accessory muscle use.  Cardiovascular: Regular rate and rhythm, no murmurs / rubs / gallops. No extremity edema. 2+ pedal pulses. No carotid bruits.  Abdomen: Bowel sounds continue to be hyperactive albeit much less so than yesterday.  Abdomen is soft and nontender.   No evidence of intra-abdominal masses.  Positive bowel sounds noted in all quadrants.   Musculoskeletal: No joint deformity upper and lower extremities. Good ROM, no contractures. Normal muscle tone.    Data Reviewed:  I have personally reviewed and interpreted labs, imaging.  Significant findings are   CBC: Recent Labs  Lab 05/09/22 1537 05/10/22 0610 05/11/22 0623 05/12/22 0727  WBC 6.6 4.2 5.4 3.8*  NEUTROABS  --  2.4 3.3 1.5*  HGB 14.4 11.4* 11.8* 12.7  HCT 44.3 35.2* 36.2 38.4  MCV 96.1 97.5 96.0 94.1  PLT 237 189 198 811   Basic Metabolic Panel: Recent Labs  Lab 05/09/22 1537 05/10/22 0610 05/11/22 0623 05/12/22 0727  NA 135 135 133* 139  K 3.6 3.9 3.6 3.9  CL 98 105 102 101  CO2 '27 25 25 30  '$ GLUCOSE 116* 84 89 92  BUN '20 14 8 '$ <5*  CREATININE 1.01* 0.73 0.73 0.71  CALCIUM 8.9 8.5* 8.4* 8.9  MG  --  2.0 1.9 2.1   GFR: Estimated Creatinine Clearance: 43.8 mL/min (by C-G formula based on SCr of 0.71 mg/dL). Liver Function Tests: Recent Labs  Lab 05/09/22 1537 05/10/22 0610 05/11/22 0623 05/12/22 0727  AST 39 31 32 28  ALT '19 17 19 17  '$ ALKPHOS 105 73 69 76  BILITOT 0.8 0.6 0.8 0.7  PROT 8.0 5.8* 6.0* 6.2*  ALBUMIN 3.8 2.9* 3.0* 3.0*     Code Status:  DNR.  Code status decision has been confirmed with: son   Severity of Illness:  The appropriate patient status for this patient is INPATIENT. Inpatient status is judged to be reasonable and necessary in order to provide the required intensity of service to ensure the patient's safety. The patient's presenting symptoms, physical exam findings, and initial radiographic and laboratory data  in the context of their chronic comorbidities is felt to place them at high risk for further clinical deterioration. Furthermore, it is not anticipated that the patient will be medically stable for discharge from the hospital within 2 midnights of admission.   * I certify that at the point of admission it is my clinical judgment that the patient will require inpatient hospital care spanning beyond 2 midnights from the point of admission due to high intensity of service, high risk for further deterioration and high frequency of surveillance required.*  Time spent:  47 minutes  Author:  Vernelle Emerald MD  05/12/2022 7:23 PM

## 2022-05-12 NOTE — Progress Notes (Signed)
Initial Nutrition Assessment  INTERVENTION:   -Banatrol BID, provides 45 kcals, 2g protein and 5g soluble fiber to aid in diarrhea  -Multivitamin with minerals daily  -Boost Breeze po TID, each supplement provides 250 kcal and 9 grams of protein   NUTRITION DIAGNOSIS:   Increased nutrient needs related to acute illness, nausea, vomiting (c.diff) as evidenced by estimated needs.  GOAL:   Patient will meet greater than or equal to 90% of their needs  MONITOR:   PO intake, Supplement acceptance, Labs, Weight trends, I & O's  REASON FOR ASSESSMENT:   Consult Assessment of nutrition requirement/status  ASSESSMENT:   88 y.o. female with medical history significant for paroxysmal atrial fibrillation, not chronically anticoagulated, C. difficile colitis in February 2023, Alzheimer's dementia, acquired hypothyroidism who presented to [place] emergency department with a 5-day history of diarrhea and intractable nausea and vomiting on 11/17.  Patient reporting having N/V for 5 days PTA. Pt tested positive for c.diff antigen as well. On full liquids, consuming ~15% of trays. Will order Boost Breeze supplements for additional kcals and protein. Will order Banatrol to help with diarrhea.  Per weight records, pt has lost 7 lbs since 3/13 (4% wt loss x 8 months, insignificant for time frame).   Medications reviewed.  Labs reviewed.   NUTRITION - FOCUSED PHYSICAL EXAM:  Unable to complete at this time.  Diet Order:   Diet Order             Diet full liquid Room service appropriate? Yes; Fluid consistency: Thin  Diet effective now                   EDUCATION NEEDS:   No education needs have been identified at this time  Skin:  Skin Assessment: Reviewed RN Assessment  Last BM:  11/20  Height:   Ht Readings from Last 1 Encounters:  05/09/22 '5\' 6"'$  (1.676 m)    Weight:   Wt Readings from Last 1 Encounters:  05/12/22 62.3 kg    BMI:  Body mass index  is 22.17 kg/m.  Estimated Nutritional Needs:   Kcal:  1600-1800  Protein:  75-90g  Fluid:  1.8L/day  Clayton Bibles, MS, RD, LDN Inpatient Clinical Dietitian Contact information available via Amion

## 2022-05-12 NOTE — TOC Benefit Eligibility Note (Signed)
Patient Teacher, English as a foreign language completed.    The patient is currently admitted and upon discharge could be taking Dificid 200 mg.  The current 10 day co-pay is $1,545.37.   The patient is currently admitted and upon discharge could be taking Vancomycin 125 mg.  The current 10 day co-pay is $100.00  The patient is insured through [place], [place] Patient Advocate Specialist [place] Patient Advocate Team Direct Number: [phone]  Fax: [phone]

## 2022-05-13 DIAGNOSIS — I480 Paroxysmal atrial fibrillation: Secondary | ICD-10-CM | POA: Diagnosis not present

## 2022-05-13 DIAGNOSIS — A0472 Enterocolitis due to Clostridium difficile, not specified as recurrent: Secondary | ICD-10-CM | POA: Diagnosis not present

## 2022-05-13 DIAGNOSIS — E039 Hypothyroidism, unspecified: Secondary | ICD-10-CM | POA: Diagnosis not present

## 2022-05-13 DIAGNOSIS — R112 Nausea with vomiting, unspecified: Secondary | ICD-10-CM | POA: Diagnosis not present

## 2022-05-13 LAB — CBC WITH DIFFERENTIAL/PLATELET
Abs Immature Granulocytes: 0.02 10*3/uL (ref 0.00–0.07)
Basophils Absolute: 0 10*3/uL (ref 0.0–0.1)
Basophils Relative: 0 %
Eosinophils Absolute: 0.2 10*3/uL (ref 0.0–0.5)
Eosinophils Relative: 4 %
HCT: 39.4 % (ref 36.0–46.0)
Hemoglobin: 13.2 g/dL (ref 12.0–15.0)
Immature Granulocytes: 0 %
Lymphocytes Relative: 36 %
Lymphs Abs: 1.6 10*3/uL (ref 0.7–4.0)
MCH: 31.7 pg (ref 26.0–34.0)
MCHC: 33.5 g/dL (ref 30.0–36.0)
MCV: 94.5 fL (ref 80.0–100.0)
Monocytes Absolute: 0.4 10*3/uL (ref 0.1–1.0)
Monocytes Relative: 9 %
Neutro Abs: 2.2 10*3/uL (ref 1.7–7.7)
Neutrophils Relative %: 51 %
Platelets: 247 10*3/uL (ref 150–400)
RBC: 4.17 MIL/uL (ref 3.87–5.11)
RDW: 13 % (ref 11.5–15.5)
WBC: 4.5 10*3/uL (ref 4.0–10.5)
nRBC: 0 % (ref 0.0–0.2)

## 2022-05-13 LAB — COMPREHENSIVE METABOLIC PANEL
ALT: 18 U/L (ref 0–44)
AST: 24 U/L (ref 15–41)
Albumin: 3.3 g/dL — ABNORMAL LOW (ref 3.5–5.0)
Alkaline Phosphatase: 87 U/L (ref 38–126)
Anion gap: 4 — ABNORMAL LOW (ref 5–15)
BUN: 6 mg/dL — ABNORMAL LOW (ref 8–23)
CO2: 30 mmol/L (ref 22–32)
Calcium: 9.1 mg/dL (ref 8.9–10.3)
Chloride: 102 mmol/L (ref 98–111)
Creatinine, Ser: 0.73 mg/dL (ref 0.44–1.00)
GFR, Estimated: >60 mL/min (ref >60)
Glucose, Bld: 101 mg/dL — ABNORMAL HIGH (ref 70–99)
Potassium: 3.9 mmol/L (ref 3.5–5.1)
Sodium: 136 mmol/L (ref 135–145)
Total Bilirubin: 1.1 mg/dL (ref 0.3–1.2)
Total Protein: 6.8 g/dL (ref 6.5–8.1)

## 2022-05-13 LAB — MAGNESIUM: Magnesium: 2.2 mg/dL (ref 1.7–2.4)

## 2022-05-13 LAB — PHOSPHORUS: Phosphorus: 3.9 mg/dL (ref 2.5–4.6)

## 2022-05-13 NOTE — Progress Notes (Signed)
PROGRESS NOTE   Mallory Grant  OJJ:[id number] DOB: 06-21-1929 DOA: 05/09/2022 PCP: Shirline Frees, MD   Date of Service: the patient was seen and examined on 05/13/2022  Brief Narrative:  88 y.o. female with medical history significant for paroxysmal atrial fibrillation, not chronically anticoagulated, C. difficile colitis in February 2023, Alzheimer's dementia, acquired hypothyroidism who presented to [place] emergency department with a 5-day history of diarrhea and intractable nausea and vomiting on 11/17.    Of note, patient was recently treated for a urinary tract infection with a course of oral antibiotics several weeks ago.  Upon evaluation in the emergency department due to ongoing intractable symptoms of nausea and vomiting with clinical evidence of volume depletion and mild acute kidney injury the hospitalist group was called and patient was excepted for transfer to [facility] long hospital for continued medical care.    Patient was hospitalized and placed on intravenous volume resuscitation with isotonic fluids.  Patient was provided with as needed intravenous antiemetics.  Considering concurrent mild acute kidney injury, electrolytes and renal function were monitored closely.    CT imaging of the abdomen pelvis was consistent with enteritis with multiple fluid-filled loops. Initial C. difficile screening was positive for antigen but negative for toxin.  C. difficile PCR was positive as well.  Considering patient's Stansel symptoms of diarrhea, patient was empirically placed on oral vancomycin therapy regardless due to patient's known history of C. difficile infection and recent oral antibiotics which may have predisposed her to have a recurrence.  Patient exhibited gradual clinical improvement in the days that followed on oral vancomycin.   Assessment and Plan: Problem  Clostridioides difficile diarrhea and colitis  Continued clinical improvement with decreasing bowel  movements Continuing to treat with oral vancomycin slowly clinically improving  Intravenous fluids discontinued on 11/19 Advanced to diet on 11/21 Per my discussion with the son today, he is extremely weary of the patient being discharged today.  I have agreed to monitor the patient for 1 more night for close clinical monitoring as well as checking electrolytes and replacement as necessary.   Plan to likely discharge the patient in the morning.   Intractable Nausea and Vomiting  Resolved   Aki (Acute Kidney Injury) (Hcc)  Patient presenting with mild acute kidney injury secondary to volume depletion Now resolved with intravenous volume resuscitation. Continuing to monitor renal function and electrolytes with serial chemistries   Dehydration  Patient has been transitioned off of intravenous fluids on the 11/19   Generalized Weakness  Secondary to volume depletion and active infection Treating underlying infection PT recommending home health PT at time of discharge.   Paroxysmal Atrial Fibrillation (Hcc)  Continue home regimen of diltiazem Not on anticoagulation per home regimen Rate controlled, serial EKGs as needed   Acquired Hypothyroidism  Continue home regimen of Synthroid      Subjective:  Patient reports that she has not had any diarrhea yet today.  Patient denies any associated abdominal pain.  Patient states that her strength is improving.  Physical Exam:  Vitals:   05/12/22 1406 05/12/22 2227 05/13/22 0500 05/13/22 0618  BP: 115/61 119/69  129/65  Pulse: 66 (!) 56  60  Resp: '18 18  17  '$ Temp: 98.2 F (36.8 C) 98.9 F (37.2 C)  98.5 F (36.9 C)  TempSrc: Oral Oral  Oral  SpO2: 94% 95%  94%  Weight:   64.5 kg   Height:        Constitutional: Awake alert and  oriented x2, no associated distress.   Skin: no rashes, no lesions, poor skin turgor noted. Eyes: Pupils are equally reactive to light.  No evidence of scleral icterus or conjunctival pallor.   ENMT: Moist mucous membranes noted.  Posterior pharynx clear of any exudate or lesions.   Respiratory: clear to auscultation bilaterally, no wheezing, no crackles. Normal respiratory effort. No accessory muscle use.  Cardiovascular: Regular rate and rhythm, no murmurs / rubs / gallops. No extremity edema. 2+ pedal pulses. No carotid bruits.  Abdomen: Bowel sounds continue to be slightly hyperactive.  Abdomen is soft and nontender.   Musculoskeletal: No joint deformity upper and lower extremities. Good ROM, no contractures. Normal muscle tone.    Data Reviewed:  I have personally reviewed and interpreted labs, imaging.  Significant findings are   CBC: Recent Labs  Lab 05/09/22 1537 05/10/22 0610 05/11/22 0623 05/12/22 0727 05/13/22 0750  WBC 6.6 4.2 5.4 3.8* 4.5  NEUTROABS  --  2.4 3.3 1.5* PENDING  HGB 14.4 11.4* 11.8* 12.7 13.2  HCT 44.3 35.2* 36.2 38.4 39.4  MCV 96.1 97.5 96.0 94.1 94.5  PLT 237 189 198 225 974    Basic Metabolic Panel: Recent Labs  Lab 05/09/22 1537 05/10/22 0610 05/11/22 0623 05/12/22 0727 05/13/22 0750  NA 135 135 133* 139 136  K 3.6 3.9 3.6 3.9 3.9  CL 98 105 102 101 102  CO2 '27 25 25 30 30  '$ GLUCOSE 116* 84 89 92 101*  BUN '20 14 8 '$ <5* 6*  CREATININE 1.01* 0.73 0.73 0.71 0.73  CALCIUM 8.9 8.5* 8.4* 8.9 9.1  MG  --  2.0 1.9 2.1 2.2  PHOS  --   --   --   --  3.9    GFR: Estimated Creatinine Clearance: 43.8 mL/min (by C-G formula based on SCr of 0.73 mg/dL). Liver Function Tests: Recent Labs  Lab 05/09/22 1537 05/10/22 0610 05/11/22 0623 05/12/22 0727 05/13/22 0750  AST 39 31 32 28 24  ALT '19 17 19 17 18  '$ ALKPHOS 105 73 69 76 87  BILITOT 0.8 0.6 0.8 0.7 1.1  PROT 8.0 5.8* 6.0* 6.2* 6.8  ALBUMIN 3.8 2.9* 3.0* 3.0* 3.3*      Code Status:  DNR.  Code status decision has been confirmed with: son Family communication: Plan of care discussed at length with the son via phone conversation.   Severity of Illness:  The appropriate  patient status for this patient is INPATIENT. Inpatient status is judged to be reasonable and necessary in order to provide the required intensity of service to ensure the patient's safety. The patient's presenting symptoms, physical exam findings, and initial radiographic and laboratory data in the context of their chronic comorbidities is felt to place them at high risk for further clinical deterioration. Furthermore, it is not anticipated that the patient will be medically stable for discharge from the hospital within 2 midnights of admission.   * I certify that at the point of admission it is my clinical judgment that the patient will require inpatient hospital care spanning beyond 2 midnights from the point of admission due to high intensity of service, high risk for further deterioration and high frequency of surveillance required.*  Time spent:  47 minutes  Author:  Vernelle Emerald MD  05/13/2022 8:35 AM

## 2022-05-13 NOTE — TOC Progression Note (Signed)
Transition of Care [facility]) - Progression Note    Patient Details  Name: Mallory Grant MRN: [id number] Date of Birth: 30-Apr-1929  Transition of Care [facility] CM/SW [place], RN Phone Number:[phone]  05/13/2022, 11:01 AM  Clinical Narrative:    CM attempting to establish a disposition plan. CM called son Eddie Dibbles to determine which facility the patient comes from. Son does not answer. Message has been left. Will follow up with patient.         Expected Discharge Plan and Services                                                 Social Determinants of Health (SDOH) Interventions    Readmission Risk Interventions     No data to display

## 2022-05-13 NOTE — Progress Notes (Signed)
Physical Therapy Treatment Patient Details Name: Mallory Grant MRN: [id number] DOB: 01/26/1929 Today's Date: 05/13/2022   History of Present Illness Mallory Grant is a 88 y.o. female with medical history significant for paroxysmal atrial fibrillation, not chronically anticoagulated, C. difficile colitis in February 2023, Alzheimer's dementia, acquired hypothyroidism, who is admitted to [facility] on 05/09/2022 by way of transfer from [place] with intractable nausea/vomiting/diarrhea. Patient resides in ALF    PT Comments    The patient demonstrates improved mobility and ambulation using RW. Patient's son present and reports plans are for patient to return to ALF. Patient incontinent of urine during ambulation. Continue PT for  mobility.   Recommendations for follow up therapy are one component of a multi-disciplinary discharge planning process, led by the attending physician.  Recommendations may be updated based on patient status, additional functional criteria and insurance authorization.  Follow Up Recommendations  Home health PT     Assistance Recommended at Discharge Frequent or constant Supervision/Assistance  Patient can return home with the following A lot of help with walking and/or transfers;A lot of help with bathing/dressing/bathroom;Assistance with cooking/housework;Assist for transportation;Help with stairs or ramp for entrance   Equipment Recommendations  None recommended by PT    Recommendations for Other Services       Precautions / Restrictions Precautions Precautions: Fall Precaution Comments: waers depends/incontinent Restrictions Weight Bearing Restrictions: No     Mobility  Bed Mobility   Bed Mobility: Supine to Sit     Supine to sit: Min guard     General bed mobility comments: Patient able  to assist self to move to sitting.    Transfers Overall transfer level: Needs assistance Equipment used: Rolling walker (2  wheels) Transfers: Sit to/from Stand Sit to Stand: Min assist, +2 safety/equipment   Step pivot transfers: Min assist, +2 safety/equipment       General transfer comment: incontinent of urine,    Ambulation/Gait Ambulation/Gait assistance: Min assist, +2 safety/equipment Gait Distance (Feet): 20 Feet Assistive device: Rolling walker (2 wheels) Gait Pattern/deviations: Step-to pattern, Step-through pattern Gait velocity: decr     General Gait Details: frequent assistance with Rw for turns, staying inside Rw.   Stairs             Wheelchair Mobility    Modified Rankin (Stroke Patients Only)       Balance   Sitting-balance support: Bilateral upper extremity supported, Feet supported Sitting balance-Leahy Scale: Fair     Standing balance support: Bilateral upper extremity supported, During functional activity, Reliant on assistive device for balance Standing balance-Leahy Scale: Poor                              Cognition Arousal/Alertness: Awake/alert Behavior During Therapy: WFL for tasks assessed/performed Overall Cognitive Status: History of cognitive impairments - at baseline                                          Exercises      General Comments        Pertinent Vitals/Pain Pain Assessment Pain Assessment: No/denies pain    Home Living                          Prior Function  PT Goals (current goals can now be found in the care plan section) Progress towards PT goals: Progressing toward goals    Frequency    Min 2X/week      PT Plan Current plan remains appropriate    Co-evaluation              AM-PAC PT "6 Clicks" Mobility   Outcome Measure  Help needed turning from your back to your side while in a flat bed without using bedrails?: A Little Help needed moving from lying on your back to sitting on the side of a flat bed without using bedrails?: A Little Help needed  moving to and from a bed to a chair (including a wheelchair)?: A Little Help needed standing up from a chair using your arms (e.g., wheelchair or bedside chair)?: A Lot Help needed to walk in hospital room?: A Lot Help needed climbing 3-5 steps with a railing? : Total 6 Click Score: 14    End of Session Equipment Utilized During Treatment: Gait belt Activity Tolerance: Patient tolerated treatment well Patient left: in chair;with call bell/phone within reach;with chair alarm set;with family/visitor present Nurse Communication: Mobility status PT Visit Diagnosis: Unsteadiness on feet (R26.81);Difficulty in walking, not elsewhere classified (R26.2)     Time: 8099-8338 PT Time Calculation (min) (ACUTE ONLY): 17 min  Charges:  $Gait Training: 8-22 mins                     [place] Office [phone] Weekend [phone]    Claretha Cooper 05/13/2022, 1:19 PM

## 2022-05-14 LAB — CBC WITH DIFFERENTIAL/PLATELET
Abs Immature Granulocytes: 0.03 10*3/uL (ref 0.00–0.07)
Basophils Absolute: 0 10*3/uL (ref 0.0–0.1)
Basophils Relative: 0 %
Eosinophils Absolute: 0.3 10*3/uL (ref 0.0–0.5)
Eosinophils Relative: 6 %
HCT: 38.7 % (ref 36.0–46.0)
Hemoglobin: 12.8 g/dL (ref 12.0–15.0)
Immature Granulocytes: 1 %
Lymphocytes Relative: 42 %
Lymphs Abs: 2.1 10*3/uL (ref 0.7–4.0)
MCH: 31.3 pg (ref 26.0–34.0)
MCHC: 33.1 g/dL (ref 30.0–36.0)
MCV: 94.6 fL (ref 80.0–100.0)
Monocytes Absolute: 0.5 10*3/uL (ref 0.1–1.0)
Monocytes Relative: 9 %
Neutro Abs: 2.1 10*3/uL (ref 1.7–7.7)
Neutrophils Relative %: 42 %
Platelets: 255 10*3/uL (ref 150–400)
RBC: 4.09 MIL/uL (ref 3.87–5.11)
RDW: 13.1 % (ref 11.5–15.5)
WBC: 5 10*3/uL (ref 4.0–10.5)
nRBC: 0 % (ref 0.0–0.2)

## 2022-05-14 LAB — COMPREHENSIVE METABOLIC PANEL
ALT: 16 U/L (ref 0–44)
AST: 22 U/L (ref 15–41)
Albumin: 3.3 g/dL — ABNORMAL LOW (ref 3.5–5.0)
Alkaline Phosphatase: 90 U/L (ref 38–126)
Anion gap: 7 (ref 5–15)
BUN: 12 mg/dL (ref 8–23)
CO2: 29 mmol/L (ref 22–32)
Calcium: 9.3 mg/dL (ref 8.9–10.3)
Chloride: 104 mmol/L (ref 98–111)
Creatinine, Ser: 0.77 mg/dL (ref 0.44–1.00)
GFR, Estimated: >60 mL/min (ref >60)
Glucose, Bld: 92 mg/dL (ref 70–99)
Potassium: 3.7 mmol/L (ref 3.5–5.1)
Sodium: 140 mmol/L (ref 135–145)
Total Bilirubin: 0.9 mg/dL (ref 0.3–1.2)
Total Protein: 6.5 g/dL (ref 6.5–8.1)

## 2022-05-14 LAB — MAGNESIUM: Magnesium: 2.4 mg/dL (ref 1.7–2.4)

## 2022-05-14 MED ORDER — BANATROL TF EN LIQD: 60.0000 mL | Freq: Two times a day (BID) | ENTERAL | 0 refills | Status: AC

## 2022-05-14 MED ORDER — VANCOMYCIN HCL 125 MG PO CAPS: 125.0000 mg | ORAL_CAPSULE | Freq: Two times a day (BID) | ORAL | 0 refills | Status: DC

## 2022-05-14 MED ORDER — VANCOMYCIN HCL 125 MG PO CAPS: 125.0000 mg | ORAL_CAPSULE | Freq: Four times a day (QID) | ORAL | 0 refills | Status: DC

## 2022-05-14 MED ORDER — VANCOMYCIN HCL 125 MG PO CAPS: 125.0000 mg | ORAL_CAPSULE | Freq: Every day | ORAL | 0 refills | Status: DC

## 2022-05-14 MED ORDER — VANCOMYCIN HCL 125 MG PO CAPS: 125.0000 mg | ORAL_CAPSULE | Freq: Two times a day (BID) | ORAL | 0 refills | Status: AC

## 2022-05-14 MED ORDER — VANCOMYCIN HCL 125 MG PO CAPS: 125.0000 mg | ORAL_CAPSULE | ORAL | 0 refills | Status: DC

## 2022-05-14 MED ORDER — VANCOMYCIN HCL 125 MG PO CAPS: 125.0000 mg | ORAL_CAPSULE | ORAL | 0 refills | Status: AC

## 2022-05-14 MED ORDER — FLUOXETINE HCL 10 MG PO CAPS: 10.0000 mg | ORAL_CAPSULE | Freq: Every day | ORAL | 0 refills | Status: AC

## 2022-05-14 MED ORDER — VANCOMYCIN HCL 125 MG PO CAPS: 125.0000 mg | ORAL_CAPSULE | Freq: Four times a day (QID) | ORAL | 0 refills | Status: AC

## 2022-05-14 MED ORDER — MELATONIN 3 MG PO TABS: 3.0000 mg | ORAL_TABLET | Freq: Every evening | ORAL | 0 refills | Status: AC | PRN

## 2022-05-14 MED ORDER — VANCOMYCIN HCL 125 MG PO CAPS: 125.0000 mg | ORAL_CAPSULE | Freq: Every day | ORAL | 0 refills | Status: AC

## 2022-05-14 NOTE — TOC Progression Note (Addendum)
Transition of Care [facility]) - Progression Note    Patient Details  Name: Mallory Grant MRN: [id number] Date of Birth: [birth year]  Transition of Care [facility]) CM/SW [place], RN Phone Number:[phone]  05/14/2022, 10:58 AM  Clinical Narrative:    TOC following for patient from ALF with plan to return. CM spoke with son who states that patient is from [facility]  at the [place]. CM called [facility] to confirm that patient is from there and that patient is able to return when medically ready for d/c. Cm spoke with Shirlean Mylar who states that she will call CM back to confirm patient return and what will be needed in reference to patients return.   1118 CM has received return call from [place] at [facility]. Patient is ok to return. Will need discharge summary and any medication changes faxed to [phone] prior to discharge. No need to set up home health. Per Shirlean Mylar patient is already active with [facility] for home health at the facility. TOC will continue to follow.  1530 CM received call from [facility]. Patient will not be able to return on Thanksgiving Day but can return on Friday.         Expected Discharge Plan and Services                                                 Social Determinants of Health (SDOH) Interventions    Readmission Risk Interventions     No data to display

## 2022-05-14 NOTE — Discharge Summary (Addendum)
Physician Discharge Summary  Mallory Grant ZOX:[id number] DOB: [birth year] DOA: 05/09/2022  PCP: Shirline Frees, MD  Admit date: 05/09/2022 Discharge date: 05/14/2022  Time spent: 30 minutes  Recommendations for Outpatient Follow-up:   Clostridioides difficile diarrhea and colitis -Continued clinical improvement with decreasing bowel movements -Complete course of Oral vancomycin -Advanced to diet on 11/21  Intractable Nausea and Vomiting -resolved   AKI Lab Results  Component Value Date   CREATININE 0.77 05/14/2022   CREATININE 0.73 05/13/2022   CREATININE 0.71 05/12/2022   CREATININE 0.73 05/11/2022   CREATININE 0.73 05/10/2022  -Resolved  Dehydration - Resolved  Generalized weakness -Secondary to volume depletion and active infection -Treating underlying infection -PT recommending home health PT at time of discharge.  Patient resides at assisted living facility.  Paroxysmal A-fib - Currently NSR  Acquired Hypothyroidism - Continue home medication        Discharge Diagnoses:  Principal Problem:   Clostridioides difficile diarrhea Active Problems:   Intractable nausea and vomiting   AKI (acute kidney injury) ([place])   Dehydration   Generalized weakness   Paroxysmal atrial fibrillation ([place])   Acquired hypothyroidism   Discharge Condition: Stable  Diet recommendation: Soft diet  Filed Weights   05/12/22 0500 05/13/22 0500 05/14/22 0658  Weight: 62.3 kg 64.5 kg 62.4 kg    History of present illness:  88 y.o. female with medical history significant for paroxysmal atrial fibrillation, not chronically anticoagulated, C. difficile colitis in February 2023, Alzheimer's dementia, acquired hypothyroidism who presented to [facility] emergency department with a 5-day history of diarrhea and intractable nausea and vomiting on 11/17.     Of note, patient was recently treated for a urinary tract infection with a course of oral antibiotics several  weeks ago.   Upon evaluation in the emergency department due to ongoing intractable symptoms of nausea and vomiting with clinical evidence of volume depletion and mild acute kidney injury the hospitalist group was called and patient was excepted for transfer to [facility] long hospital for continued medical care.    Patient was hospitalized and placed on intravenous volume resuscitation with isotonic fluids.  Patient was provided with as needed intravenous antiemetics.  Considering concurrent mild acute kidney injury, electrolytes and renal function were monitored closely.     CT imaging of the abdomen pelvis was consistent with enteritis with multiple fluid-filled loops. Initial C. difficile screening was positive for antigen but negative for toxin.  C. difficile PCR was positive as well.  Considering patient's [place] symptoms of diarrhea, patient was empirically placed on oral vancomycin therapy regardless due to patient's known history of C. difficile infection and recent oral antibiotics which may have predisposed her to have a recurrence.   Patient exhibited gradual clinical improvement in the days that followed on oral vancomycin.  Hospital Course:  See above   Cultures  11/18 C. difficile antigen positive 11/18 positive toxigenic C. difficile  Antibiotics Anti-infectives (From admission, onward)    Start     Ordered Stop   06/16/22 1000  vancomycin (VANCOCIN) capsule 125 mg       See Hyperspace for full Linked Orders Report.   05/10/22 1432 06/25/22 0959   06/08/22 1000  vancomycin (VANCOCIN) capsule 125 mg       See Hyperspace for full Linked Orders Report.   05/10/22 1432 06/16/22 0959   06/01/22 1000  vancomycin (VANCOCIN) capsule 125 mg       See Hyperspace for full Linked Orders Report.   05/10/22 1432  06/08/22 0959   05/24/22 2200  vancomycin (VANCOCIN) capsule 125 mg       See Hyperspace for full Linked Orders Report.   05/10/22 1432 05/31/22 2159   05/10/22 1700   vancomycin (VANCOCIN) capsule 125 mg       See Hyperspace for full Linked Orders Report.   05/10/22 1432 05/24/22 1759         Discharge Exam: Vitals:   05/13/22 1424 05/13/22 2227 05/14/22 0658 05/14/22 1501  BP: (!) 106/58 117/70 113/64 122/68  Pulse: 66 61 67 64  Resp: '19 17 17 15  '$ Temp: 98.3 F (36.8 C) 98.4 F (36.9 C) 98.7 F (37.1 C) 98.2 F (36.8 C)  TempSrc: Oral Oral Oral Oral  SpO2: 93% 92% 93% 92%  Weight:   62.4 kg   Height:        Physical Exam:  General: A/O x4, No acute respiratory distress Eyes: negative scleral hemorrhage, negative anisocoria, negative icterus ENT: Negative Runny nose, negative gingival bleeding, Neck:  Negative scars, masses, torticollis, lymphadenopathy, JVD Lungs: Clear to auscultation bilaterally without wheezes or crackles Cardiovascular: Regular rate and rhythm without murmur gallop or rub normal S1 and S2 Abdomen: negative abdominal pain, nondistended, positive soft, bowel sounds, no rebound, no ascites, no appreciable mass      Discharge Instructions   Allergies as of 05/14/2022       Reactions   Diphenhydramine-zinc Acetate    Other reaction(s): Other (See Comments) Makes hyper   Procaine    Other reaction(s): Other (See Comments) Makes heart race    Benadryl [diphenhydramine Hcl] Other (See Comments)   hyperactivity   Codeine Nausea And Vomiting, Nausea Only   Novocain [procaine Hcl] Other (See Comments)   tachycardia   Sulfa Antibiotics Nausea And Vomiting, Nausea Only           Medication List     STOP taking these medications    potassium chloride SA 20 MEQ tablet Commonly known as: KLOR-CON M   traZODone 100 MG tablet Commonly known as: DESYREL       TAKE these medications    acetaminophen 325 MG tablet Commonly known as: TYLENOL Take by mouth every 6 (six) hours as needed for mild pain.   aspirin 81 MG chewable tablet Chew 1 tablet (81 mg total) by mouth daily.   diltiazem 120 MG 24  hr capsule Commonly known as: CARDIZEM CD Take 1 capsule (120 mg total) by mouth daily.   donepezil 10 MG tablet Commonly known as: ARICEPT Take 10 mg by mouth at bedtime.   fiber supplement (BANATROL TF) liquid Take 60 mLs by mouth 2 (two) times daily.   FLUoxetine 10 MG capsule Commonly known as: PROZAC Take 1 capsule (10 mg total) by mouth daily. Start taking on: May 15, 2022   ketorolac 0.5 % ophthalmic solution Commonly known as: ACULAR 1 drop in the morning, at noon, and at bedtime.   levothyroxine 50 MCG tablet Commonly known as: SYNTHROID Take 50 mcg by mouth daily.   melatonin 3 MG Tabs tablet Take 1 tablet (3 mg total) by mouth at bedtime as needed (insomnia).   memantine 5 MG tablet Commonly known as: NAMENDA Take 5 mg by mouth 2 (two) times daily.   ondansetron 4 MG tablet Commonly known as: ZOFRAN Take 1 tablet (4 mg total) by mouth every 6 (six) hours as needed for nausea.   PreserVision AREDS 2 Caps Take 2 tablets by mouth daily.   sertraline 100 MG tablet  Commonly known as: ZOLOFT Take 100 mg by mouth daily.   vancomycin 125 MG capsule Commonly known as: VANCOCIN Take 1 capsule (125 mg total) by mouth 4 (four) times daily.   vancomycin 125 MG capsule Commonly known as: VANCOCIN Take 1 capsule (125 mg total) by mouth 2 (two) times daily. Start taking on: May 24, 2022   vancomycin 125 MG capsule Commonly known as: VANCOCIN Take 1 capsule (125 mg total) by mouth daily. Start taking on: June 01, 2022   vancomycin 125 MG capsule Commonly known as: VANCOCIN Take 1 capsule (125 mg total) by mouth every other day. Start taking on: June 08, 2022   vancomycin 125 MG capsule Commonly known as: VANCOCIN Take 1 capsule (125 mg total) by mouth every 3 (three) days. Start taking on: June 16, 2022   VITAMIN D PO Take 2,000 Units by mouth daily.       Allergies  Allergen Reactions   Diphenhydramine-Zinc Acetate     Other  reaction(s): Other (See Comments) Makes hyper   Procaine     Other reaction(s): Other (See Comments) Makes heart race    Benadryl [Diphenhydramine Hcl] Other (See Comments)    hyperactivity   Codeine Nausea And Vomiting and Nausea Only   Novocain [Procaine Hcl] Other (See Comments)    tachycardia   Sulfa Antibiotics Nausea And Vomiting and Nausea Only           The results of significant diagnostics from this hospitalization (including imaging, microbiology, ancillary and laboratory) are listed below for reference.    Significant Diagnostic Studies: US Venous Img Lower Bilateral (DVT)  Result Date: 05/09/2022 CLINICAL DATA:  Leg pain.  Lower extremity swelling. EXAM: BILATERAL LOWER EXTREMITY VENOUS DOPPLER ULTRASOUND TECHNIQUE: Gray-scale sonography with compression, as well as color and duplex ultrasound, were performed to evaluate the deep venous system(s) from the level of the common femoral vein through the popliteal and proximal calf veins. COMPARISON:  None Available. FINDINGS: VENOUS Normal compressibility of the common femoral, superficial femoral, and popliteal veins, as well as the visualized calf veins. Visualized portions of profunda femoral vein and great saphenous vein unremarkable. No filling defects to suggest DVT on grayscale or color Doppler imaging. Doppler waveforms show normal direction of venous flow, normal respiratory plasticity and response to augmentation. OTHER None. Limitations: none IMPRESSION: No evidence of bilateral lower extremity DVT. Electronically Signed   By: Keith Rake M.D.   On: 05/09/2022 18:52   CT ABDOMEN PELVIS W CONTRAST  Result Date: 05/09/2022 CLINICAL DATA:  Abdomen pain diarrhea vomiting EXAM: CT ABDOMEN AND PELVIS WITH CONTRAST TECHNIQUE: Multidetector CT imaging of the abdomen and pelvis was performed using the standard protocol following bolus administration of intravenous contrast. RADIATION DOSE REDUCTION: This exam was performed  according to the departmental dose-optimization program which includes automated exposure control, adjustment of the mA and/or kV according to patient size and/or use of iterative reconstruction technique. CONTRAST:  38m OMNIPAQUE IOHEXOL 300 MG/ML  SOLN COMPARISON:  None Available. FINDINGS: Lower chest: Lung bases demonstrate no acute airspace disease. Fluid-filled moderate hiatal hernia. Hepatobiliary: Mildly distended gallbladder without calcified stone. No focal hepatic abnormality. Mild intra hepatic biliary dilatation. Common bile duct diameter up to 8 mm. Pancreas: No inflammatory changes. Mild dilatation of pancreatic duct at the head. Spleen: Normal in size without focal abnormality. Adrenals/Urinary Tract: Adrenal glands are unremarkable. Kidneys are normal, without renal calculi, focal lesion, or hydronephrosis. Bladder is unremarkable. Stomach/Bowel: Moderate fluid distension of stomach. Multiple fluid-filled loops of  small bowel. Fluid in the colon. Mild fecal impaction at the rectosigmoid colon. No acute bowel wall thickening. Mild mucosal enhancement of pelvic small bowel loops. Vascular/Lymphatic: Moderate aortic atherosclerosis. No aneurysm. No suspicious lymph nodes. Heterogenous opacification of the bilateral common femoral veins. Reproductive: Fluid in the upper uterine segment versus endometrial thickening. No adnexal mass Other: Small volume pelvic free fluid.  No free air. Musculoskeletal: No acute or significant osseous findings. IMPRESSION: 1. Multiple fluid-filled loops of small and large bowel with mild mucosal enhancement of pelvic small bowel loops, findings favor ileus or enteritis. No convincing evidence for bowel obstruction. 2. Small volume pelvic free fluid. 3. Fluid in the upper uterine segment versus endometrial thickening, suggest correlation with nonemergent pelvic ultrasound. 4. Heterogenous opacification of the bilateral common femoral veins, probably due to mixing artifact  but recommend correlation with bilateral lower extremity venous ultrasound to exclude DVT. 5. Mild intra and extrahepatic biliary dilatation with mild dilatation of pancreatic duct at the pancreatic head. Suggest correlation with LFTs and follow-up MRCP as indicated. 6. Aortic atherosclerosis. Aortic Atherosclerosis (ICD10-I70.0). Electronically Signed   By: Donavan Foil M.D.   On: 05/09/2022 17:35    Microbiology: Recent Results (from the past 240 hour(s))  MRSA Next Gen by PCR, Nasal     Status: None   Collection Time: 05/09/22 11:26 PM   Specimen: Nasal Mucosa; Nasal Swab  Result Value Ref Range Status   MRSA by PCR Next Gen NOT DETECTED NOT DETECTED Final    Comment: (NOTE) The GeneXpert MRSA Assay (FDA approved for NASAL specimens only), is one component of a comprehensive MRSA colonization surveillance program. It is not intended to diagnose MRSA infection nor to guide or monitor treatment for MRSA infections. Test performance is not FDA approved in patients less than 61 years old. Performed at [facility], [place] [address], [place], [place] [address]   C Difficile Quick Screen w PCR reflex     Status: Abnormal   Collection Time: 05/10/22  9:31 AM   Specimen: STOOL  Result Value Ref Range Status   C Diff antigen POSITIVE (A) NEGATIVE Final   C Diff toxin NEGATIVE NEGATIVE Final   C Diff interpretation Results are indeterminate. See PCR results.  Final    Comment: Performed at [facility], [place] [address], [place], [place] [address]  C. Diff by PCR, Reflexed     Status: Abnormal   Collection Time: 05/10/22  9:31 AM  Result Value Ref Range Status   Toxigenic C. Difficile by PCR POSITIVE (A) NEGATIVE Final    Comment: Positive for toxigenic C. difficile with little to no toxin production. Only treat if clinical presentation suggests symptomatic illness. Performed at [facility], [place] [address] [place], [place] [address]       Labs: Basic Metabolic Panel: Recent Labs  Lab 05/10/22 0610 05/11/22 0623 05/12/22 0727 05/13/22 0750 05/14/22 0551  NA 135 133* 139 136 140  K 3.9 3.6 3.9 3.9 3.7  CL 105 102 101 102 104  CO2 '25 25 30 30 29  '$ GLUCOSE 84 89 92 101* 92  BUN 14 8 <5* 6* 12  CREATININE 0.73 0.73 0.71 0.73 0.77  CALCIUM 8.5* 8.4* 8.9 9.1 9.3  MG 2.0 1.9 2.1 2.2 2.4  PHOS  --   --   --  3.9  --    Liver Function Tests: Recent Labs  Lab 05/10/22 0610 05/11/22 0623 05/12/22 0727 05/13/22 0750 05/14/22 0551  AST 31  32 '28 24 22  '$ ALT '17 19 17 18 16  '$ ALKPHOS 73 69 76 87 90  BILITOT 0.6 0.8 0.7 1.1 0.9  PROT 5.8* 6.0* 6.2* 6.8 6.5  ALBUMIN 2.9* 3.0* 3.0* 3.3* 3.3*   Recent Labs  Lab 05/09/22 1537 05/10/22 0610  LIPASE 202* 97*   No results for input(s): "AMMONIA" in the last 168 hours. CBC: Recent Labs  Lab 05/10/22 0610 05/11/22 0623 05/12/22 0727 05/13/22 0750 05/14/22 0551  WBC 4.2 5.4 3.8* 4.5 5.0  NEUTROABS 2.4 3.3 1.5* 2.2 2.1  HGB 11.4* 11.8* 12.7 13.2 12.8  HCT 35.2* 36.2 38.4 39.4 38.7  MCV 97.5 96.0 94.1 94.5 94.6  PLT 189 198 225 247 255   Cardiac Enzymes: No results for input(s): "CKTOTAL", "CKMB", "CKMBINDEX", "TROPONINI" in the last 168 hours. BNP: BNP (last 3 results) No results for input(s): "BNP" in the last 8760 hours.  ProBNP (last 3 results) No results for input(s): "PROBNP" in the last 8760 hours.  CBG: No results for input(s): "GLUCAP" in the last 168 hours.     Signed:  Dia Crawford, MD [facility]

## 2022-05-20 DIAGNOSIS — F0283 Dementia in other diseases classified elsewhere, unspecified severity, with mood disturbance: Secondary | ICD-10-CM | POA: Diagnosis not present

## 2022-05-20 DIAGNOSIS — G301 Alzheimer's disease with late onset: Secondary | ICD-10-CM | POA: Diagnosis not present

## 2022-05-20 DIAGNOSIS — I129 Hypertensive chronic kidney disease with stage 1 through stage 4 chronic kidney disease, or unspecified chronic kidney disease: Secondary | ICD-10-CM | POA: Diagnosis not present

## 2022-05-20 DIAGNOSIS — I4891 Unspecified atrial fibrillation: Secondary | ICD-10-CM | POA: Diagnosis not present

## 2022-05-20 DIAGNOSIS — F32A Depression, unspecified: Secondary | ICD-10-CM | POA: Diagnosis not present

## 2022-05-27 DIAGNOSIS — I4891 Unspecified atrial fibrillation: Secondary | ICD-10-CM | POA: Diagnosis not present

## 2022-05-27 DIAGNOSIS — I129 Hypertensive chronic kidney disease with stage 1 through stage 4 chronic kidney disease, or unspecified chronic kidney disease: Secondary | ICD-10-CM | POA: Diagnosis not present

## 2022-05-27 DIAGNOSIS — F0283 Dementia in other diseases classified elsewhere, unspecified severity, with mood disturbance: Secondary | ICD-10-CM | POA: Diagnosis not present

## 2022-05-27 DIAGNOSIS — F32A Depression, unspecified: Secondary | ICD-10-CM | POA: Diagnosis not present

## 2022-05-27 DIAGNOSIS — G301 Alzheimer's disease with late onset: Secondary | ICD-10-CM | POA: Diagnosis not present

## 2022-06-03 DIAGNOSIS — I4891 Unspecified atrial fibrillation: Secondary | ICD-10-CM | POA: Diagnosis not present

## 2022-06-03 DIAGNOSIS — G301 Alzheimer's disease with late onset: Secondary | ICD-10-CM | POA: Diagnosis not present

## 2022-06-03 DIAGNOSIS — F0283 Dementia in other diseases classified elsewhere, unspecified severity, with mood disturbance: Secondary | ICD-10-CM | POA: Diagnosis not present

## 2022-06-03 DIAGNOSIS — F32A Depression, unspecified: Secondary | ICD-10-CM | POA: Diagnosis not present

## 2022-06-03 DIAGNOSIS — I129 Hypertensive chronic kidney disease with stage 1 through stage 4 chronic kidney disease, or unspecified chronic kidney disease: Secondary | ICD-10-CM | POA: Diagnosis not present

## 2022-06-19 DIAGNOSIS — I129 Hypertensive chronic kidney disease with stage 1 through stage 4 chronic kidney disease, or unspecified chronic kidney disease: Secondary | ICD-10-CM | POA: Diagnosis not present

## 2022-06-19 DIAGNOSIS — G301 Alzheimer's disease with late onset: Secondary | ICD-10-CM | POA: Diagnosis not present

## 2022-06-19 DIAGNOSIS — I4891 Unspecified atrial fibrillation: Secondary | ICD-10-CM | POA: Diagnosis not present

## 2022-06-19 DIAGNOSIS — F32A Depression, unspecified: Secondary | ICD-10-CM | POA: Diagnosis not present

## 2022-06-19 DIAGNOSIS — F0283 Dementia in other diseases classified elsewhere, unspecified severity, with mood disturbance: Secondary | ICD-10-CM | POA: Diagnosis not present

## 2022-06-25 ENCOUNTER — Encounter (INDEPENDENT_AMBULATORY_CARE_PROVIDER_SITE_OTHER): Payer: Medicare Other | Admitting: Ophthalmology

## 2022-06-30 DIAGNOSIS — F0283 Dementia in other diseases classified elsewhere, unspecified severity, with mood disturbance: Secondary | ICD-10-CM | POA: Diagnosis not present

## 2022-06-30 DIAGNOSIS — I4891 Unspecified atrial fibrillation: Secondary | ICD-10-CM | POA: Diagnosis not present

## 2022-06-30 DIAGNOSIS — G301 Alzheimer's disease with late onset: Secondary | ICD-10-CM | POA: Diagnosis not present

## 2022-06-30 DIAGNOSIS — F32A Depression, unspecified: Secondary | ICD-10-CM | POA: Diagnosis not present

## 2022-06-30 DIAGNOSIS — I129 Hypertensive chronic kidney disease with stage 1 through stage 4 chronic kidney disease, or unspecified chronic kidney disease: Secondary | ICD-10-CM | POA: Diagnosis not present

## 2022-07-03 ENCOUNTER — Encounter (INDEPENDENT_AMBULATORY_CARE_PROVIDER_SITE_OTHER): Payer: Medicare Other | Admitting: Ophthalmology

## 2022-07-03 DIAGNOSIS — H43813 Vitreous degeneration, bilateral: Secondary | ICD-10-CM

## 2022-07-03 DIAGNOSIS — H353231 Exudative age-related macular degeneration, bilateral, with active choroidal neovascularization: Secondary | ICD-10-CM

## 2022-07-03 DIAGNOSIS — H33301 Unspecified retinal break, right eye: Secondary | ICD-10-CM | POA: Diagnosis not present

## 2022-07-03 DIAGNOSIS — H35033 Hypertensive retinopathy, bilateral: Secondary | ICD-10-CM | POA: Diagnosis not present

## 2022-07-03 DIAGNOSIS — I10 Essential (primary) hypertension: Secondary | ICD-10-CM | POA: Diagnosis not present

## 2022-07-04 DIAGNOSIS — N390 Urinary tract infection, site not specified: Secondary | ICD-10-CM | POA: Diagnosis not present

## 2022-07-09 DIAGNOSIS — G301 Alzheimer's disease with late onset: Secondary | ICD-10-CM | POA: Diagnosis not present

## 2022-07-09 DIAGNOSIS — I4891 Unspecified atrial fibrillation: Secondary | ICD-10-CM | POA: Diagnosis not present

## 2022-07-09 DIAGNOSIS — I129 Hypertensive chronic kidney disease with stage 1 through stage 4 chronic kidney disease, or unspecified chronic kidney disease: Secondary | ICD-10-CM | POA: Diagnosis not present

## 2022-07-09 DIAGNOSIS — F32A Depression, unspecified: Secondary | ICD-10-CM | POA: Diagnosis not present

## 2022-07-09 DIAGNOSIS — F0283 Dementia in other diseases classified elsewhere, unspecified severity, with mood disturbance: Secondary | ICD-10-CM | POA: Diagnosis not present

## 2022-07-16 DIAGNOSIS — I129 Hypertensive chronic kidney disease with stage 1 through stage 4 chronic kidney disease, or unspecified chronic kidney disease: Secondary | ICD-10-CM | POA: Diagnosis not present

## 2022-07-16 DIAGNOSIS — F0283 Dementia in other diseases classified elsewhere, unspecified severity, with mood disturbance: Secondary | ICD-10-CM | POA: Diagnosis not present

## 2022-07-16 DIAGNOSIS — G301 Alzheimer's disease with late onset: Secondary | ICD-10-CM | POA: Diagnosis not present

## 2022-07-16 DIAGNOSIS — I4891 Unspecified atrial fibrillation: Secondary | ICD-10-CM | POA: Diagnosis not present

## 2022-07-16 DIAGNOSIS — F32A Depression, unspecified: Secondary | ICD-10-CM | POA: Diagnosis not present

## 2022-07-24 DIAGNOSIS — I129 Hypertensive chronic kidney disease with stage 1 through stage 4 chronic kidney disease, or unspecified chronic kidney disease: Secondary | ICD-10-CM | POA: Diagnosis not present

## 2022-07-24 DIAGNOSIS — F0283 Dementia in other diseases classified elsewhere, unspecified severity, with mood disturbance: Secondary | ICD-10-CM | POA: Diagnosis not present

## 2022-07-24 DIAGNOSIS — I4891 Unspecified atrial fibrillation: Secondary | ICD-10-CM | POA: Diagnosis not present

## 2022-07-24 DIAGNOSIS — G301 Alzheimer's disease with late onset: Secondary | ICD-10-CM | POA: Diagnosis not present

## 2022-07-24 DIAGNOSIS — F32A Depression, unspecified: Secondary | ICD-10-CM | POA: Diagnosis not present

## 2022-07-28 DIAGNOSIS — G301 Alzheimer's disease with late onset: Secondary | ICD-10-CM | POA: Diagnosis not present

## 2022-07-28 DIAGNOSIS — F0283 Dementia in other diseases classified elsewhere, unspecified severity, with mood disturbance: Secondary | ICD-10-CM | POA: Diagnosis not present

## 2022-07-28 DIAGNOSIS — I4891 Unspecified atrial fibrillation: Secondary | ICD-10-CM | POA: Diagnosis not present

## 2022-07-28 DIAGNOSIS — F32A Depression, unspecified: Secondary | ICD-10-CM | POA: Diagnosis not present

## 2022-07-28 DIAGNOSIS — I129 Hypertensive chronic kidney disease with stage 1 through stage 4 chronic kidney disease, or unspecified chronic kidney disease: Secondary | ICD-10-CM | POA: Diagnosis not present

## 2022-07-29 DIAGNOSIS — F0283 Dementia in other diseases classified elsewhere, unspecified severity, with mood disturbance: Secondary | ICD-10-CM | POA: Diagnosis not present

## 2022-07-29 DIAGNOSIS — I129 Hypertensive chronic kidney disease with stage 1 through stage 4 chronic kidney disease, or unspecified chronic kidney disease: Secondary | ICD-10-CM | POA: Diagnosis not present

## 2022-07-29 DIAGNOSIS — G301 Alzheimer's disease with late onset: Secondary | ICD-10-CM | POA: Diagnosis not present

## 2022-07-29 DIAGNOSIS — F32A Depression, unspecified: Secondary | ICD-10-CM | POA: Diagnosis not present

## 2022-07-29 DIAGNOSIS — I4891 Unspecified atrial fibrillation: Secondary | ICD-10-CM | POA: Diagnosis not present

## 2022-08-05 DIAGNOSIS — G301 Alzheimer's disease with late onset: Secondary | ICD-10-CM | POA: Diagnosis not present

## 2022-08-05 DIAGNOSIS — F32A Depression, unspecified: Secondary | ICD-10-CM | POA: Diagnosis not present

## 2022-08-05 DIAGNOSIS — I4891 Unspecified atrial fibrillation: Secondary | ICD-10-CM | POA: Diagnosis not present

## 2022-08-05 DIAGNOSIS — F0283 Dementia in other diseases classified elsewhere, unspecified severity, with mood disturbance: Secondary | ICD-10-CM | POA: Diagnosis not present

## 2022-08-05 DIAGNOSIS — I129 Hypertensive chronic kidney disease with stage 1 through stage 4 chronic kidney disease, or unspecified chronic kidney disease: Secondary | ICD-10-CM | POA: Diagnosis not present

## 2022-08-06 DIAGNOSIS — G301 Alzheimer's disease with late onset: Secondary | ICD-10-CM | POA: Diagnosis not present

## 2022-08-06 DIAGNOSIS — F0283 Dementia in other diseases classified elsewhere, unspecified severity, with mood disturbance: Secondary | ICD-10-CM | POA: Diagnosis not present

## 2022-08-06 DIAGNOSIS — I4891 Unspecified atrial fibrillation: Secondary | ICD-10-CM | POA: Diagnosis not present

## 2022-08-06 DIAGNOSIS — I129 Hypertensive chronic kidney disease with stage 1 through stage 4 chronic kidney disease, or unspecified chronic kidney disease: Secondary | ICD-10-CM | POA: Diagnosis not present

## 2022-08-06 DIAGNOSIS — F32A Depression, unspecified: Secondary | ICD-10-CM | POA: Diagnosis not present

## 2022-08-08 DIAGNOSIS — I4891 Unspecified atrial fibrillation: Secondary | ICD-10-CM | POA: Diagnosis not present

## 2022-08-08 DIAGNOSIS — G301 Alzheimer's disease with late onset: Secondary | ICD-10-CM | POA: Diagnosis not present

## 2022-08-08 DIAGNOSIS — F32A Depression, unspecified: Secondary | ICD-10-CM | POA: Diagnosis not present

## 2022-08-08 DIAGNOSIS — F0283 Dementia in other diseases classified elsewhere, unspecified severity, with mood disturbance: Secondary | ICD-10-CM | POA: Diagnosis not present

## 2022-08-08 DIAGNOSIS — I129 Hypertensive chronic kidney disease with stage 1 through stage 4 chronic kidney disease, or unspecified chronic kidney disease: Secondary | ICD-10-CM | POA: Diagnosis not present

## 2022-08-11 DIAGNOSIS — F32A Depression, unspecified: Secondary | ICD-10-CM | POA: Diagnosis not present

## 2022-08-11 DIAGNOSIS — F0283 Dementia in other diseases classified elsewhere, unspecified severity, with mood disturbance: Secondary | ICD-10-CM | POA: Diagnosis not present

## 2022-08-11 DIAGNOSIS — I4891 Unspecified atrial fibrillation: Secondary | ICD-10-CM | POA: Diagnosis not present

## 2022-08-11 DIAGNOSIS — G301 Alzheimer's disease with late onset: Secondary | ICD-10-CM | POA: Diagnosis not present

## 2022-08-11 DIAGNOSIS — I129 Hypertensive chronic kidney disease with stage 1 through stage 4 chronic kidney disease, or unspecified chronic kidney disease: Secondary | ICD-10-CM | POA: Diagnosis not present

## 2022-08-12 DIAGNOSIS — I129 Hypertensive chronic kidney disease with stage 1 through stage 4 chronic kidney disease, or unspecified chronic kidney disease: Secondary | ICD-10-CM | POA: Diagnosis not present

## 2022-08-12 DIAGNOSIS — I4891 Unspecified atrial fibrillation: Secondary | ICD-10-CM | POA: Diagnosis not present

## 2022-08-12 DIAGNOSIS — F0283 Dementia in other diseases classified elsewhere, unspecified severity, with mood disturbance: Secondary | ICD-10-CM | POA: Diagnosis not present

## 2022-08-12 DIAGNOSIS — G301 Alzheimer's disease with late onset: Secondary | ICD-10-CM | POA: Diagnosis not present

## 2022-08-12 DIAGNOSIS — F32A Depression, unspecified: Secondary | ICD-10-CM | POA: Diagnosis not present

## 2022-08-15 DIAGNOSIS — F0283 Dementia in other diseases classified elsewhere, unspecified severity, with mood disturbance: Secondary | ICD-10-CM | POA: Diagnosis not present

## 2022-08-15 DIAGNOSIS — I4891 Unspecified atrial fibrillation: Secondary | ICD-10-CM | POA: Diagnosis not present

## 2022-08-15 DIAGNOSIS — G301 Alzheimer's disease with late onset: Secondary | ICD-10-CM | POA: Diagnosis not present

## 2022-08-15 DIAGNOSIS — I129 Hypertensive chronic kidney disease with stage 1 through stage 4 chronic kidney disease, or unspecified chronic kidney disease: Secondary | ICD-10-CM | POA: Diagnosis not present

## 2022-08-15 DIAGNOSIS — F32A Depression, unspecified: Secondary | ICD-10-CM | POA: Diagnosis not present

## 2022-08-19 DIAGNOSIS — G301 Alzheimer's disease with late onset: Secondary | ICD-10-CM | POA: Diagnosis not present

## 2022-08-19 DIAGNOSIS — I129 Hypertensive chronic kidney disease with stage 1 through stage 4 chronic kidney disease, or unspecified chronic kidney disease: Secondary | ICD-10-CM | POA: Diagnosis not present

## 2022-08-19 DIAGNOSIS — N1831 Chronic kidney disease, stage 3a: Secondary | ICD-10-CM | POA: Diagnosis not present

## 2022-08-19 DIAGNOSIS — E039 Hypothyroidism, unspecified: Secondary | ICD-10-CM | POA: Diagnosis not present

## 2022-08-19 DIAGNOSIS — F32A Depression, unspecified: Secondary | ICD-10-CM | POA: Diagnosis not present

## 2022-08-19 DIAGNOSIS — Z0000 Encounter for general adult medical examination without abnormal findings: Secondary | ICD-10-CM | POA: Diagnosis not present

## 2022-08-19 DIAGNOSIS — I4891 Unspecified atrial fibrillation: Secondary | ICD-10-CM | POA: Diagnosis not present

## 2022-08-19 DIAGNOSIS — F5101 Primary insomnia: Secondary | ICD-10-CM | POA: Diagnosis not present

## 2022-08-19 DIAGNOSIS — F0283 Dementia in other diseases classified elsewhere, unspecified severity, with mood disturbance: Secondary | ICD-10-CM | POA: Diagnosis not present

## 2022-08-19 DIAGNOSIS — I480 Paroxysmal atrial fibrillation: Secondary | ICD-10-CM | POA: Diagnosis not present

## 2022-08-21 DIAGNOSIS — I129 Hypertensive chronic kidney disease with stage 1 through stage 4 chronic kidney disease, or unspecified chronic kidney disease: Secondary | ICD-10-CM | POA: Diagnosis not present

## 2022-08-21 DIAGNOSIS — F0283 Dementia in other diseases classified elsewhere, unspecified severity, with mood disturbance: Secondary | ICD-10-CM | POA: Diagnosis not present

## 2022-08-21 DIAGNOSIS — I4891 Unspecified atrial fibrillation: Secondary | ICD-10-CM | POA: Diagnosis not present

## 2022-08-21 DIAGNOSIS — G301 Alzheimer's disease with late onset: Secondary | ICD-10-CM | POA: Diagnosis not present

## 2022-08-21 DIAGNOSIS — F32A Depression, unspecified: Secondary | ICD-10-CM | POA: Diagnosis not present

## 2022-08-23 DIAGNOSIS — N390 Urinary tract infection, site not specified: Secondary | ICD-10-CM | POA: Diagnosis not present

## 2022-08-24 DIAGNOSIS — E039 Hypothyroidism, unspecified: Secondary | ICD-10-CM | POA: Diagnosis not present

## 2022-08-24 DIAGNOSIS — N189 Chronic kidney disease, unspecified: Secondary | ICD-10-CM | POA: Diagnosis not present

## 2022-08-24 DIAGNOSIS — F02818 Dementia in other diseases classified elsewhere, unspecified severity, with other behavioral disturbance: Secondary | ICD-10-CM | POA: Diagnosis not present

## 2022-08-24 DIAGNOSIS — F32A Depression, unspecified: Secondary | ICD-10-CM | POA: Diagnosis not present

## 2022-08-24 DIAGNOSIS — M810 Age-related osteoporosis without current pathological fracture: Secondary | ICD-10-CM | POA: Diagnosis not present

## 2022-08-24 DIAGNOSIS — Z7982 Long term (current) use of aspirin: Secondary | ICD-10-CM | POA: Diagnosis not present

## 2022-08-24 DIAGNOSIS — G301 Alzheimer's disease with late onset: Secondary | ICD-10-CM | POA: Diagnosis not present

## 2022-08-24 DIAGNOSIS — I129 Hypertensive chronic kidney disease with stage 1 through stage 4 chronic kidney disease, or unspecified chronic kidney disease: Secondary | ICD-10-CM | POA: Diagnosis not present

## 2022-08-24 DIAGNOSIS — F0283 Dementia in other diseases classified elsewhere, unspecified severity, with mood disturbance: Secondary | ICD-10-CM | POA: Diagnosis not present

## 2022-08-24 DIAGNOSIS — G4700 Insomnia, unspecified: Secondary | ICD-10-CM | POA: Diagnosis not present

## 2022-08-24 DIAGNOSIS — I4891 Unspecified atrial fibrillation: Secondary | ICD-10-CM | POA: Diagnosis not present

## 2022-08-25 DIAGNOSIS — G301 Alzheimer's disease with late onset: Secondary | ICD-10-CM | POA: Diagnosis not present

## 2022-08-25 DIAGNOSIS — F32A Depression, unspecified: Secondary | ICD-10-CM | POA: Diagnosis not present

## 2022-08-25 DIAGNOSIS — F02818 Dementia in other diseases classified elsewhere, unspecified severity, with other behavioral disturbance: Secondary | ICD-10-CM | POA: Diagnosis not present

## 2022-08-25 DIAGNOSIS — F0283 Dementia in other diseases classified elsewhere, unspecified severity, with mood disturbance: Secondary | ICD-10-CM | POA: Diagnosis not present

## 2022-08-28 DIAGNOSIS — G301 Alzheimer's disease with late onset: Secondary | ICD-10-CM | POA: Diagnosis not present

## 2022-08-28 DIAGNOSIS — F32A Depression, unspecified: Secondary | ICD-10-CM | POA: Diagnosis not present

## 2022-08-28 DIAGNOSIS — F0283 Dementia in other diseases classified elsewhere, unspecified severity, with mood disturbance: Secondary | ICD-10-CM | POA: Diagnosis not present

## 2022-08-28 DIAGNOSIS — F02818 Dementia in other diseases classified elsewhere, unspecified severity, with other behavioral disturbance: Secondary | ICD-10-CM | POA: Diagnosis not present

## 2022-08-29 DIAGNOSIS — L308 Other specified dermatitis: Secondary | ICD-10-CM | POA: Diagnosis not present

## 2022-09-01 DIAGNOSIS — F0283 Dementia in other diseases classified elsewhere, unspecified severity, with mood disturbance: Secondary | ICD-10-CM | POA: Diagnosis not present

## 2022-09-01 DIAGNOSIS — F32A Depression, unspecified: Secondary | ICD-10-CM | POA: Diagnosis not present

## 2022-09-01 DIAGNOSIS — F02818 Dementia in other diseases classified elsewhere, unspecified severity, with other behavioral disturbance: Secondary | ICD-10-CM | POA: Diagnosis not present

## 2022-09-01 DIAGNOSIS — G301 Alzheimer's disease with late onset: Secondary | ICD-10-CM | POA: Diagnosis not present

## 2022-09-04 DIAGNOSIS — G301 Alzheimer's disease with late onset: Secondary | ICD-10-CM | POA: Diagnosis not present

## 2022-09-04 DIAGNOSIS — F32A Depression, unspecified: Secondary | ICD-10-CM | POA: Diagnosis not present

## 2022-09-04 DIAGNOSIS — F02818 Dementia in other diseases classified elsewhere, unspecified severity, with other behavioral disturbance: Secondary | ICD-10-CM | POA: Diagnosis not present

## 2022-09-04 DIAGNOSIS — F0283 Dementia in other diseases classified elsewhere, unspecified severity, with mood disturbance: Secondary | ICD-10-CM | POA: Diagnosis not present

## 2022-09-08 DIAGNOSIS — F32A Depression, unspecified: Secondary | ICD-10-CM | POA: Diagnosis not present

## 2022-09-08 DIAGNOSIS — F0283 Dementia in other diseases classified elsewhere, unspecified severity, with mood disturbance: Secondary | ICD-10-CM | POA: Diagnosis not present

## 2022-09-08 DIAGNOSIS — G301 Alzheimer's disease with late onset: Secondary | ICD-10-CM | POA: Diagnosis not present

## 2022-09-08 DIAGNOSIS — F02818 Dementia in other diseases classified elsewhere, unspecified severity, with other behavioral disturbance: Secondary | ICD-10-CM | POA: Diagnosis not present

## 2022-09-10 DIAGNOSIS — F32A Depression, unspecified: Secondary | ICD-10-CM | POA: Diagnosis not present

## 2022-09-10 DIAGNOSIS — G301 Alzheimer's disease with late onset: Secondary | ICD-10-CM | POA: Diagnosis not present

## 2022-09-10 DIAGNOSIS — F02818 Dementia in other diseases classified elsewhere, unspecified severity, with other behavioral disturbance: Secondary | ICD-10-CM | POA: Diagnosis not present

## 2022-09-10 DIAGNOSIS — F0283 Dementia in other diseases classified elsewhere, unspecified severity, with mood disturbance: Secondary | ICD-10-CM | POA: Diagnosis not present

## 2022-09-16 DIAGNOSIS — F02818 Dementia in other diseases classified elsewhere, unspecified severity, with other behavioral disturbance: Secondary | ICD-10-CM | POA: Diagnosis not present

## 2022-09-16 DIAGNOSIS — G301 Alzheimer's disease with late onset: Secondary | ICD-10-CM | POA: Diagnosis not present

## 2022-09-16 DIAGNOSIS — F0283 Dementia in other diseases classified elsewhere, unspecified severity, with mood disturbance: Secondary | ICD-10-CM | POA: Diagnosis not present

## 2022-09-16 DIAGNOSIS — F32A Depression, unspecified: Secondary | ICD-10-CM | POA: Diagnosis not present

## 2022-09-17 DIAGNOSIS — F02B4 Dementia in other diseases classified elsewhere, moderate, with anxiety: Secondary | ICD-10-CM | POA: Diagnosis not present

## 2022-09-17 DIAGNOSIS — Z7982 Long term (current) use of aspirin: Secondary | ICD-10-CM | POA: Diagnosis not present

## 2022-09-17 DIAGNOSIS — G301 Alzheimer's disease with late onset: Secondary | ICD-10-CM | POA: Diagnosis not present

## 2022-09-17 DIAGNOSIS — G479 Sleep disorder, unspecified: Secondary | ICD-10-CM | POA: Diagnosis not present

## 2022-09-17 DIAGNOSIS — F02B3 Dementia in other diseases classified elsewhere, moderate, with mood disturbance: Secondary | ICD-10-CM | POA: Diagnosis not present

## 2022-09-17 DIAGNOSIS — I480 Paroxysmal atrial fibrillation: Secondary | ICD-10-CM | POA: Diagnosis not present

## 2022-09-17 DIAGNOSIS — I129 Hypertensive chronic kidney disease with stage 1 through stage 4 chronic kidney disease, or unspecified chronic kidney disease: Secondary | ICD-10-CM | POA: Diagnosis not present

## 2022-09-17 DIAGNOSIS — N1831 Chronic kidney disease, stage 3a: Secondary | ICD-10-CM | POA: Diagnosis not present

## 2022-09-17 DIAGNOSIS — E039 Hypothyroidism, unspecified: Secondary | ICD-10-CM | POA: Diagnosis not present

## 2022-09-17 DIAGNOSIS — H3530 Unspecified macular degeneration: Secondary | ICD-10-CM | POA: Diagnosis not present

## 2022-09-18 ENCOUNTER — Encounter (INDEPENDENT_AMBULATORY_CARE_PROVIDER_SITE_OTHER): Payer: Medicare Other | Admitting: Ophthalmology

## 2022-09-18 DIAGNOSIS — H43813 Vitreous degeneration, bilateral: Secondary | ICD-10-CM | POA: Diagnosis not present

## 2022-09-18 DIAGNOSIS — H353231 Exudative age-related macular degeneration, bilateral, with active choroidal neovascularization: Secondary | ICD-10-CM | POA: Diagnosis not present

## 2022-09-18 DIAGNOSIS — H33301 Unspecified retinal break, right eye: Secondary | ICD-10-CM

## 2022-09-23 DIAGNOSIS — N1831 Chronic kidney disease, stage 3a: Secondary | ICD-10-CM | POA: Diagnosis not present

## 2022-09-23 DIAGNOSIS — M79662 Pain in left lower leg: Secondary | ICD-10-CM | POA: Diagnosis not present

## 2022-09-23 DIAGNOSIS — E876 Hypokalemia: Secondary | ICD-10-CM | POA: Diagnosis not present

## 2022-10-06 DIAGNOSIS — H9193 Unspecified hearing loss, bilateral: Secondary | ICD-10-CM | POA: Diagnosis not present

## 2022-10-06 DIAGNOSIS — H6123 Impacted cerumen, bilateral: Secondary | ICD-10-CM | POA: Diagnosis not present

## 2022-12-29 DIAGNOSIS — I480 Paroxysmal atrial fibrillation: Secondary | ICD-10-CM | POA: Diagnosis not present

## 2022-12-29 DIAGNOSIS — M25512 Pain in left shoulder: Secondary | ICD-10-CM | POA: Diagnosis not present

## 2022-12-29 DIAGNOSIS — G309 Alzheimer's disease, unspecified: Secondary | ICD-10-CM | POA: Diagnosis not present

## 2022-12-29 DIAGNOSIS — Z7982 Long term (current) use of aspirin: Secondary | ICD-10-CM | POA: Diagnosis not present

## 2022-12-29 DIAGNOSIS — G8929 Other chronic pain: Secondary | ICD-10-CM | POA: Diagnosis not present

## 2022-12-30 DIAGNOSIS — M19012 Primary osteoarthritis, left shoulder: Secondary | ICD-10-CM | POA: Diagnosis not present

## 2023-01-01 DIAGNOSIS — I129 Hypertensive chronic kidney disease with stage 1 through stage 4 chronic kidney disease, or unspecified chronic kidney disease: Secondary | ICD-10-CM | POA: Diagnosis not present

## 2023-01-01 DIAGNOSIS — G309 Alzheimer's disease, unspecified: Secondary | ICD-10-CM | POA: Diagnosis not present

## 2023-01-01 DIAGNOSIS — F02B4 Dementia in other diseases classified elsewhere, moderate, with anxiety: Secondary | ICD-10-CM | POA: Diagnosis not present

## 2023-01-01 DIAGNOSIS — F02B3 Dementia in other diseases classified elsewhere, moderate, with mood disturbance: Secondary | ICD-10-CM | POA: Diagnosis not present

## 2023-01-05 DIAGNOSIS — G479 Sleep disorder, unspecified: Secondary | ICD-10-CM | POA: Diagnosis not present

## 2023-01-05 DIAGNOSIS — M19012 Primary osteoarthritis, left shoulder: Secondary | ICD-10-CM | POA: Diagnosis not present

## 2023-01-05 DIAGNOSIS — M810 Age-related osteoporosis without current pathological fracture: Secondary | ICD-10-CM | POA: Diagnosis not present

## 2023-01-08 ENCOUNTER — Encounter (INDEPENDENT_AMBULATORY_CARE_PROVIDER_SITE_OTHER): Payer: Medicare Other | Admitting: Ophthalmology

## 2023-01-08 DIAGNOSIS — H43813 Vitreous degeneration, bilateral: Secondary | ICD-10-CM

## 2023-01-08 DIAGNOSIS — F02B3 Dementia in other diseases classified elsewhere, moderate, with mood disturbance: Secondary | ICD-10-CM | POA: Diagnosis not present

## 2023-01-08 DIAGNOSIS — G309 Alzheimer's disease, unspecified: Secondary | ICD-10-CM | POA: Diagnosis not present

## 2023-01-08 DIAGNOSIS — H33301 Unspecified retinal break, right eye: Secondary | ICD-10-CM

## 2023-01-08 DIAGNOSIS — H353231 Exudative age-related macular degeneration, bilateral, with active choroidal neovascularization: Secondary | ICD-10-CM

## 2023-01-08 DIAGNOSIS — F02B4 Dementia in other diseases classified elsewhere, moderate, with anxiety: Secondary | ICD-10-CM | POA: Diagnosis not present

## 2023-01-08 DIAGNOSIS — I129 Hypertensive chronic kidney disease with stage 1 through stage 4 chronic kidney disease, or unspecified chronic kidney disease: Secondary | ICD-10-CM | POA: Diagnosis not present

## 2023-01-09 DIAGNOSIS — G309 Alzheimer's disease, unspecified: Secondary | ICD-10-CM | POA: Diagnosis not present

## 2023-01-09 DIAGNOSIS — F02B4 Dementia in other diseases classified elsewhere, moderate, with anxiety: Secondary | ICD-10-CM | POA: Diagnosis not present

## 2023-01-09 DIAGNOSIS — F02B3 Dementia in other diseases classified elsewhere, moderate, with mood disturbance: Secondary | ICD-10-CM | POA: Diagnosis not present

## 2023-01-09 DIAGNOSIS — I129 Hypertensive chronic kidney disease with stage 1 through stage 4 chronic kidney disease, or unspecified chronic kidney disease: Secondary | ICD-10-CM | POA: Diagnosis not present

## 2023-01-12 DIAGNOSIS — F02B4 Dementia in other diseases classified elsewhere, moderate, with anxiety: Secondary | ICD-10-CM | POA: Diagnosis not present

## 2023-01-12 DIAGNOSIS — G309 Alzheimer's disease, unspecified: Secondary | ICD-10-CM | POA: Diagnosis not present

## 2023-01-12 DIAGNOSIS — I129 Hypertensive chronic kidney disease with stage 1 through stage 4 chronic kidney disease, or unspecified chronic kidney disease: Secondary | ICD-10-CM | POA: Diagnosis not present

## 2023-01-12 DIAGNOSIS — F02B3 Dementia in other diseases classified elsewhere, moderate, with mood disturbance: Secondary | ICD-10-CM | POA: Diagnosis not present

## 2023-01-15 DIAGNOSIS — F02B4 Dementia in other diseases classified elsewhere, moderate, with anxiety: Secondary | ICD-10-CM | POA: Diagnosis not present

## 2023-01-15 DIAGNOSIS — I129 Hypertensive chronic kidney disease with stage 1 through stage 4 chronic kidney disease, or unspecified chronic kidney disease: Secondary | ICD-10-CM | POA: Diagnosis not present

## 2023-01-15 DIAGNOSIS — F02B3 Dementia in other diseases classified elsewhere, moderate, with mood disturbance: Secondary | ICD-10-CM | POA: Diagnosis not present

## 2023-01-15 DIAGNOSIS — G309 Alzheimer's disease, unspecified: Secondary | ICD-10-CM | POA: Diagnosis not present

## 2023-01-21 DIAGNOSIS — F02B0 Dementia in other diseases classified elsewhere, moderate, without behavioral disturbance, psychotic disturbance, mood disturbance, and anxiety: Secondary | ICD-10-CM | POA: Diagnosis not present

## 2023-01-21 DIAGNOSIS — G301 Alzheimer's disease with late onset: Secondary | ICD-10-CM | POA: Diagnosis not present

## 2023-01-23 DIAGNOSIS — G309 Alzheimer's disease, unspecified: Secondary | ICD-10-CM | POA: Diagnosis not present

## 2023-01-23 DIAGNOSIS — I129 Hypertensive chronic kidney disease with stage 1 through stage 4 chronic kidney disease, or unspecified chronic kidney disease: Secondary | ICD-10-CM | POA: Diagnosis not present

## 2023-01-28 DIAGNOSIS — I129 Hypertensive chronic kidney disease with stage 1 through stage 4 chronic kidney disease, or unspecified chronic kidney disease: Secondary | ICD-10-CM | POA: Diagnosis not present

## 2023-01-28 DIAGNOSIS — G309 Alzheimer's disease, unspecified: Secondary | ICD-10-CM | POA: Diagnosis not present

## 2023-02-05 DIAGNOSIS — G309 Alzheimer's disease, unspecified: Secondary | ICD-10-CM | POA: Diagnosis not present

## 2023-02-05 DIAGNOSIS — I129 Hypertensive chronic kidney disease with stage 1 through stage 4 chronic kidney disease, or unspecified chronic kidney disease: Secondary | ICD-10-CM | POA: Diagnosis not present

## 2023-02-11 DIAGNOSIS — G309 Alzheimer's disease, unspecified: Secondary | ICD-10-CM | POA: Diagnosis not present

## 2023-02-11 DIAGNOSIS — I129 Hypertensive chronic kidney disease with stage 1 through stage 4 chronic kidney disease, or unspecified chronic kidney disease: Secondary | ICD-10-CM | POA: Diagnosis not present

## 2023-02-13 DIAGNOSIS — I129 Hypertensive chronic kidney disease with stage 1 through stage 4 chronic kidney disease, or unspecified chronic kidney disease: Secondary | ICD-10-CM | POA: Diagnosis not present

## 2023-02-13 DIAGNOSIS — G309 Alzheimer's disease, unspecified: Secondary | ICD-10-CM | POA: Diagnosis not present

## 2023-02-16 DIAGNOSIS — G309 Alzheimer's disease, unspecified: Secondary | ICD-10-CM | POA: Diagnosis not present

## 2023-02-16 DIAGNOSIS — I129 Hypertensive chronic kidney disease with stage 1 through stage 4 chronic kidney disease, or unspecified chronic kidney disease: Secondary | ICD-10-CM | POA: Diagnosis not present

## 2023-02-18 DIAGNOSIS — G309 Alzheimer's disease, unspecified: Secondary | ICD-10-CM | POA: Diagnosis not present

## 2023-02-18 DIAGNOSIS — I129 Hypertensive chronic kidney disease with stage 1 through stage 4 chronic kidney disease, or unspecified chronic kidney disease: Secondary | ICD-10-CM | POA: Diagnosis not present

## 2023-02-20 DIAGNOSIS — I129 Hypertensive chronic kidney disease with stage 1 through stage 4 chronic kidney disease, or unspecified chronic kidney disease: Secondary | ICD-10-CM | POA: Diagnosis not present

## 2023-02-20 DIAGNOSIS — G309 Alzheimer's disease, unspecified: Secondary | ICD-10-CM | POA: Diagnosis not present

## 2023-02-24 DIAGNOSIS — G309 Alzheimer's disease, unspecified: Secondary | ICD-10-CM | POA: Diagnosis not present

## 2023-02-24 DIAGNOSIS — I129 Hypertensive chronic kidney disease with stage 1 through stage 4 chronic kidney disease, or unspecified chronic kidney disease: Secondary | ICD-10-CM | POA: Diagnosis not present

## 2023-02-24 IMAGING — CR DG CHEST 2V
2 series · 2 of 2 positions shown · non-contrast
Comparison: 07/30/2021

CLINICAL DATA: Fell onto shoulder yesterday

EXAM:
CHEST - 2 VIEW

[w chest ap]
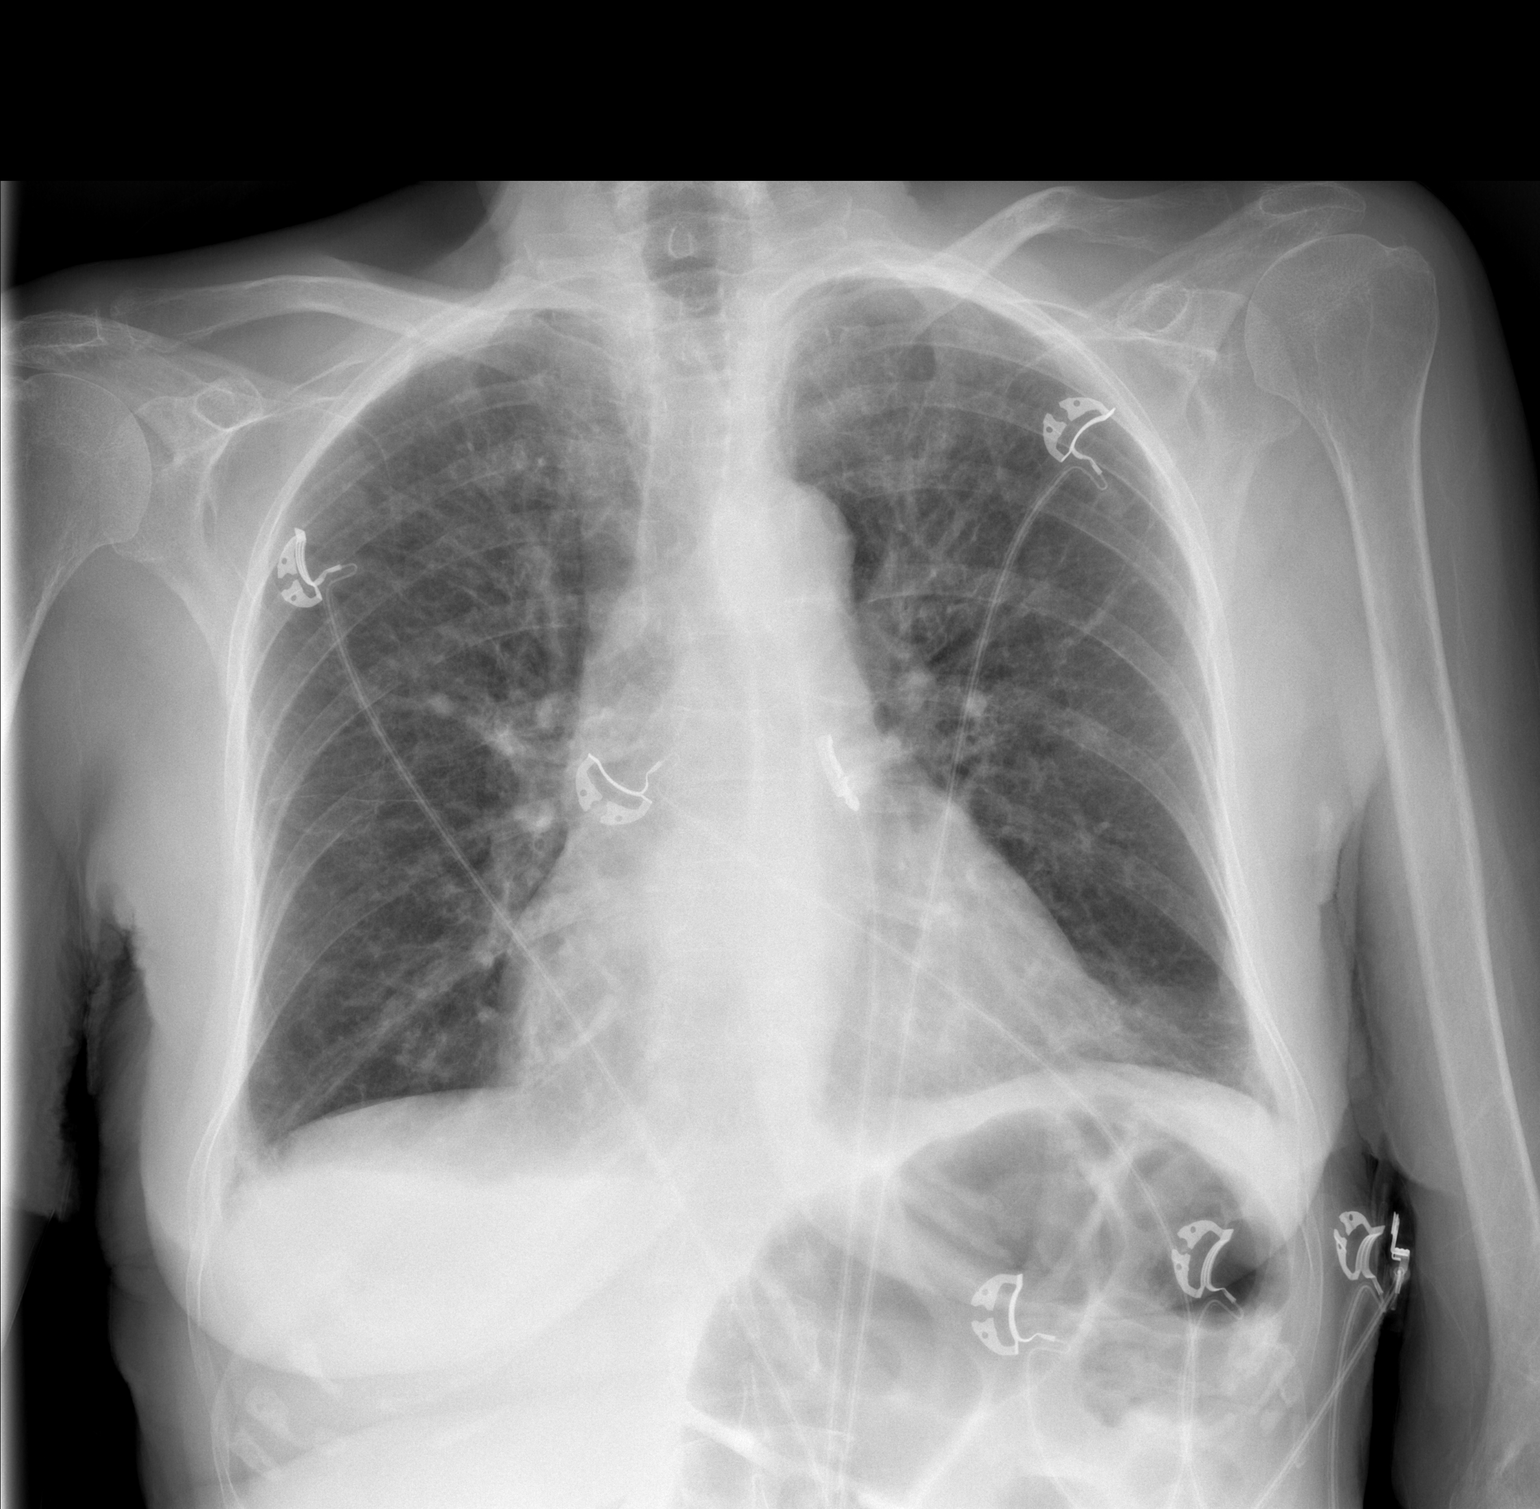

[w chest lat]
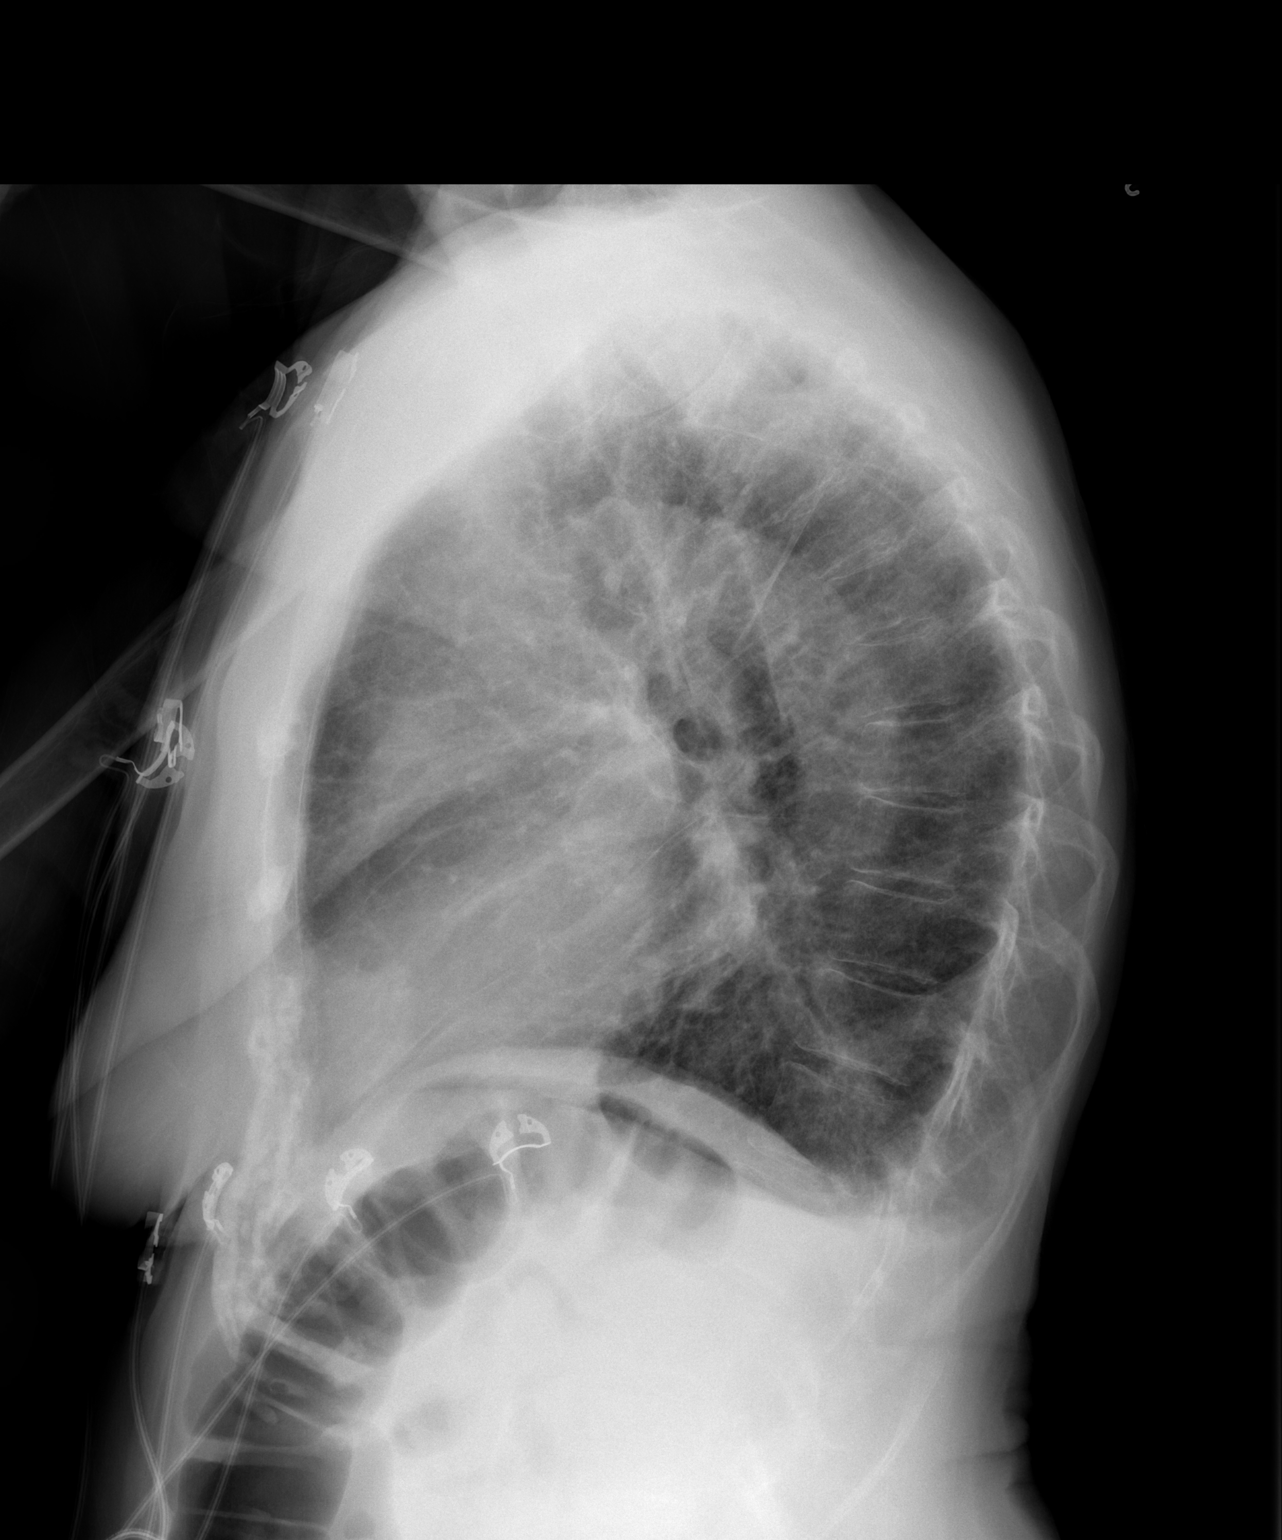

[2 of 2 positions shown; findings below may reference images not displayed]

FINDINGS: Artifact overlies the chest. Mediastinal shadows are normal. There
are small effusions in the posterior costophrenic angles. The lungs
are otherwise clear. No acute bone finding in the region.
IMPRESSION: Small effusions in the posterior costophrenic angles. Otherwise
negative.

## 2023-02-24 IMAGING — CR DG SHOULDER 2+V*L*
3 series · 3 of 3 positions shown · non-contrast
Comparison: None.

CLINICAL DATA: Fell yesterday with shoulder pain.

EXAM:
LEFT SHOULDER - 2+ VIEW

[w shoulder grashey left]
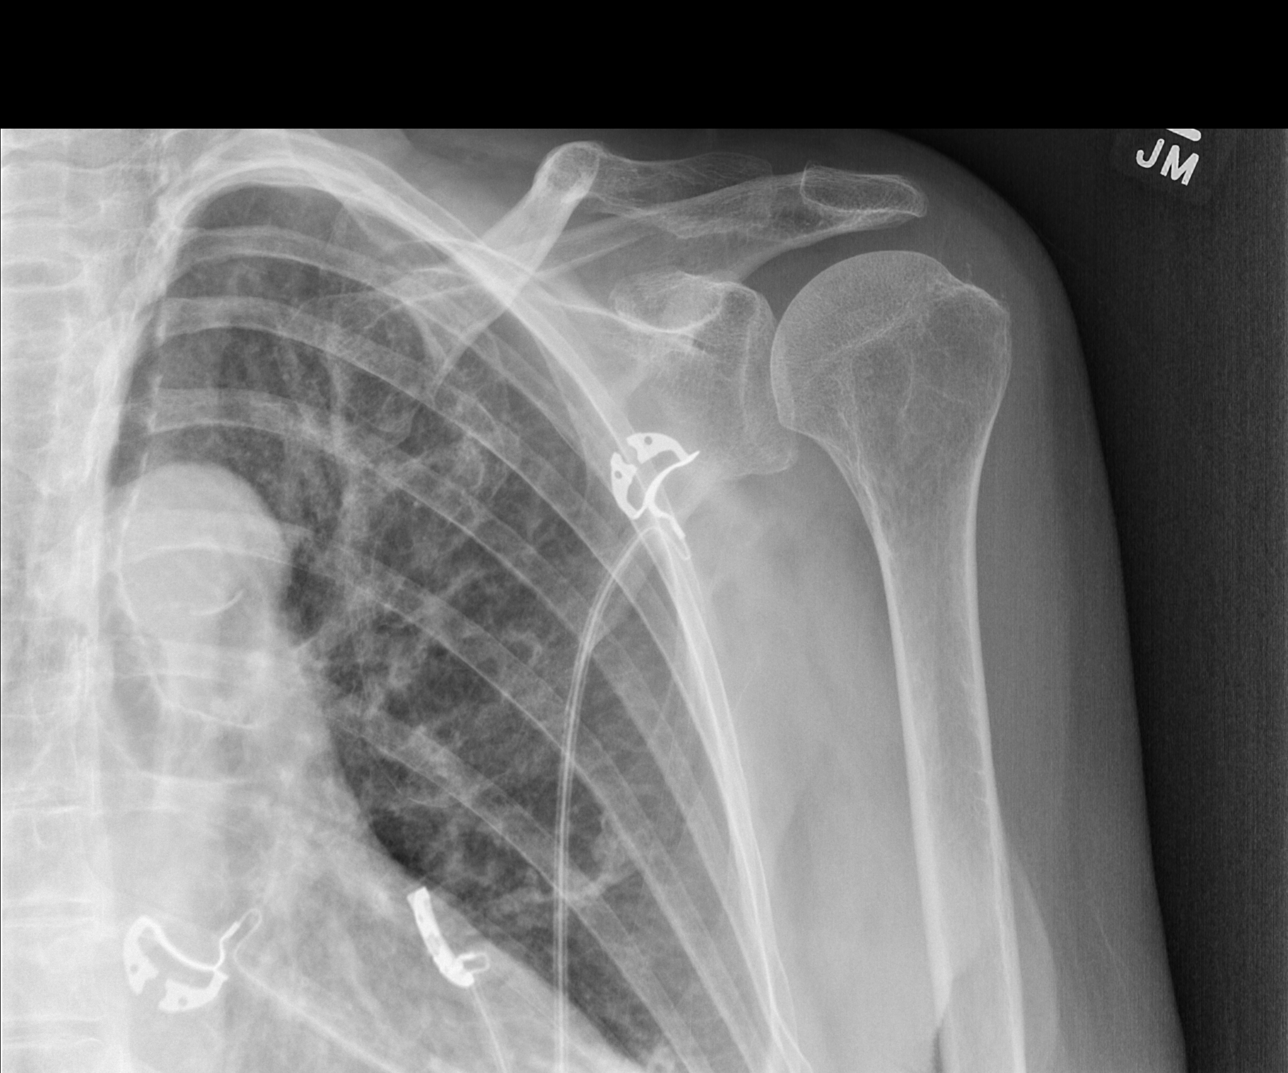

[w shoulder y view left]
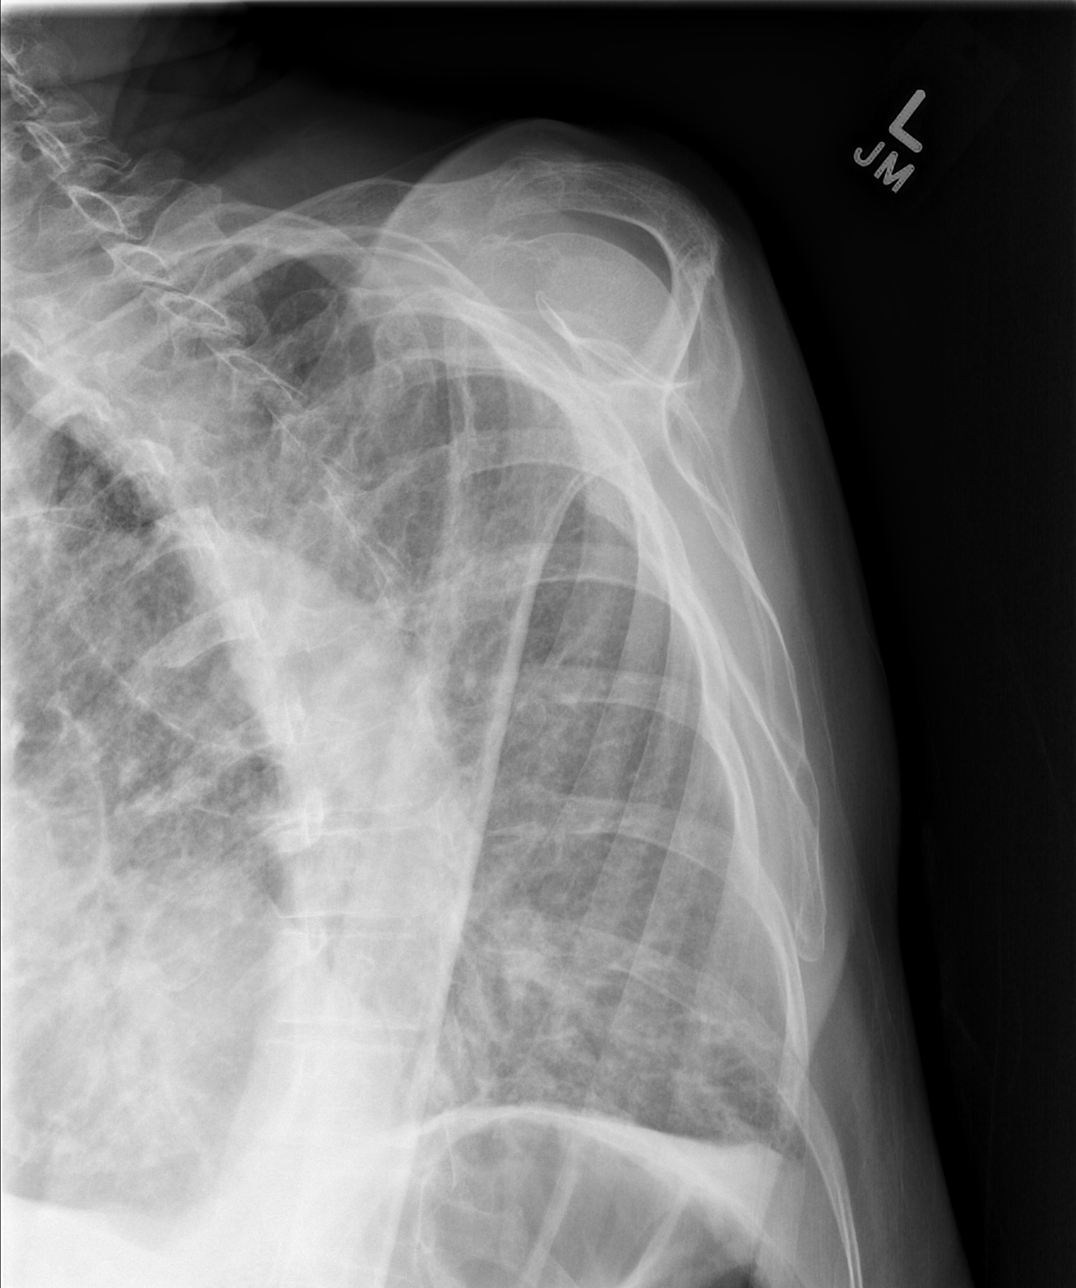

[w shoulder axillary left *]
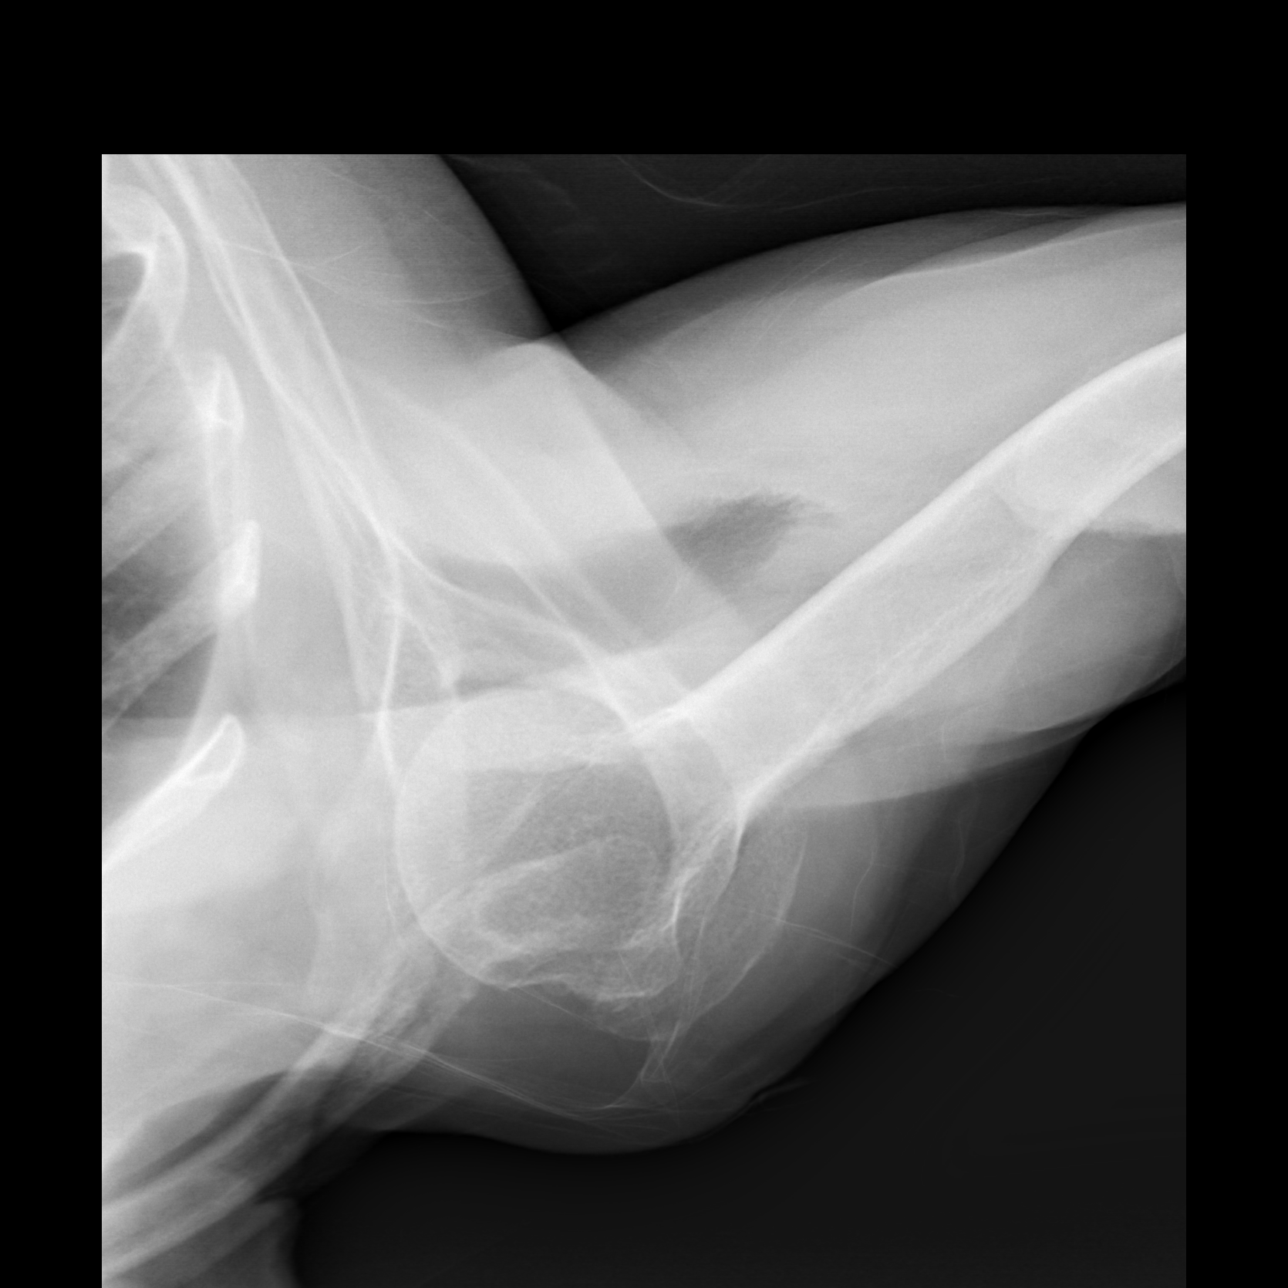

[3 of 3 positions shown; findings below may reference images not displayed]

FINDINGS: Glenohumeral joint is normal. AC joint is within normal limits. Tiny
calcifications associated with the distal rotator cuff could
indicate chronic calcific tendinopathy or minimal avulsion
fractures.
IMPRESSION: No evidence of dislocation or major fracture. Tiny calcifications
associated with the distal rotator cuff could be seen in chronic
tendinopathy or indicate minimal supraspinatus tendon avulsion
fractures.

## 2023-02-24 IMAGING — CT CT HEAD W/O CM
3 series · 15 of 47 positions shown, 18 images · non-contrast
Comparison: July 28, 2021.

CLINICAL DATA: Head trauma, minor (Age >= 65y)



[Series 2: head wo · axial · 0.46mm/px · z∈[-89,+41]mm · 9 of 32 slices shown, 12 images]
[im 3/32  brain]
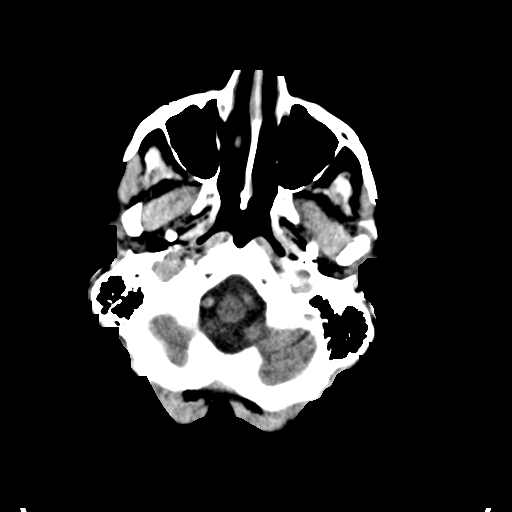
[im 3/32  bone]
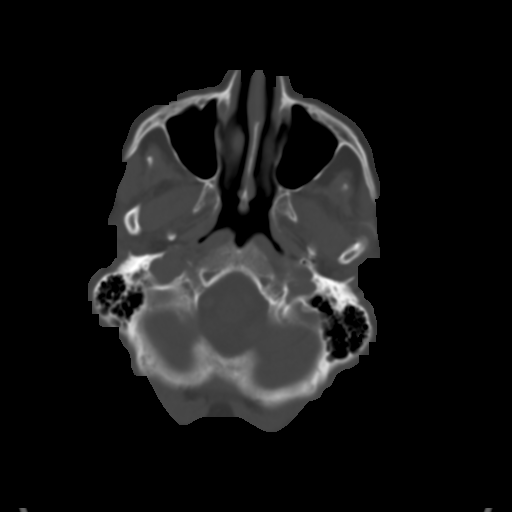
[im 6/32  brain]
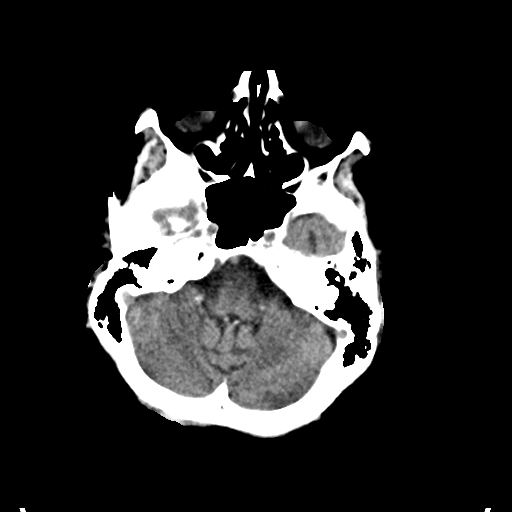
[im 9/32  brain]
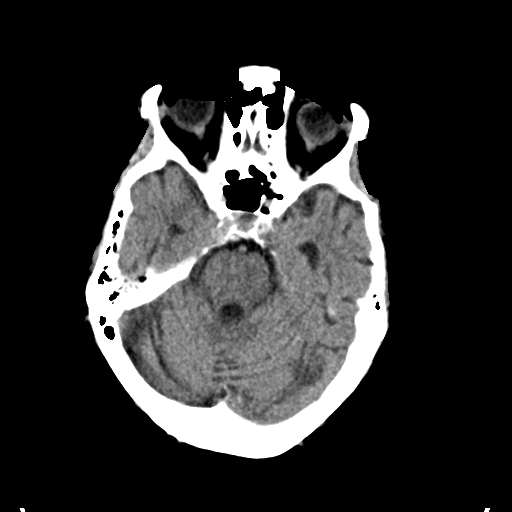
[im 12/32  brain]
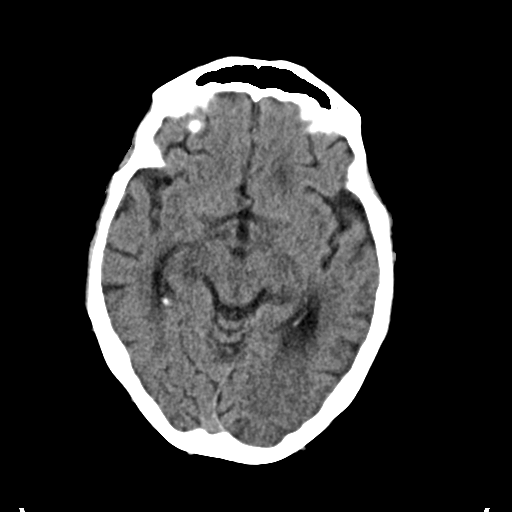
[im 17/32  brain]
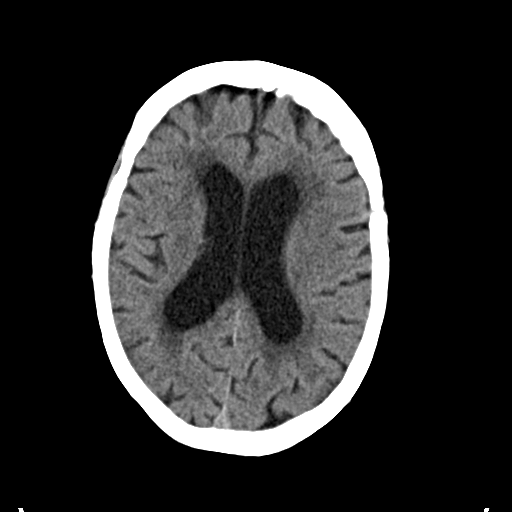
[im 17/32  bone]
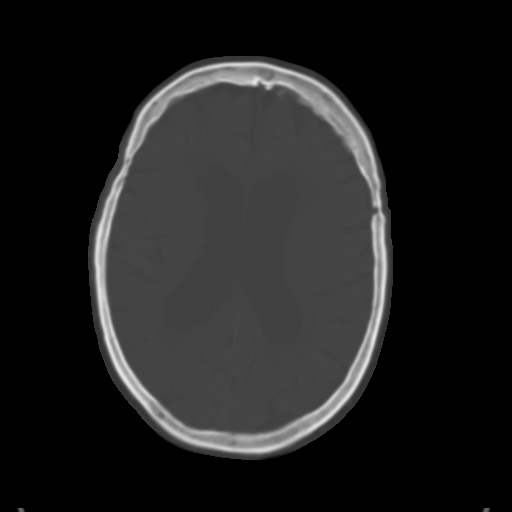
[im 20/32  brain]
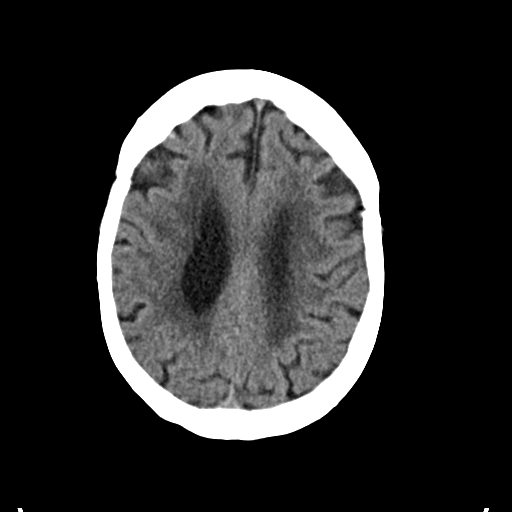
[im 23/32  brain]
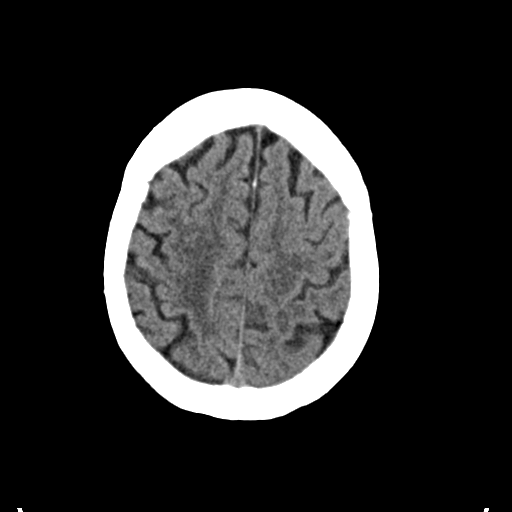
[im 26/32  brain]
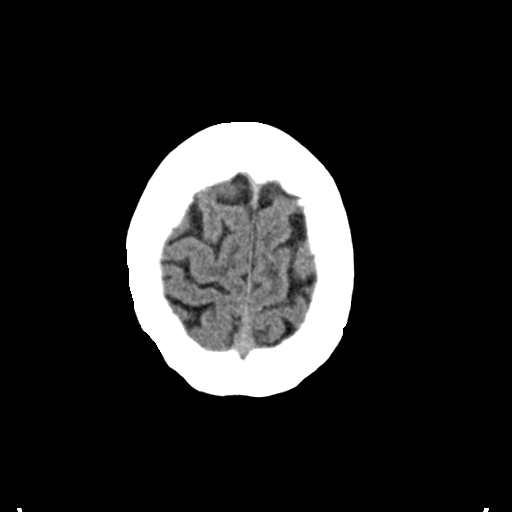
[im 29/32  brain]
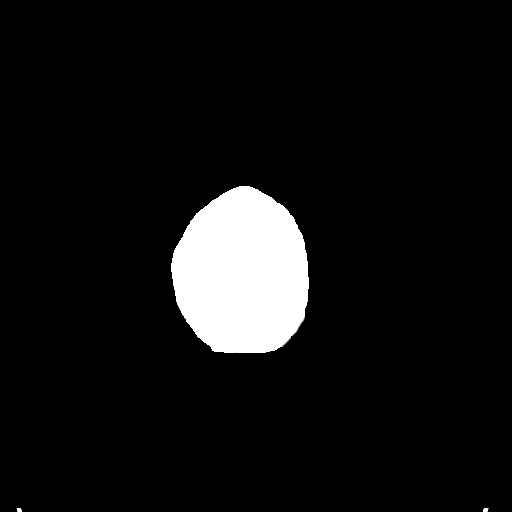
[im 29/32  bone]
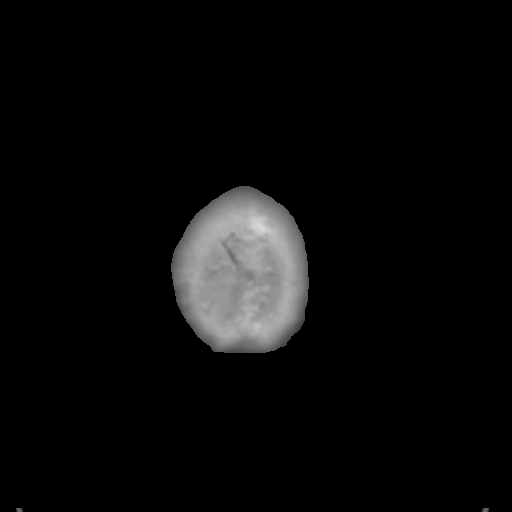

[Series 4: coronal soft · coronal · 0.28mm/px · 3 of 68 slices shown]
[im 23/68  brain]
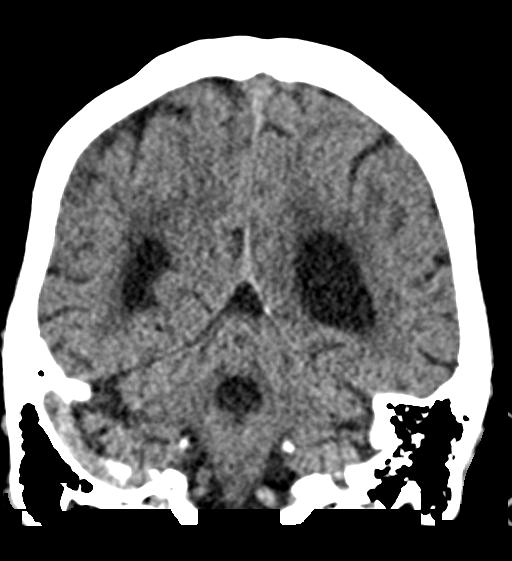
[im 30/68  brain]
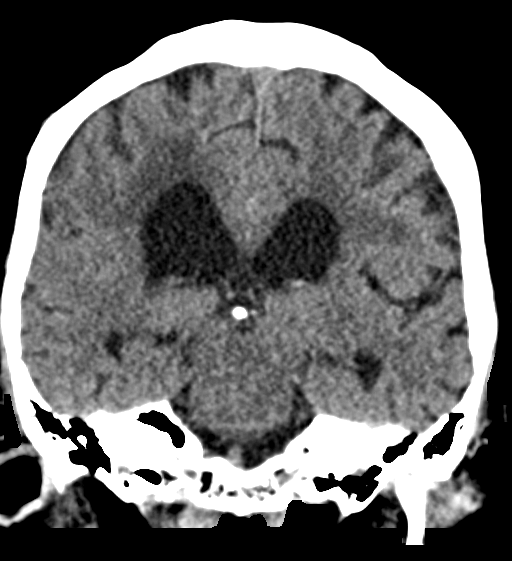
[im 38/68  brain]
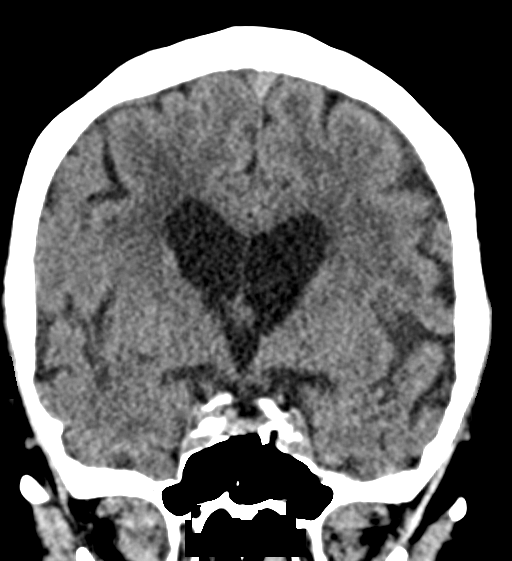

[Series 5: sag soft · sagittal · 0.31mm/px · 3 of 51 slices shown]
[im 17/51  brain]
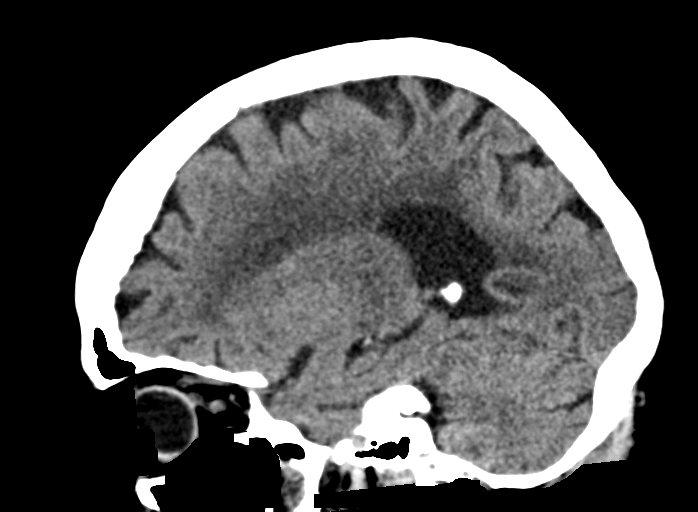
[im 26/51  brain]
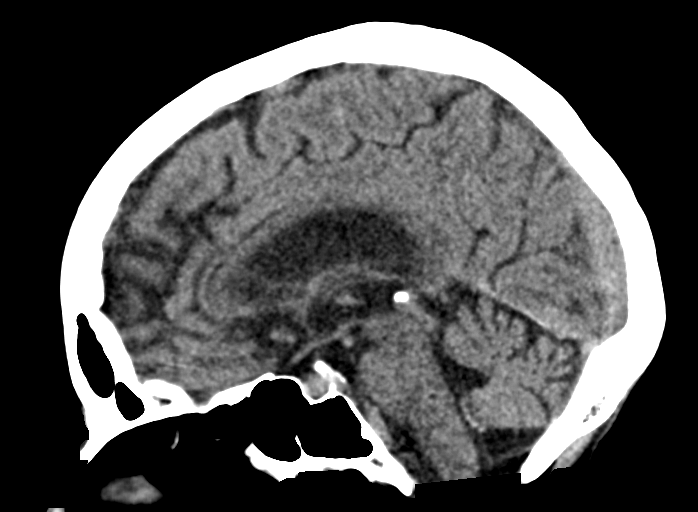
[im 34/51  brain]
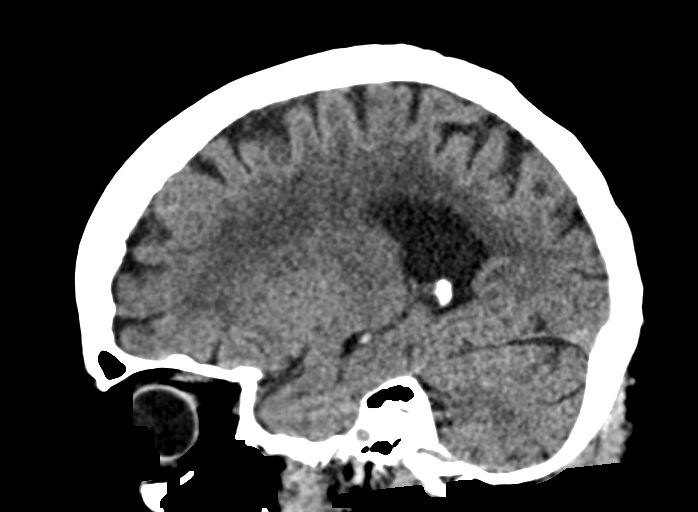

[15 of 47 positions shown; findings below may reference images not displayed]

FINDINGS: Brain: No evidence of acute infarction, hemorrhage, hydrocephalus,
extra-axial collection or mass lesion/mass effect. Patchy white
matter hypoattenuation, nonspecific but compatible with chronic
microvascular ischemic disease. Cerebral atrophy.

Vascular: No hyperdense vessel identified. Calcific intracranial
atherosclerosis.

Skull: No acute fracture.

Sinuses/Orbits: Clear sinuses.  No acute orbital findings.

Other: No mastoid effusions.
IMPRESSION: 1. No evidence of acute intracranial abnormality.
2. Chronic microvascular disease.
3.  Cerebral atrophy ([id number]).

## 2023-02-26 DIAGNOSIS — I129 Hypertensive chronic kidney disease with stage 1 through stage 4 chronic kidney disease, or unspecified chronic kidney disease: Secondary | ICD-10-CM | POA: Diagnosis not present

## 2023-02-26 DIAGNOSIS — G309 Alzheimer's disease, unspecified: Secondary | ICD-10-CM | POA: Diagnosis not present

## 2023-03-02 DIAGNOSIS — I129 Hypertensive chronic kidney disease with stage 1 through stage 4 chronic kidney disease, or unspecified chronic kidney disease: Secondary | ICD-10-CM | POA: Diagnosis not present

## 2023-03-02 DIAGNOSIS — G309 Alzheimer's disease, unspecified: Secondary | ICD-10-CM | POA: Diagnosis not present

## 2023-03-05 DIAGNOSIS — I129 Hypertensive chronic kidney disease with stage 1 through stage 4 chronic kidney disease, or unspecified chronic kidney disease: Secondary | ICD-10-CM | POA: Diagnosis not present

## 2023-03-05 DIAGNOSIS — G309 Alzheimer's disease, unspecified: Secondary | ICD-10-CM | POA: Diagnosis not present

## 2023-03-10 DIAGNOSIS — I129 Hypertensive chronic kidney disease with stage 1 through stage 4 chronic kidney disease, or unspecified chronic kidney disease: Secondary | ICD-10-CM | POA: Diagnosis not present

## 2023-03-10 DIAGNOSIS — G309 Alzheimer's disease, unspecified: Secondary | ICD-10-CM | POA: Diagnosis not present

## 2023-03-12 ENCOUNTER — Encounter (INDEPENDENT_AMBULATORY_CARE_PROVIDER_SITE_OTHER): Payer: Medicare Other | Admitting: Ophthalmology

## 2023-03-12 DIAGNOSIS — H43813 Vitreous degeneration, bilateral: Secondary | ICD-10-CM | POA: Diagnosis not present

## 2023-03-12 DIAGNOSIS — H353231 Exudative age-related macular degeneration, bilateral, with active choroidal neovascularization: Secondary | ICD-10-CM

## 2023-03-12 DIAGNOSIS — H33301 Unspecified retinal break, right eye: Secondary | ICD-10-CM | POA: Diagnosis not present

## 2023-03-13 DIAGNOSIS — G309 Alzheimer's disease, unspecified: Secondary | ICD-10-CM | POA: Diagnosis not present

## 2023-03-13 DIAGNOSIS — I129 Hypertensive chronic kidney disease with stage 1 through stage 4 chronic kidney disease, or unspecified chronic kidney disease: Secondary | ICD-10-CM | POA: Diagnosis not present

## 2023-03-17 DIAGNOSIS — G309 Alzheimer's disease, unspecified: Secondary | ICD-10-CM | POA: Diagnosis not present

## 2023-03-17 DIAGNOSIS — I129 Hypertensive chronic kidney disease with stage 1 through stage 4 chronic kidney disease, or unspecified chronic kidney disease: Secondary | ICD-10-CM | POA: Diagnosis not present

## 2023-03-19 DIAGNOSIS — I129 Hypertensive chronic kidney disease with stage 1 through stage 4 chronic kidney disease, or unspecified chronic kidney disease: Secondary | ICD-10-CM | POA: Diagnosis not present

## 2023-03-19 DIAGNOSIS — G309 Alzheimer's disease, unspecified: Secondary | ICD-10-CM | POA: Diagnosis not present

## 2023-03-24 DIAGNOSIS — I129 Hypertensive chronic kidney disease with stage 1 through stage 4 chronic kidney disease, or unspecified chronic kidney disease: Secondary | ICD-10-CM | POA: Diagnosis not present

## 2023-03-24 DIAGNOSIS — G309 Alzheimer's disease, unspecified: Secondary | ICD-10-CM | POA: Diagnosis not present

## 2023-03-26 DIAGNOSIS — G309 Alzheimer's disease, unspecified: Secondary | ICD-10-CM | POA: Diagnosis not present

## 2023-03-26 DIAGNOSIS — I129 Hypertensive chronic kidney disease with stage 1 through stage 4 chronic kidney disease, or unspecified chronic kidney disease: Secondary | ICD-10-CM | POA: Diagnosis not present

## 2023-03-31 DIAGNOSIS — I129 Hypertensive chronic kidney disease with stage 1 through stage 4 chronic kidney disease, or unspecified chronic kidney disease: Secondary | ICD-10-CM | POA: Diagnosis not present

## 2023-03-31 DIAGNOSIS — G309 Alzheimer's disease, unspecified: Secondary | ICD-10-CM | POA: Diagnosis not present

## 2023-04-06 DIAGNOSIS — G309 Alzheimer's disease, unspecified: Secondary | ICD-10-CM | POA: Diagnosis not present

## 2023-04-06 DIAGNOSIS — I129 Hypertensive chronic kidney disease with stage 1 through stage 4 chronic kidney disease, or unspecified chronic kidney disease: Secondary | ICD-10-CM | POA: Diagnosis not present

## 2023-04-17 DIAGNOSIS — G301 Alzheimer's disease with late onset: Secondary | ICD-10-CM | POA: Diagnosis not present

## 2023-04-17 DIAGNOSIS — M79672 Pain in left foot: Secondary | ICD-10-CM | POA: Diagnosis not present

## 2023-04-18 DIAGNOSIS — Z0001 Encounter for general adult medical examination with abnormal findings: Secondary | ICD-10-CM | POA: Diagnosis not present

## 2023-04-24 DIAGNOSIS — M25562 Pain in left knee: Secondary | ICD-10-CM | POA: Diagnosis not present

## 2023-04-24 DIAGNOSIS — S9002XA Contusion of left ankle, initial encounter: Secondary | ICD-10-CM | POA: Diagnosis not present

## 2023-04-24 DIAGNOSIS — M79662 Pain in left lower leg: Secondary | ICD-10-CM | POA: Diagnosis not present

## 2023-04-24 DIAGNOSIS — M79672 Pain in left foot: Secondary | ICD-10-CM | POA: Diagnosis not present

## 2023-04-24 DIAGNOSIS — M79605 Pain in left leg: Secondary | ICD-10-CM | POA: Diagnosis not present

## 2023-04-24 DIAGNOSIS — S8012XD Contusion of left lower leg, subsequent encounter: Secondary | ICD-10-CM | POA: Diagnosis not present

## 2023-04-29 DIAGNOSIS — S9032XD Contusion of left foot, subsequent encounter: Secondary | ICD-10-CM | POA: Diagnosis not present

## 2023-04-29 DIAGNOSIS — Z9181 History of falling: Secondary | ICD-10-CM | POA: Diagnosis not present

## 2023-04-29 DIAGNOSIS — G301 Alzheimer's disease with late onset: Secondary | ICD-10-CM | POA: Diagnosis not present

## 2023-05-06 DIAGNOSIS — G301 Alzheimer's disease with late onset: Secondary | ICD-10-CM | POA: Diagnosis not present

## 2023-05-06 DIAGNOSIS — Z9181 History of falling: Secondary | ICD-10-CM | POA: Diagnosis not present

## 2023-05-14 ENCOUNTER — Encounter (INDEPENDENT_AMBULATORY_CARE_PROVIDER_SITE_OTHER): Payer: Medicare Other | Admitting: Ophthalmology

## 2023-05-14 DIAGNOSIS — H33301 Unspecified retinal break, right eye: Secondary | ICD-10-CM | POA: Diagnosis not present

## 2023-05-14 DIAGNOSIS — H353231 Exudative age-related macular degeneration, bilateral, with active choroidal neovascularization: Secondary | ICD-10-CM

## 2023-05-14 DIAGNOSIS — H43813 Vitreous degeneration, bilateral: Secondary | ICD-10-CM | POA: Diagnosis not present

## 2023-05-20 DIAGNOSIS — R2681 Unsteadiness on feet: Secondary | ICD-10-CM | POA: Diagnosis not present

## 2023-05-20 DIAGNOSIS — M6281 Muscle weakness (generalized): Secondary | ICD-10-CM | POA: Diagnosis not present

## 2023-05-20 DIAGNOSIS — R269 Unspecified abnormalities of gait and mobility: Secondary | ICD-10-CM | POA: Diagnosis not present

## 2023-05-20 DIAGNOSIS — Z9181 History of falling: Secondary | ICD-10-CM | POA: Diagnosis not present

## 2023-05-20 DIAGNOSIS — R278 Other lack of coordination: Secondary | ICD-10-CM | POA: Diagnosis not present

## 2023-05-29 DIAGNOSIS — M6281 Muscle weakness (generalized): Secondary | ICD-10-CM | POA: Diagnosis not present

## 2023-05-29 DIAGNOSIS — R269 Unspecified abnormalities of gait and mobility: Secondary | ICD-10-CM | POA: Diagnosis not present

## 2023-05-29 DIAGNOSIS — Z9181 History of falling: Secondary | ICD-10-CM | POA: Diagnosis not present

## 2023-05-29 DIAGNOSIS — R2681 Unsteadiness on feet: Secondary | ICD-10-CM | POA: Diagnosis not present

## 2023-05-29 DIAGNOSIS — R278 Other lack of coordination: Secondary | ICD-10-CM | POA: Diagnosis not present

## 2023-06-01 DIAGNOSIS — R2681 Unsteadiness on feet: Secondary | ICD-10-CM | POA: Diagnosis not present

## 2023-06-01 DIAGNOSIS — M6281 Muscle weakness (generalized): Secondary | ICD-10-CM | POA: Diagnosis not present

## 2023-06-01 DIAGNOSIS — R278 Other lack of coordination: Secondary | ICD-10-CM | POA: Diagnosis not present

## 2023-06-01 DIAGNOSIS — R269 Unspecified abnormalities of gait and mobility: Secondary | ICD-10-CM | POA: Diagnosis not present

## 2023-06-01 DIAGNOSIS — Z9181 History of falling: Secondary | ICD-10-CM | POA: Diagnosis not present

## 2023-06-03 DIAGNOSIS — R2681 Unsteadiness on feet: Secondary | ICD-10-CM | POA: Diagnosis not present

## 2023-06-03 DIAGNOSIS — R278 Other lack of coordination: Secondary | ICD-10-CM | POA: Diagnosis not present

## 2023-06-03 DIAGNOSIS — R269 Unspecified abnormalities of gait and mobility: Secondary | ICD-10-CM | POA: Diagnosis not present

## 2023-06-03 DIAGNOSIS — M6281 Muscle weakness (generalized): Secondary | ICD-10-CM | POA: Diagnosis not present

## 2023-06-03 DIAGNOSIS — Z9181 History of falling: Secondary | ICD-10-CM | POA: Diagnosis not present

## 2023-06-04 DIAGNOSIS — M6281 Muscle weakness (generalized): Secondary | ICD-10-CM | POA: Diagnosis not present

## 2023-06-04 DIAGNOSIS — R278 Other lack of coordination: Secondary | ICD-10-CM | POA: Diagnosis not present

## 2023-06-04 DIAGNOSIS — R269 Unspecified abnormalities of gait and mobility: Secondary | ICD-10-CM | POA: Diagnosis not present

## 2023-06-04 DIAGNOSIS — Z9181 History of falling: Secondary | ICD-10-CM | POA: Diagnosis not present

## 2023-06-04 DIAGNOSIS — R2681 Unsteadiness on feet: Secondary | ICD-10-CM | POA: Diagnosis not present

## 2023-06-08 DIAGNOSIS — M6281 Muscle weakness (generalized): Secondary | ICD-10-CM | POA: Diagnosis not present

## 2023-06-08 DIAGNOSIS — R278 Other lack of coordination: Secondary | ICD-10-CM | POA: Diagnosis not present

## 2023-06-08 DIAGNOSIS — R2681 Unsteadiness on feet: Secondary | ICD-10-CM | POA: Diagnosis not present

## 2023-06-08 DIAGNOSIS — R269 Unspecified abnormalities of gait and mobility: Secondary | ICD-10-CM | POA: Diagnosis not present

## 2023-06-08 DIAGNOSIS — Z9181 History of falling: Secondary | ICD-10-CM | POA: Diagnosis not present

## 2023-06-10 DIAGNOSIS — R2681 Unsteadiness on feet: Secondary | ICD-10-CM | POA: Diagnosis not present

## 2023-06-10 DIAGNOSIS — R278 Other lack of coordination: Secondary | ICD-10-CM | POA: Diagnosis not present

## 2023-06-10 DIAGNOSIS — R269 Unspecified abnormalities of gait and mobility: Secondary | ICD-10-CM | POA: Diagnosis not present

## 2023-06-10 DIAGNOSIS — Z9181 History of falling: Secondary | ICD-10-CM | POA: Diagnosis not present

## 2023-06-10 DIAGNOSIS — M6281 Muscle weakness (generalized): Secondary | ICD-10-CM | POA: Diagnosis not present

## 2023-06-12 DIAGNOSIS — M6281 Muscle weakness (generalized): Secondary | ICD-10-CM | POA: Diagnosis not present

## 2023-06-12 DIAGNOSIS — R2681 Unsteadiness on feet: Secondary | ICD-10-CM | POA: Diagnosis not present

## 2023-06-12 DIAGNOSIS — Z9181 History of falling: Secondary | ICD-10-CM | POA: Diagnosis not present

## 2023-06-12 DIAGNOSIS — R269 Unspecified abnormalities of gait and mobility: Secondary | ICD-10-CM | POA: Diagnosis not present

## 2023-06-12 DIAGNOSIS — R278 Other lack of coordination: Secondary | ICD-10-CM | POA: Diagnosis not present

## 2023-06-14 DIAGNOSIS — R278 Other lack of coordination: Secondary | ICD-10-CM | POA: Diagnosis not present

## 2023-06-14 DIAGNOSIS — M6281 Muscle weakness (generalized): Secondary | ICD-10-CM | POA: Diagnosis not present

## 2023-06-14 DIAGNOSIS — R2681 Unsteadiness on feet: Secondary | ICD-10-CM | POA: Diagnosis not present

## 2023-06-14 DIAGNOSIS — Z9181 History of falling: Secondary | ICD-10-CM | POA: Diagnosis not present

## 2023-06-14 DIAGNOSIS — R269 Unspecified abnormalities of gait and mobility: Secondary | ICD-10-CM | POA: Diagnosis not present

## 2023-06-20 DIAGNOSIS — Z9181 History of falling: Secondary | ICD-10-CM | POA: Diagnosis not present

## 2023-06-20 DIAGNOSIS — R2681 Unsteadiness on feet: Secondary | ICD-10-CM | POA: Diagnosis not present

## 2023-06-20 DIAGNOSIS — R278 Other lack of coordination: Secondary | ICD-10-CM | POA: Diagnosis not present

## 2023-06-20 DIAGNOSIS — M6281 Muscle weakness (generalized): Secondary | ICD-10-CM | POA: Diagnosis not present

## 2023-06-20 DIAGNOSIS — R269 Unspecified abnormalities of gait and mobility: Secondary | ICD-10-CM | POA: Diagnosis not present

## 2023-06-21 DIAGNOSIS — M6281 Muscle weakness (generalized): Secondary | ICD-10-CM | POA: Diagnosis not present

## 2023-06-21 DIAGNOSIS — Z9181 History of falling: Secondary | ICD-10-CM | POA: Diagnosis not present

## 2023-06-21 DIAGNOSIS — R278 Other lack of coordination: Secondary | ICD-10-CM | POA: Diagnosis not present

## 2023-06-21 DIAGNOSIS — R269 Unspecified abnormalities of gait and mobility: Secondary | ICD-10-CM | POA: Diagnosis not present

## 2023-06-21 DIAGNOSIS — R2681 Unsteadiness on feet: Secondary | ICD-10-CM | POA: Diagnosis not present

## 2023-06-22 DIAGNOSIS — Z9181 History of falling: Secondary | ICD-10-CM | POA: Diagnosis not present

## 2023-06-22 DIAGNOSIS — R278 Other lack of coordination: Secondary | ICD-10-CM | POA: Diagnosis not present

## 2023-06-22 DIAGNOSIS — M6281 Muscle weakness (generalized): Secondary | ICD-10-CM | POA: Diagnosis not present

## 2023-06-22 DIAGNOSIS — R269 Unspecified abnormalities of gait and mobility: Secondary | ICD-10-CM | POA: Diagnosis not present

## 2023-06-22 DIAGNOSIS — R2681 Unsteadiness on feet: Secondary | ICD-10-CM | POA: Diagnosis not present

## 2023-06-27 DIAGNOSIS — R2681 Unsteadiness on feet: Secondary | ICD-10-CM | POA: Diagnosis not present

## 2023-06-27 DIAGNOSIS — R269 Unspecified abnormalities of gait and mobility: Secondary | ICD-10-CM | POA: Diagnosis not present

## 2023-06-27 DIAGNOSIS — Z9181 History of falling: Secondary | ICD-10-CM | POA: Diagnosis not present

## 2023-06-27 DIAGNOSIS — M6281 Muscle weakness (generalized): Secondary | ICD-10-CM | POA: Diagnosis not present

## 2023-06-27 DIAGNOSIS — R278 Other lack of coordination: Secondary | ICD-10-CM | POA: Diagnosis not present

## 2023-06-28 DIAGNOSIS — R2681 Unsteadiness on feet: Secondary | ICD-10-CM | POA: Diagnosis not present

## 2023-06-28 DIAGNOSIS — R269 Unspecified abnormalities of gait and mobility: Secondary | ICD-10-CM | POA: Diagnosis not present

## 2023-06-28 DIAGNOSIS — R278 Other lack of coordination: Secondary | ICD-10-CM | POA: Diagnosis not present

## 2023-06-28 DIAGNOSIS — M6281 Muscle weakness (generalized): Secondary | ICD-10-CM | POA: Diagnosis not present

## 2023-06-28 DIAGNOSIS — Z9181 History of falling: Secondary | ICD-10-CM | POA: Diagnosis not present

## 2023-06-29 DIAGNOSIS — R278 Other lack of coordination: Secondary | ICD-10-CM | POA: Diagnosis not present

## 2023-06-29 DIAGNOSIS — R2681 Unsteadiness on feet: Secondary | ICD-10-CM | POA: Diagnosis not present

## 2023-06-29 DIAGNOSIS — Z9181 History of falling: Secondary | ICD-10-CM | POA: Diagnosis not present

## 2023-06-29 DIAGNOSIS — M6281 Muscle weakness (generalized): Secondary | ICD-10-CM | POA: Diagnosis not present

## 2023-06-29 DIAGNOSIS — R269 Unspecified abnormalities of gait and mobility: Secondary | ICD-10-CM | POA: Diagnosis not present

## 2023-07-03 DIAGNOSIS — R269 Unspecified abnormalities of gait and mobility: Secondary | ICD-10-CM | POA: Diagnosis not present

## 2023-07-03 DIAGNOSIS — M6281 Muscle weakness (generalized): Secondary | ICD-10-CM | POA: Diagnosis not present

## 2023-07-03 DIAGNOSIS — R2681 Unsteadiness on feet: Secondary | ICD-10-CM | POA: Diagnosis not present

## 2023-07-03 DIAGNOSIS — R278 Other lack of coordination: Secondary | ICD-10-CM | POA: Diagnosis not present

## 2023-07-03 DIAGNOSIS — Z9181 History of falling: Secondary | ICD-10-CM | POA: Diagnosis not present

## 2023-07-05 DIAGNOSIS — R2681 Unsteadiness on feet: Secondary | ICD-10-CM | POA: Diagnosis not present

## 2023-07-05 DIAGNOSIS — R278 Other lack of coordination: Secondary | ICD-10-CM | POA: Diagnosis not present

## 2023-07-05 DIAGNOSIS — R269 Unspecified abnormalities of gait and mobility: Secondary | ICD-10-CM | POA: Diagnosis not present

## 2023-07-05 DIAGNOSIS — Z9181 History of falling: Secondary | ICD-10-CM | POA: Diagnosis not present

## 2023-07-05 DIAGNOSIS — M6281 Muscle weakness (generalized): Secondary | ICD-10-CM | POA: Diagnosis not present

## 2023-07-23 ENCOUNTER — Encounter (INDEPENDENT_AMBULATORY_CARE_PROVIDER_SITE_OTHER): Payer: Medicare Other | Admitting: Ophthalmology

## 2023-07-23 DIAGNOSIS — H43813 Vitreous degeneration, bilateral: Secondary | ICD-10-CM | POA: Diagnosis not present

## 2023-07-23 DIAGNOSIS — H353231 Exudative age-related macular degeneration, bilateral, with active choroidal neovascularization: Secondary | ICD-10-CM

## 2023-07-23 DIAGNOSIS — H33301 Unspecified retinal break, right eye: Secondary | ICD-10-CM

## 2023-08-21 DIAGNOSIS — N390 Urinary tract infection, site not specified: Secondary | ICD-10-CM | POA: Diagnosis not present

## 2023-09-10 DIAGNOSIS — E559 Vitamin D deficiency, unspecified: Secondary | ICD-10-CM | POA: Diagnosis not present

## 2023-10-01 ENCOUNTER — Encounter (INDEPENDENT_AMBULATORY_CARE_PROVIDER_SITE_OTHER): Payer: Medicare Other | Admitting: Ophthalmology

## 2023-10-01 ENCOUNTER — Encounter (INDEPENDENT_AMBULATORY_CARE_PROVIDER_SITE_OTHER): Admitting: Ophthalmology

## 2023-10-01 DIAGNOSIS — H43813 Vitreous degeneration, bilateral: Secondary | ICD-10-CM

## 2023-10-01 DIAGNOSIS — H353231 Exudative age-related macular degeneration, bilateral, with active choroidal neovascularization: Secondary | ICD-10-CM | POA: Diagnosis not present

## 2023-10-05 DIAGNOSIS — R2681 Unsteadiness on feet: Secondary | ICD-10-CM | POA: Diagnosis not present

## 2023-10-05 DIAGNOSIS — Z9181 History of falling: Secondary | ICD-10-CM | POA: Diagnosis not present

## 2023-10-05 DIAGNOSIS — M6281 Muscle weakness (generalized): Secondary | ICD-10-CM | POA: Diagnosis not present

## 2023-10-05 DIAGNOSIS — H18233 Secondary corneal edema, bilateral: Secondary | ICD-10-CM | POA: Diagnosis not present

## 2023-10-05 DIAGNOSIS — I4891 Unspecified atrial fibrillation: Secondary | ICD-10-CM | POA: Diagnosis not present

## 2023-10-09 DIAGNOSIS — Z9181 History of falling: Secondary | ICD-10-CM | POA: Diagnosis not present

## 2023-10-09 DIAGNOSIS — M6281 Muscle weakness (generalized): Secondary | ICD-10-CM | POA: Diagnosis not present

## 2023-10-09 DIAGNOSIS — R2681 Unsteadiness on feet: Secondary | ICD-10-CM | POA: Diagnosis not present

## 2023-10-09 DIAGNOSIS — I4891 Unspecified atrial fibrillation: Secondary | ICD-10-CM | POA: Diagnosis not present

## 2023-10-14 DIAGNOSIS — R2681 Unsteadiness on feet: Secondary | ICD-10-CM | POA: Diagnosis not present

## 2023-10-14 DIAGNOSIS — M6281 Muscle weakness (generalized): Secondary | ICD-10-CM | POA: Diagnosis not present

## 2023-10-14 DIAGNOSIS — Z9181 History of falling: Secondary | ICD-10-CM | POA: Diagnosis not present

## 2023-10-14 DIAGNOSIS — I4891 Unspecified atrial fibrillation: Secondary | ICD-10-CM | POA: Diagnosis not present

## 2023-10-15 DIAGNOSIS — I4891 Unspecified atrial fibrillation: Secondary | ICD-10-CM | POA: Diagnosis not present

## 2023-10-15 DIAGNOSIS — Z9181 History of falling: Secondary | ICD-10-CM | POA: Diagnosis not present

## 2023-10-15 DIAGNOSIS — R2681 Unsteadiness on feet: Secondary | ICD-10-CM | POA: Diagnosis not present

## 2023-10-15 DIAGNOSIS — M6281 Muscle weakness (generalized): Secondary | ICD-10-CM | POA: Diagnosis not present

## 2023-10-22 DIAGNOSIS — M6281 Muscle weakness (generalized): Secondary | ICD-10-CM | POA: Diagnosis not present

## 2023-10-22 DIAGNOSIS — R2681 Unsteadiness on feet: Secondary | ICD-10-CM | POA: Diagnosis not present

## 2023-10-22 DIAGNOSIS — I4891 Unspecified atrial fibrillation: Secondary | ICD-10-CM | POA: Diagnosis not present

## 2023-10-22 DIAGNOSIS — Z9181 History of falling: Secondary | ICD-10-CM | POA: Diagnosis not present

## 2023-10-24 DIAGNOSIS — I4891 Unspecified atrial fibrillation: Secondary | ICD-10-CM | POA: Diagnosis not present

## 2023-10-24 DIAGNOSIS — Z9181 History of falling: Secondary | ICD-10-CM | POA: Diagnosis not present

## 2023-10-24 DIAGNOSIS — M6281 Muscle weakness (generalized): Secondary | ICD-10-CM | POA: Diagnosis not present

## 2023-10-24 DIAGNOSIS — R2681 Unsteadiness on feet: Secondary | ICD-10-CM | POA: Diagnosis not present

## 2023-10-28 DIAGNOSIS — H18233 Secondary corneal edema, bilateral: Secondary | ICD-10-CM | POA: Diagnosis not present

## 2023-10-29 DIAGNOSIS — I4891 Unspecified atrial fibrillation: Secondary | ICD-10-CM | POA: Diagnosis not present

## 2023-10-29 DIAGNOSIS — Z9181 History of falling: Secondary | ICD-10-CM | POA: Diagnosis not present

## 2023-10-29 DIAGNOSIS — M6281 Muscle weakness (generalized): Secondary | ICD-10-CM | POA: Diagnosis not present

## 2023-10-29 DIAGNOSIS — R2681 Unsteadiness on feet: Secondary | ICD-10-CM | POA: Diagnosis not present

## 2023-10-30 DIAGNOSIS — Z9181 History of falling: Secondary | ICD-10-CM | POA: Diagnosis not present

## 2023-10-30 DIAGNOSIS — I4891 Unspecified atrial fibrillation: Secondary | ICD-10-CM | POA: Diagnosis not present

## 2023-10-30 DIAGNOSIS — R2681 Unsteadiness on feet: Secondary | ICD-10-CM | POA: Diagnosis not present

## 2023-10-30 DIAGNOSIS — M6281 Muscle weakness (generalized): Secondary | ICD-10-CM | POA: Diagnosis not present

## 2023-11-03 DIAGNOSIS — R2681 Unsteadiness on feet: Secondary | ICD-10-CM | POA: Diagnosis not present

## 2023-11-03 DIAGNOSIS — M6281 Muscle weakness (generalized): Secondary | ICD-10-CM | POA: Diagnosis not present

## 2023-11-03 DIAGNOSIS — Z9181 History of falling: Secondary | ICD-10-CM | POA: Diagnosis not present

## 2023-11-03 DIAGNOSIS — I4891 Unspecified atrial fibrillation: Secondary | ICD-10-CM | POA: Diagnosis not present

## 2023-11-05 DIAGNOSIS — Z9181 History of falling: Secondary | ICD-10-CM | POA: Diagnosis not present

## 2023-11-05 DIAGNOSIS — R2681 Unsteadiness on feet: Secondary | ICD-10-CM | POA: Diagnosis not present

## 2023-11-05 DIAGNOSIS — M6281 Muscle weakness (generalized): Secondary | ICD-10-CM | POA: Diagnosis not present

## 2023-11-05 DIAGNOSIS — I4891 Unspecified atrial fibrillation: Secondary | ICD-10-CM | POA: Diagnosis not present

## 2023-11-10 DIAGNOSIS — I4891 Unspecified atrial fibrillation: Secondary | ICD-10-CM | POA: Diagnosis not present

## 2023-11-10 DIAGNOSIS — R2681 Unsteadiness on feet: Secondary | ICD-10-CM | POA: Diagnosis not present

## 2023-11-10 DIAGNOSIS — M6281 Muscle weakness (generalized): Secondary | ICD-10-CM | POA: Diagnosis not present

## 2023-11-10 DIAGNOSIS — Z9181 History of falling: Secondary | ICD-10-CM | POA: Diagnosis not present

## 2023-11-12 DIAGNOSIS — M6281 Muscle weakness (generalized): Secondary | ICD-10-CM | POA: Diagnosis not present

## 2023-11-12 DIAGNOSIS — R2681 Unsteadiness on feet: Secondary | ICD-10-CM | POA: Diagnosis not present

## 2023-11-12 DIAGNOSIS — I4891 Unspecified atrial fibrillation: Secondary | ICD-10-CM | POA: Diagnosis not present

## 2023-11-12 DIAGNOSIS — Z9181 History of falling: Secondary | ICD-10-CM | POA: Diagnosis not present

## 2023-11-19 DIAGNOSIS — Z9181 History of falling: Secondary | ICD-10-CM | POA: Diagnosis not present

## 2023-11-19 DIAGNOSIS — M6281 Muscle weakness (generalized): Secondary | ICD-10-CM | POA: Diagnosis not present

## 2023-11-19 DIAGNOSIS — R2681 Unsteadiness on feet: Secondary | ICD-10-CM | POA: Diagnosis not present

## 2023-11-19 DIAGNOSIS — I4891 Unspecified atrial fibrillation: Secondary | ICD-10-CM | POA: Diagnosis not present

## 2023-11-24 DIAGNOSIS — M6281 Muscle weakness (generalized): Secondary | ICD-10-CM | POA: Diagnosis not present

## 2023-11-24 DIAGNOSIS — Z9181 History of falling: Secondary | ICD-10-CM | POA: Diagnosis not present

## 2023-11-24 DIAGNOSIS — R2681 Unsteadiness on feet: Secondary | ICD-10-CM | POA: Diagnosis not present

## 2023-11-24 DIAGNOSIS — I4891 Unspecified atrial fibrillation: Secondary | ICD-10-CM | POA: Diagnosis not present

## 2023-11-30 DIAGNOSIS — Z9181 History of falling: Secondary | ICD-10-CM | POA: Diagnosis not present

## 2023-11-30 DIAGNOSIS — R2681 Unsteadiness on feet: Secondary | ICD-10-CM | POA: Diagnosis not present

## 2023-11-30 DIAGNOSIS — M6281 Muscle weakness (generalized): Secondary | ICD-10-CM | POA: Diagnosis not present

## 2023-11-30 DIAGNOSIS — I4891 Unspecified atrial fibrillation: Secondary | ICD-10-CM | POA: Diagnosis not present

## 2023-12-17 ENCOUNTER — Encounter (INDEPENDENT_AMBULATORY_CARE_PROVIDER_SITE_OTHER): Admitting: Ophthalmology

## 2023-12-21 ENCOUNTER — Encounter (INDEPENDENT_AMBULATORY_CARE_PROVIDER_SITE_OTHER): Admitting: Ophthalmology

## 2023-12-21 DIAGNOSIS — H353231 Exudative age-related macular degeneration, bilateral, with active choroidal neovascularization: Secondary | ICD-10-CM

## 2023-12-21 DIAGNOSIS — H33301 Unspecified retinal break, right eye: Secondary | ICD-10-CM

## 2023-12-21 DIAGNOSIS — H43813 Vitreous degeneration, bilateral: Secondary | ICD-10-CM | POA: Diagnosis not present

## 2024-01-06 DIAGNOSIS — S81802A Unspecified open wound, left lower leg, initial encounter: Secondary | ICD-10-CM | POA: Diagnosis not present

## 2024-01-06 DIAGNOSIS — G301 Alzheimer's disease with late onset: Secondary | ICD-10-CM | POA: Diagnosis not present

## 2024-01-11 DIAGNOSIS — Z9181 History of falling: Secondary | ICD-10-CM | POA: Diagnosis not present

## 2024-01-11 DIAGNOSIS — I129 Hypertensive chronic kidney disease with stage 1 through stage 4 chronic kidney disease, or unspecified chronic kidney disease: Secondary | ICD-10-CM | POA: Diagnosis not present

## 2024-01-11 DIAGNOSIS — G4700 Insomnia, unspecified: Secondary | ICD-10-CM | POA: Diagnosis not present

## 2024-01-11 DIAGNOSIS — E559 Vitamin D deficiency, unspecified: Secondary | ICD-10-CM | POA: Diagnosis not present

## 2024-01-11 DIAGNOSIS — Z7952 Long term (current) use of systemic steroids: Secondary | ICD-10-CM | POA: Diagnosis not present

## 2024-01-11 DIAGNOSIS — I4891 Unspecified atrial fibrillation: Secondary | ICD-10-CM | POA: Diagnosis not present

## 2024-01-11 DIAGNOSIS — Z7982 Long term (current) use of aspirin: Secondary | ICD-10-CM | POA: Diagnosis not present

## 2024-01-11 DIAGNOSIS — M810 Age-related osteoporosis without current pathological fracture: Secondary | ICD-10-CM | POA: Diagnosis not present

## 2024-01-11 DIAGNOSIS — S81802D Unspecified open wound, left lower leg, subsequent encounter: Secondary | ICD-10-CM | POA: Diagnosis not present

## 2024-01-11 DIAGNOSIS — E039 Hypothyroidism, unspecified: Secondary | ICD-10-CM | POA: Diagnosis not present

## 2024-01-11 DIAGNOSIS — G301 Alzheimer's disease with late onset: Secondary | ICD-10-CM | POA: Diagnosis not present

## 2024-01-11 DIAGNOSIS — N1831 Chronic kidney disease, stage 3a: Secondary | ICD-10-CM | POA: Diagnosis not present

## 2024-01-15 DIAGNOSIS — E559 Vitamin D deficiency, unspecified: Secondary | ICD-10-CM | POA: Diagnosis not present

## 2024-01-15 DIAGNOSIS — G4700 Insomnia, unspecified: Secondary | ICD-10-CM | POA: Diagnosis not present

## 2024-01-15 DIAGNOSIS — N1831 Chronic kidney disease, stage 3a: Secondary | ICD-10-CM | POA: Diagnosis not present

## 2024-01-15 DIAGNOSIS — G301 Alzheimer's disease with late onset: Secondary | ICD-10-CM | POA: Diagnosis not present

## 2024-01-15 DIAGNOSIS — M810 Age-related osteoporosis without current pathological fracture: Secondary | ICD-10-CM | POA: Diagnosis not present

## 2024-01-15 DIAGNOSIS — Z7982 Long term (current) use of aspirin: Secondary | ICD-10-CM | POA: Diagnosis not present

## 2024-01-15 DIAGNOSIS — I4891 Unspecified atrial fibrillation: Secondary | ICD-10-CM | POA: Diagnosis not present

## 2024-01-15 DIAGNOSIS — Z9181 History of falling: Secondary | ICD-10-CM | POA: Diagnosis not present

## 2024-01-15 DIAGNOSIS — S81802D Unspecified open wound, left lower leg, subsequent encounter: Secondary | ICD-10-CM | POA: Diagnosis not present

## 2024-01-15 DIAGNOSIS — I129 Hypertensive chronic kidney disease with stage 1 through stage 4 chronic kidney disease, or unspecified chronic kidney disease: Secondary | ICD-10-CM | POA: Diagnosis not present

## 2024-01-15 DIAGNOSIS — Z7952 Long term (current) use of systemic steroids: Secondary | ICD-10-CM | POA: Diagnosis not present

## 2024-01-15 DIAGNOSIS — E039 Hypothyroidism, unspecified: Secondary | ICD-10-CM | POA: Diagnosis not present

## 2024-01-19 DIAGNOSIS — Z9181 History of falling: Secondary | ICD-10-CM | POA: Diagnosis not present

## 2024-01-19 DIAGNOSIS — G4700 Insomnia, unspecified: Secondary | ICD-10-CM | POA: Diagnosis not present

## 2024-01-19 DIAGNOSIS — N1831 Chronic kidney disease, stage 3a: Secondary | ICD-10-CM | POA: Diagnosis not present

## 2024-01-19 DIAGNOSIS — Z7952 Long term (current) use of systemic steroids: Secondary | ICD-10-CM | POA: Diagnosis not present

## 2024-01-19 DIAGNOSIS — I129 Hypertensive chronic kidney disease with stage 1 through stage 4 chronic kidney disease, or unspecified chronic kidney disease: Secondary | ICD-10-CM | POA: Diagnosis not present

## 2024-01-19 DIAGNOSIS — Z7982 Long term (current) use of aspirin: Secondary | ICD-10-CM | POA: Diagnosis not present

## 2024-01-19 DIAGNOSIS — E039 Hypothyroidism, unspecified: Secondary | ICD-10-CM | POA: Diagnosis not present

## 2024-01-19 DIAGNOSIS — S81802D Unspecified open wound, left lower leg, subsequent encounter: Secondary | ICD-10-CM | POA: Diagnosis not present

## 2024-01-19 DIAGNOSIS — G301 Alzheimer's disease with late onset: Secondary | ICD-10-CM | POA: Diagnosis not present

## 2024-01-19 DIAGNOSIS — E559 Vitamin D deficiency, unspecified: Secondary | ICD-10-CM | POA: Diagnosis not present

## 2024-01-19 DIAGNOSIS — I4891 Unspecified atrial fibrillation: Secondary | ICD-10-CM | POA: Diagnosis not present

## 2024-01-19 DIAGNOSIS — M810 Age-related osteoporosis without current pathological fracture: Secondary | ICD-10-CM | POA: Diagnosis not present

## 2024-01-21 DIAGNOSIS — Z7982 Long term (current) use of aspirin: Secondary | ICD-10-CM | POA: Diagnosis not present

## 2024-01-21 DIAGNOSIS — M810 Age-related osteoporosis without current pathological fracture: Secondary | ICD-10-CM | POA: Diagnosis not present

## 2024-01-21 DIAGNOSIS — S81802D Unspecified open wound, left lower leg, subsequent encounter: Secondary | ICD-10-CM | POA: Diagnosis not present

## 2024-01-21 DIAGNOSIS — Z7952 Long term (current) use of systemic steroids: Secondary | ICD-10-CM | POA: Diagnosis not present

## 2024-01-21 DIAGNOSIS — E559 Vitamin D deficiency, unspecified: Secondary | ICD-10-CM | POA: Diagnosis not present

## 2024-01-21 DIAGNOSIS — G4700 Insomnia, unspecified: Secondary | ICD-10-CM | POA: Diagnosis not present

## 2024-01-21 DIAGNOSIS — I129 Hypertensive chronic kidney disease with stage 1 through stage 4 chronic kidney disease, or unspecified chronic kidney disease: Secondary | ICD-10-CM | POA: Diagnosis not present

## 2024-01-21 DIAGNOSIS — E039 Hypothyroidism, unspecified: Secondary | ICD-10-CM | POA: Diagnosis not present

## 2024-01-21 DIAGNOSIS — Z9181 History of falling: Secondary | ICD-10-CM | POA: Diagnosis not present

## 2024-01-21 DIAGNOSIS — N1831 Chronic kidney disease, stage 3a: Secondary | ICD-10-CM | POA: Diagnosis not present

## 2024-01-21 DIAGNOSIS — I4891 Unspecified atrial fibrillation: Secondary | ICD-10-CM | POA: Diagnosis not present

## 2024-01-21 DIAGNOSIS — G301 Alzheimer's disease with late onset: Secondary | ICD-10-CM | POA: Diagnosis not present

## 2024-01-25 DIAGNOSIS — Z7952 Long term (current) use of systemic steroids: Secondary | ICD-10-CM | POA: Diagnosis not present

## 2024-01-25 DIAGNOSIS — G4700 Insomnia, unspecified: Secondary | ICD-10-CM | POA: Diagnosis not present

## 2024-01-25 DIAGNOSIS — Z9181 History of falling: Secondary | ICD-10-CM | POA: Diagnosis not present

## 2024-01-25 DIAGNOSIS — E039 Hypothyroidism, unspecified: Secondary | ICD-10-CM | POA: Diagnosis not present

## 2024-01-25 DIAGNOSIS — I129 Hypertensive chronic kidney disease with stage 1 through stage 4 chronic kidney disease, or unspecified chronic kidney disease: Secondary | ICD-10-CM | POA: Diagnosis not present

## 2024-01-25 DIAGNOSIS — S81802D Unspecified open wound, left lower leg, subsequent encounter: Secondary | ICD-10-CM | POA: Diagnosis not present

## 2024-01-25 DIAGNOSIS — E559 Vitamin D deficiency, unspecified: Secondary | ICD-10-CM | POA: Diagnosis not present

## 2024-01-25 DIAGNOSIS — G301 Alzheimer's disease with late onset: Secondary | ICD-10-CM | POA: Diagnosis not present

## 2024-01-25 DIAGNOSIS — M810 Age-related osteoporosis without current pathological fracture: Secondary | ICD-10-CM | POA: Diagnosis not present

## 2024-01-25 DIAGNOSIS — N1831 Chronic kidney disease, stage 3a: Secondary | ICD-10-CM | POA: Diagnosis not present

## 2024-01-25 DIAGNOSIS — I4891 Unspecified atrial fibrillation: Secondary | ICD-10-CM | POA: Diagnosis not present

## 2024-01-25 DIAGNOSIS — Z7982 Long term (current) use of aspirin: Secondary | ICD-10-CM | POA: Diagnosis not present

## 2024-01-28 DIAGNOSIS — Z7952 Long term (current) use of systemic steroids: Secondary | ICD-10-CM | POA: Diagnosis not present

## 2024-01-28 DIAGNOSIS — I129 Hypertensive chronic kidney disease with stage 1 through stage 4 chronic kidney disease, or unspecified chronic kidney disease: Secondary | ICD-10-CM | POA: Diagnosis not present

## 2024-01-28 DIAGNOSIS — G4700 Insomnia, unspecified: Secondary | ICD-10-CM | POA: Diagnosis not present

## 2024-01-28 DIAGNOSIS — I4891 Unspecified atrial fibrillation: Secondary | ICD-10-CM | POA: Diagnosis not present

## 2024-01-28 DIAGNOSIS — N1831 Chronic kidney disease, stage 3a: Secondary | ICD-10-CM | POA: Diagnosis not present

## 2024-01-28 DIAGNOSIS — G301 Alzheimer's disease with late onset: Secondary | ICD-10-CM | POA: Diagnosis not present

## 2024-01-28 DIAGNOSIS — Z7982 Long term (current) use of aspirin: Secondary | ICD-10-CM | POA: Diagnosis not present

## 2024-01-28 DIAGNOSIS — E559 Vitamin D deficiency, unspecified: Secondary | ICD-10-CM | POA: Diagnosis not present

## 2024-01-28 DIAGNOSIS — M810 Age-related osteoporosis without current pathological fracture: Secondary | ICD-10-CM | POA: Diagnosis not present

## 2024-01-28 DIAGNOSIS — S81802D Unspecified open wound, left lower leg, subsequent encounter: Secondary | ICD-10-CM | POA: Diagnosis not present

## 2024-01-28 DIAGNOSIS — Z9181 History of falling: Secondary | ICD-10-CM | POA: Diagnosis not present

## 2024-01-28 DIAGNOSIS — E039 Hypothyroidism, unspecified: Secondary | ICD-10-CM | POA: Diagnosis not present

## 2024-02-01 DIAGNOSIS — G4700 Insomnia, unspecified: Secondary | ICD-10-CM | POA: Diagnosis not present

## 2024-02-01 DIAGNOSIS — S81802D Unspecified open wound, left lower leg, subsequent encounter: Secondary | ICD-10-CM | POA: Diagnosis not present

## 2024-02-01 DIAGNOSIS — E039 Hypothyroidism, unspecified: Secondary | ICD-10-CM | POA: Diagnosis not present

## 2024-02-01 DIAGNOSIS — I4891 Unspecified atrial fibrillation: Secondary | ICD-10-CM | POA: Diagnosis not present

## 2024-02-01 DIAGNOSIS — I129 Hypertensive chronic kidney disease with stage 1 through stage 4 chronic kidney disease, or unspecified chronic kidney disease: Secondary | ICD-10-CM | POA: Diagnosis not present

## 2024-02-01 DIAGNOSIS — Z9181 History of falling: Secondary | ICD-10-CM | POA: Diagnosis not present

## 2024-02-01 DIAGNOSIS — G301 Alzheimer's disease with late onset: Secondary | ICD-10-CM | POA: Diagnosis not present

## 2024-02-01 DIAGNOSIS — N1831 Chronic kidney disease, stage 3a: Secondary | ICD-10-CM | POA: Diagnosis not present

## 2024-02-01 DIAGNOSIS — E559 Vitamin D deficiency, unspecified: Secondary | ICD-10-CM | POA: Diagnosis not present

## 2024-02-01 DIAGNOSIS — M810 Age-related osteoporosis without current pathological fracture: Secondary | ICD-10-CM | POA: Diagnosis not present

## 2024-02-01 DIAGNOSIS — Z7982 Long term (current) use of aspirin: Secondary | ICD-10-CM | POA: Diagnosis not present

## 2024-02-01 DIAGNOSIS — Z7952 Long term (current) use of systemic steroids: Secondary | ICD-10-CM | POA: Diagnosis not present

## 2024-02-04 DIAGNOSIS — Z7982 Long term (current) use of aspirin: Secondary | ICD-10-CM | POA: Diagnosis not present

## 2024-02-04 DIAGNOSIS — N1831 Chronic kidney disease, stage 3a: Secondary | ICD-10-CM | POA: Diagnosis not present

## 2024-02-04 DIAGNOSIS — I4891 Unspecified atrial fibrillation: Secondary | ICD-10-CM | POA: Diagnosis not present

## 2024-02-04 DIAGNOSIS — G301 Alzheimer's disease with late onset: Secondary | ICD-10-CM | POA: Diagnosis not present

## 2024-02-04 DIAGNOSIS — E559 Vitamin D deficiency, unspecified: Secondary | ICD-10-CM | POA: Diagnosis not present

## 2024-02-04 DIAGNOSIS — Z7952 Long term (current) use of systemic steroids: Secondary | ICD-10-CM | POA: Diagnosis not present

## 2024-02-04 DIAGNOSIS — I129 Hypertensive chronic kidney disease with stage 1 through stage 4 chronic kidney disease, or unspecified chronic kidney disease: Secondary | ICD-10-CM | POA: Diagnosis not present

## 2024-02-04 DIAGNOSIS — S81802D Unspecified open wound, left lower leg, subsequent encounter: Secondary | ICD-10-CM | POA: Diagnosis not present

## 2024-02-04 DIAGNOSIS — Z9181 History of falling: Secondary | ICD-10-CM | POA: Diagnosis not present

## 2024-02-04 DIAGNOSIS — E039 Hypothyroidism, unspecified: Secondary | ICD-10-CM | POA: Diagnosis not present

## 2024-02-04 DIAGNOSIS — G4700 Insomnia, unspecified: Secondary | ICD-10-CM | POA: Diagnosis not present

## 2024-02-04 DIAGNOSIS — M810 Age-related osteoporosis without current pathological fracture: Secondary | ICD-10-CM | POA: Diagnosis not present

## 2024-02-10 DIAGNOSIS — G4700 Insomnia, unspecified: Secondary | ICD-10-CM | POA: Diagnosis not present

## 2024-02-10 DIAGNOSIS — M810 Age-related osteoporosis without current pathological fracture: Secondary | ICD-10-CM | POA: Diagnosis not present

## 2024-02-10 DIAGNOSIS — G301 Alzheimer's disease with late onset: Secondary | ICD-10-CM | POA: Diagnosis not present

## 2024-02-10 DIAGNOSIS — N1831 Chronic kidney disease, stage 3a: Secondary | ICD-10-CM | POA: Diagnosis not present

## 2024-02-10 DIAGNOSIS — Z7952 Long term (current) use of systemic steroids: Secondary | ICD-10-CM | POA: Diagnosis not present

## 2024-02-10 DIAGNOSIS — I4891 Unspecified atrial fibrillation: Secondary | ICD-10-CM | POA: Diagnosis not present

## 2024-02-10 DIAGNOSIS — E559 Vitamin D deficiency, unspecified: Secondary | ICD-10-CM | POA: Diagnosis not present

## 2024-02-10 DIAGNOSIS — I129 Hypertensive chronic kidney disease with stage 1 through stage 4 chronic kidney disease, or unspecified chronic kidney disease: Secondary | ICD-10-CM | POA: Diagnosis not present

## 2024-02-10 DIAGNOSIS — Z7982 Long term (current) use of aspirin: Secondary | ICD-10-CM | POA: Diagnosis not present

## 2024-02-10 DIAGNOSIS — Z9181 History of falling: Secondary | ICD-10-CM | POA: Diagnosis not present

## 2024-02-10 DIAGNOSIS — E039 Hypothyroidism, unspecified: Secondary | ICD-10-CM | POA: Diagnosis not present

## 2024-02-10 DIAGNOSIS — S81802D Unspecified open wound, left lower leg, subsequent encounter: Secondary | ICD-10-CM | POA: Diagnosis not present

## 2024-02-12 DIAGNOSIS — M810 Age-related osteoporosis without current pathological fracture: Secondary | ICD-10-CM | POA: Diagnosis not present

## 2024-02-12 DIAGNOSIS — G301 Alzheimer's disease with late onset: Secondary | ICD-10-CM | POA: Diagnosis not present

## 2024-02-12 DIAGNOSIS — I129 Hypertensive chronic kidney disease with stage 1 through stage 4 chronic kidney disease, or unspecified chronic kidney disease: Secondary | ICD-10-CM | POA: Diagnosis not present

## 2024-02-12 DIAGNOSIS — Z9181 History of falling: Secondary | ICD-10-CM | POA: Diagnosis not present

## 2024-02-12 DIAGNOSIS — G4700 Insomnia, unspecified: Secondary | ICD-10-CM | POA: Diagnosis not present

## 2024-02-12 DIAGNOSIS — E559 Vitamin D deficiency, unspecified: Secondary | ICD-10-CM | POA: Diagnosis not present

## 2024-02-12 DIAGNOSIS — N1831 Chronic kidney disease, stage 3a: Secondary | ICD-10-CM | POA: Diagnosis not present

## 2024-02-12 DIAGNOSIS — Z7952 Long term (current) use of systemic steroids: Secondary | ICD-10-CM | POA: Diagnosis not present

## 2024-02-12 DIAGNOSIS — E039 Hypothyroidism, unspecified: Secondary | ICD-10-CM | POA: Diagnosis not present

## 2024-02-12 DIAGNOSIS — I4891 Unspecified atrial fibrillation: Secondary | ICD-10-CM | POA: Diagnosis not present

## 2024-02-12 DIAGNOSIS — Z7982 Long term (current) use of aspirin: Secondary | ICD-10-CM | POA: Diagnosis not present

## 2024-02-12 DIAGNOSIS — S81802D Unspecified open wound, left lower leg, subsequent encounter: Secondary | ICD-10-CM | POA: Diagnosis not present

## 2024-02-16 DIAGNOSIS — Z7982 Long term (current) use of aspirin: Secondary | ICD-10-CM | POA: Diagnosis not present

## 2024-02-16 DIAGNOSIS — S81802D Unspecified open wound, left lower leg, subsequent encounter: Secondary | ICD-10-CM | POA: Diagnosis not present

## 2024-02-16 DIAGNOSIS — G4700 Insomnia, unspecified: Secondary | ICD-10-CM | POA: Diagnosis not present

## 2024-02-16 DIAGNOSIS — Z9181 History of falling: Secondary | ICD-10-CM | POA: Diagnosis not present

## 2024-02-16 DIAGNOSIS — G301 Alzheimer's disease with late onset: Secondary | ICD-10-CM | POA: Diagnosis not present

## 2024-02-16 DIAGNOSIS — I4891 Unspecified atrial fibrillation: Secondary | ICD-10-CM | POA: Diagnosis not present

## 2024-02-16 DIAGNOSIS — M810 Age-related osteoporosis without current pathological fracture: Secondary | ICD-10-CM | POA: Diagnosis not present

## 2024-02-16 DIAGNOSIS — I129 Hypertensive chronic kidney disease with stage 1 through stage 4 chronic kidney disease, or unspecified chronic kidney disease: Secondary | ICD-10-CM | POA: Diagnosis not present

## 2024-02-16 DIAGNOSIS — E039 Hypothyroidism, unspecified: Secondary | ICD-10-CM | POA: Diagnosis not present

## 2024-02-16 DIAGNOSIS — N1831 Chronic kidney disease, stage 3a: Secondary | ICD-10-CM | POA: Diagnosis not present

## 2024-02-16 DIAGNOSIS — Z7952 Long term (current) use of systemic steroids: Secondary | ICD-10-CM | POA: Diagnosis not present

## 2024-02-16 DIAGNOSIS — E559 Vitamin D deficiency, unspecified: Secondary | ICD-10-CM | POA: Diagnosis not present

## 2024-02-19 DIAGNOSIS — E039 Hypothyroidism, unspecified: Secondary | ICD-10-CM | POA: Diagnosis not present

## 2024-02-19 DIAGNOSIS — N1831 Chronic kidney disease, stage 3a: Secondary | ICD-10-CM | POA: Diagnosis not present

## 2024-02-19 DIAGNOSIS — G4700 Insomnia, unspecified: Secondary | ICD-10-CM | POA: Diagnosis not present

## 2024-02-19 DIAGNOSIS — G301 Alzheimer's disease with late onset: Secondary | ICD-10-CM | POA: Diagnosis not present

## 2024-02-19 DIAGNOSIS — S81802D Unspecified open wound, left lower leg, subsequent encounter: Secondary | ICD-10-CM | POA: Diagnosis not present

## 2024-02-19 DIAGNOSIS — Z7982 Long term (current) use of aspirin: Secondary | ICD-10-CM | POA: Diagnosis not present

## 2024-02-19 DIAGNOSIS — H6123 Impacted cerumen, bilateral: Secondary | ICD-10-CM | POA: Diagnosis not present

## 2024-02-19 DIAGNOSIS — Z7952 Long term (current) use of systemic steroids: Secondary | ICD-10-CM | POA: Diagnosis not present

## 2024-02-19 DIAGNOSIS — I129 Hypertensive chronic kidney disease with stage 1 through stage 4 chronic kidney disease, or unspecified chronic kidney disease: Secondary | ICD-10-CM | POA: Diagnosis not present

## 2024-02-19 DIAGNOSIS — Z9181 History of falling: Secondary | ICD-10-CM | POA: Diagnosis not present

## 2024-02-19 DIAGNOSIS — E559 Vitamin D deficiency, unspecified: Secondary | ICD-10-CM | POA: Diagnosis not present

## 2024-02-19 DIAGNOSIS — M810 Age-related osteoporosis without current pathological fracture: Secondary | ICD-10-CM | POA: Diagnosis not present

## 2024-02-19 DIAGNOSIS — I4891 Unspecified atrial fibrillation: Secondary | ICD-10-CM | POA: Diagnosis not present

## 2024-02-23 DIAGNOSIS — N1831 Chronic kidney disease, stage 3a: Secondary | ICD-10-CM | POA: Diagnosis not present

## 2024-02-23 DIAGNOSIS — E039 Hypothyroidism, unspecified: Secondary | ICD-10-CM | POA: Diagnosis not present

## 2024-02-23 DIAGNOSIS — Z9181 History of falling: Secondary | ICD-10-CM | POA: Diagnosis not present

## 2024-02-23 DIAGNOSIS — I129 Hypertensive chronic kidney disease with stage 1 through stage 4 chronic kidney disease, or unspecified chronic kidney disease: Secondary | ICD-10-CM | POA: Diagnosis not present

## 2024-02-23 DIAGNOSIS — G4700 Insomnia, unspecified: Secondary | ICD-10-CM | POA: Diagnosis not present

## 2024-02-23 DIAGNOSIS — Z7982 Long term (current) use of aspirin: Secondary | ICD-10-CM | POA: Diagnosis not present

## 2024-02-23 DIAGNOSIS — S81802D Unspecified open wound, left lower leg, subsequent encounter: Secondary | ICD-10-CM | POA: Diagnosis not present

## 2024-02-23 DIAGNOSIS — G301 Alzheimer's disease with late onset: Secondary | ICD-10-CM | POA: Diagnosis not present

## 2024-02-23 DIAGNOSIS — Z7952 Long term (current) use of systemic steroids: Secondary | ICD-10-CM | POA: Diagnosis not present

## 2024-02-23 DIAGNOSIS — I4891 Unspecified atrial fibrillation: Secondary | ICD-10-CM | POA: Diagnosis not present

## 2024-02-23 DIAGNOSIS — E559 Vitamin D deficiency, unspecified: Secondary | ICD-10-CM | POA: Diagnosis not present

## 2024-02-23 DIAGNOSIS — M810 Age-related osteoporosis without current pathological fracture: Secondary | ICD-10-CM | POA: Diagnosis not present

## 2024-02-26 DIAGNOSIS — G4700 Insomnia, unspecified: Secondary | ICD-10-CM | POA: Diagnosis not present

## 2024-02-26 DIAGNOSIS — Z7982 Long term (current) use of aspirin: Secondary | ICD-10-CM | POA: Diagnosis not present

## 2024-02-26 DIAGNOSIS — G301 Alzheimer's disease with late onset: Secondary | ICD-10-CM | POA: Diagnosis not present

## 2024-02-26 DIAGNOSIS — Z7952 Long term (current) use of systemic steroids: Secondary | ICD-10-CM | POA: Diagnosis not present

## 2024-02-26 DIAGNOSIS — E559 Vitamin D deficiency, unspecified: Secondary | ICD-10-CM | POA: Diagnosis not present

## 2024-02-26 DIAGNOSIS — I129 Hypertensive chronic kidney disease with stage 1 through stage 4 chronic kidney disease, or unspecified chronic kidney disease: Secondary | ICD-10-CM | POA: Diagnosis not present

## 2024-02-26 DIAGNOSIS — S81802D Unspecified open wound, left lower leg, subsequent encounter: Secondary | ICD-10-CM | POA: Diagnosis not present

## 2024-02-26 DIAGNOSIS — M810 Age-related osteoporosis without current pathological fracture: Secondary | ICD-10-CM | POA: Diagnosis not present

## 2024-02-26 DIAGNOSIS — E039 Hypothyroidism, unspecified: Secondary | ICD-10-CM | POA: Diagnosis not present

## 2024-02-26 DIAGNOSIS — I4891 Unspecified atrial fibrillation: Secondary | ICD-10-CM | POA: Diagnosis not present

## 2024-02-26 DIAGNOSIS — N1831 Chronic kidney disease, stage 3a: Secondary | ICD-10-CM | POA: Diagnosis not present

## 2024-02-26 DIAGNOSIS — Z9181 History of falling: Secondary | ICD-10-CM | POA: Diagnosis not present

## 2024-02-29 DIAGNOSIS — S81802D Unspecified open wound, left lower leg, subsequent encounter: Secondary | ICD-10-CM | POA: Diagnosis not present

## 2024-02-29 DIAGNOSIS — I4891 Unspecified atrial fibrillation: Secondary | ICD-10-CM | POA: Diagnosis not present

## 2024-02-29 DIAGNOSIS — E559 Vitamin D deficiency, unspecified: Secondary | ICD-10-CM | POA: Diagnosis not present

## 2024-02-29 DIAGNOSIS — I129 Hypertensive chronic kidney disease with stage 1 through stage 4 chronic kidney disease, or unspecified chronic kidney disease: Secondary | ICD-10-CM | POA: Diagnosis not present

## 2024-02-29 DIAGNOSIS — Z7982 Long term (current) use of aspirin: Secondary | ICD-10-CM | POA: Diagnosis not present

## 2024-02-29 DIAGNOSIS — G4700 Insomnia, unspecified: Secondary | ICD-10-CM | POA: Diagnosis not present

## 2024-02-29 DIAGNOSIS — Z7952 Long term (current) use of systemic steroids: Secondary | ICD-10-CM | POA: Diagnosis not present

## 2024-02-29 DIAGNOSIS — N1831 Chronic kidney disease, stage 3a: Secondary | ICD-10-CM | POA: Diagnosis not present

## 2024-02-29 DIAGNOSIS — M810 Age-related osteoporosis without current pathological fracture: Secondary | ICD-10-CM | POA: Diagnosis not present

## 2024-02-29 DIAGNOSIS — E039 Hypothyroidism, unspecified: Secondary | ICD-10-CM | POA: Diagnosis not present

## 2024-02-29 DIAGNOSIS — G301 Alzheimer's disease with late onset: Secondary | ICD-10-CM | POA: Diagnosis not present

## 2024-02-29 DIAGNOSIS — Z9181 History of falling: Secondary | ICD-10-CM | POA: Diagnosis not present

## 2024-03-08 DIAGNOSIS — S91302A Unspecified open wound, left foot, initial encounter: Secondary | ICD-10-CM | POA: Diagnosis not present

## 2024-03-08 DIAGNOSIS — H6123 Impacted cerumen, bilateral: Secondary | ICD-10-CM | POA: Diagnosis not present

## 2024-03-11 DIAGNOSIS — I4891 Unspecified atrial fibrillation: Secondary | ICD-10-CM | POA: Diagnosis not present

## 2024-03-11 DIAGNOSIS — L89890 Pressure ulcer of other site, unstageable: Secondary | ICD-10-CM | POA: Diagnosis not present

## 2024-03-11 DIAGNOSIS — Z9181 History of falling: Secondary | ICD-10-CM | POA: Diagnosis not present

## 2024-03-11 DIAGNOSIS — M810 Age-related osteoporosis without current pathological fracture: Secondary | ICD-10-CM | POA: Diagnosis not present

## 2024-03-11 DIAGNOSIS — N183 Chronic kidney disease, stage 3 unspecified: Secondary | ICD-10-CM | POA: Diagnosis not present

## 2024-03-11 DIAGNOSIS — E039 Hypothyroidism, unspecified: Secondary | ICD-10-CM | POA: Diagnosis not present

## 2024-03-11 DIAGNOSIS — G309 Alzheimer's disease, unspecified: Secondary | ICD-10-CM | POA: Diagnosis not present

## 2024-03-11 DIAGNOSIS — I129 Hypertensive chronic kidney disease with stage 1 through stage 4 chronic kidney disease, or unspecified chronic kidney disease: Secondary | ICD-10-CM | POA: Diagnosis not present

## 2024-03-11 DIAGNOSIS — Z7982 Long term (current) use of aspirin: Secondary | ICD-10-CM | POA: Diagnosis not present

## 2024-03-11 DIAGNOSIS — G4700 Insomnia, unspecified: Secondary | ICD-10-CM | POA: Diagnosis not present

## 2024-03-11 DIAGNOSIS — H3530 Unspecified macular degeneration: Secondary | ICD-10-CM | POA: Diagnosis not present

## 2024-03-14 DIAGNOSIS — Z9181 History of falling: Secondary | ICD-10-CM | POA: Diagnosis not present

## 2024-03-14 DIAGNOSIS — I129 Hypertensive chronic kidney disease with stage 1 through stage 4 chronic kidney disease, or unspecified chronic kidney disease: Secondary | ICD-10-CM | POA: Diagnosis not present

## 2024-03-14 DIAGNOSIS — N183 Chronic kidney disease, stage 3 unspecified: Secondary | ICD-10-CM | POA: Diagnosis not present

## 2024-03-14 DIAGNOSIS — G309 Alzheimer's disease, unspecified: Secondary | ICD-10-CM | POA: Diagnosis not present

## 2024-03-14 DIAGNOSIS — H3530 Unspecified macular degeneration: Secondary | ICD-10-CM | POA: Diagnosis not present

## 2024-03-14 DIAGNOSIS — E039 Hypothyroidism, unspecified: Secondary | ICD-10-CM | POA: Diagnosis not present

## 2024-03-14 DIAGNOSIS — I4891 Unspecified atrial fibrillation: Secondary | ICD-10-CM | POA: Diagnosis not present

## 2024-03-14 DIAGNOSIS — M810 Age-related osteoporosis without current pathological fracture: Secondary | ICD-10-CM | POA: Diagnosis not present

## 2024-03-14 DIAGNOSIS — Z7982 Long term (current) use of aspirin: Secondary | ICD-10-CM | POA: Diagnosis not present

## 2024-03-14 DIAGNOSIS — G4700 Insomnia, unspecified: Secondary | ICD-10-CM | POA: Diagnosis not present

## 2024-03-14 DIAGNOSIS — L89890 Pressure ulcer of other site, unstageable: Secondary | ICD-10-CM | POA: Diagnosis not present

## 2024-03-17 DIAGNOSIS — G4700 Insomnia, unspecified: Secondary | ICD-10-CM | POA: Diagnosis not present

## 2024-03-17 DIAGNOSIS — H3530 Unspecified macular degeneration: Secondary | ICD-10-CM | POA: Diagnosis not present

## 2024-03-17 DIAGNOSIS — N183 Chronic kidney disease, stage 3 unspecified: Secondary | ICD-10-CM | POA: Diagnosis not present

## 2024-03-17 DIAGNOSIS — E039 Hypothyroidism, unspecified: Secondary | ICD-10-CM | POA: Diagnosis not present

## 2024-03-17 DIAGNOSIS — I4891 Unspecified atrial fibrillation: Secondary | ICD-10-CM | POA: Diagnosis not present

## 2024-03-17 DIAGNOSIS — Z7982 Long term (current) use of aspirin: Secondary | ICD-10-CM | POA: Diagnosis not present

## 2024-03-17 DIAGNOSIS — L89890 Pressure ulcer of other site, unstageable: Secondary | ICD-10-CM | POA: Diagnosis not present

## 2024-03-17 DIAGNOSIS — Z9181 History of falling: Secondary | ICD-10-CM | POA: Diagnosis not present

## 2024-03-17 DIAGNOSIS — G309 Alzheimer's disease, unspecified: Secondary | ICD-10-CM | POA: Diagnosis not present

## 2024-03-17 DIAGNOSIS — I129 Hypertensive chronic kidney disease with stage 1 through stage 4 chronic kidney disease, or unspecified chronic kidney disease: Secondary | ICD-10-CM | POA: Diagnosis not present

## 2024-03-17 DIAGNOSIS — M810 Age-related osteoporosis without current pathological fracture: Secondary | ICD-10-CM | POA: Diagnosis not present

## 2024-03-21 DIAGNOSIS — Z9181 History of falling: Secondary | ICD-10-CM | POA: Diagnosis not present

## 2024-03-21 DIAGNOSIS — H3530 Unspecified macular degeneration: Secondary | ICD-10-CM | POA: Diagnosis not present

## 2024-03-21 DIAGNOSIS — I129 Hypertensive chronic kidney disease with stage 1 through stage 4 chronic kidney disease, or unspecified chronic kidney disease: Secondary | ICD-10-CM | POA: Diagnosis not present

## 2024-03-21 DIAGNOSIS — L89890 Pressure ulcer of other site, unstageable: Secondary | ICD-10-CM | POA: Diagnosis not present

## 2024-03-21 DIAGNOSIS — M810 Age-related osteoporosis without current pathological fracture: Secondary | ICD-10-CM | POA: Diagnosis not present

## 2024-03-21 DIAGNOSIS — G4700 Insomnia, unspecified: Secondary | ICD-10-CM | POA: Diagnosis not present

## 2024-03-21 DIAGNOSIS — I4891 Unspecified atrial fibrillation: Secondary | ICD-10-CM | POA: Diagnosis not present

## 2024-03-21 DIAGNOSIS — N183 Chronic kidney disease, stage 3 unspecified: Secondary | ICD-10-CM | POA: Diagnosis not present

## 2024-03-21 DIAGNOSIS — Z7982 Long term (current) use of aspirin: Secondary | ICD-10-CM | POA: Diagnosis not present

## 2024-03-21 DIAGNOSIS — E039 Hypothyroidism, unspecified: Secondary | ICD-10-CM | POA: Diagnosis not present

## 2024-03-21 DIAGNOSIS — G309 Alzheimer's disease, unspecified: Secondary | ICD-10-CM | POA: Diagnosis not present

## 2024-12-26 ENCOUNTER — Encounter (INDEPENDENT_AMBULATORY_CARE_PROVIDER_SITE_OTHER): Admitting: Ophthalmology

## 2024-12-27 ENCOUNTER — Encounter (INDEPENDENT_AMBULATORY_CARE_PROVIDER_SITE_OTHER): Admitting: Ophthalmology
